# Patient Record
Sex: Female | Born: 1970
Health system: Southern US, Community
[De-identification: ages and names within clinical notes are randomized; demographics above are authoritative.]

## PROBLEM LIST (undated history)

## (undated) DIAGNOSIS — M8580 Other specified disorders of bone density and structure, unspecified site: Secondary | ICD-10-CM

## (undated) DIAGNOSIS — M545 Low back pain, unspecified: Secondary | ICD-10-CM

## (undated) DIAGNOSIS — J329 Chronic sinusitis, unspecified: Secondary | ICD-10-CM

## (undated) DIAGNOSIS — G35 Multiple sclerosis: Secondary | ICD-10-CM

## (undated) HISTORY — DX: Low back pain: M54.5

## (undated) HISTORY — DX: Chronic sinusitis, unspecified: J32.9

## (undated) HISTORY — DX: Other specified disorders of bone density and structure, unspecified site: M85.80

## (undated) HISTORY — DX: Low back pain, unspecified: M54.50

---

## 2001-02-24 ENCOUNTER — Other Ambulatory Visit: Admission: RE | Admit: 2001-02-24 | Discharge: 2001-02-24 | Payer: Self-pay | Admitting: Obstetrics and Gynecology

## 2002-03-06 ENCOUNTER — Inpatient Hospital Stay (HOSPITAL_COMMUNITY): Admission: AD | Admit: 2002-03-06 | Discharge: 2002-03-09 | Payer: Self-pay | Admitting: Obstetrics and Gynecology

## 2002-03-10 ENCOUNTER — Encounter: Admission: RE | Admit: 2002-03-10 | Discharge: 2002-04-09 | Payer: Self-pay | Admitting: Obstetrics and Gynecology

## 2002-04-21 ENCOUNTER — Other Ambulatory Visit: Admission: RE | Admit: 2002-04-21 | Discharge: 2002-04-21 | Payer: Self-pay | Admitting: Obstetrics and Gynecology

## 2002-05-10 ENCOUNTER — Encounter: Admission: RE | Admit: 2002-05-10 | Discharge: 2002-06-09 | Payer: Self-pay | Admitting: Obstetrics and Gynecology

## 2003-05-11 ENCOUNTER — Other Ambulatory Visit: Admission: RE | Admit: 2003-05-11 | Discharge: 2003-05-11 | Payer: Self-pay | Admitting: Obstetrics and Gynecology

## 2004-03-31 ENCOUNTER — Inpatient Hospital Stay: Payer: Self-pay | Admitting: Obstetrics and Gynecology

## 2006-01-06 ENCOUNTER — Ambulatory Visit: Payer: Self-pay | Admitting: Obstetrics and Gynecology

## 2006-08-07 ENCOUNTER — Ambulatory Visit: Payer: Self-pay | Admitting: Psychiatry

## 2008-08-23 ENCOUNTER — Ambulatory Visit: Payer: Self-pay

## 2009-08-24 ENCOUNTER — Encounter: Payer: Self-pay | Admitting: Internal Medicine

## 2009-09-12 ENCOUNTER — Encounter: Payer: Self-pay | Admitting: Internal Medicine

## 2009-10-12 ENCOUNTER — Encounter: Payer: Self-pay | Admitting: Internal Medicine

## 2009-11-09 ENCOUNTER — Ambulatory Visit: Payer: Self-pay

## 2009-11-12 ENCOUNTER — Encounter: Payer: Self-pay | Admitting: Internal Medicine

## 2009-12-13 ENCOUNTER — Encounter: Payer: Self-pay | Admitting: Internal Medicine

## 2010-02-12 ENCOUNTER — Encounter: Payer: Self-pay | Admitting: Internal Medicine

## 2010-04-14 ENCOUNTER — Encounter: Payer: Self-pay | Admitting: Internal Medicine

## 2011-06-25 ENCOUNTER — Ambulatory Visit (INDEPENDENT_AMBULATORY_CARE_PROVIDER_SITE_OTHER): Payer: PRIVATE HEALTH INSURANCE | Admitting: Internal Medicine

## 2011-06-25 ENCOUNTER — Encounter: Payer: Self-pay | Admitting: Internal Medicine

## 2011-06-25 VITALS — BP 142/80 | HR 80 | Temp 98.3°F | Ht 68.0 in | Wt 169.0 lb

## 2011-06-25 DIAGNOSIS — R350 Frequency of micturition: Secondary | ICD-10-CM

## 2011-06-25 DIAGNOSIS — N390 Urinary tract infection, site not specified: Secondary | ICD-10-CM | POA: Insufficient documentation

## 2011-06-25 LAB — POCT URINALYSIS DIPSTICK
Bilirubin, UA: NEGATIVE
Glucose, UA: NEGATIVE
Nitrite, UA: NEGATIVE
Spec Grav, UA: 1.015
Urobilinogen, UA: 0.2

## 2011-06-25 MED ORDER — PHENAZOPYRIDINE HCL 200 MG PO TABS: 200.0000 mg | ORAL_TABLET | Freq: Three times a day (TID) | ORAL | Status: AC | PRN

## 2011-06-25 MED ORDER — SULFAMETHOXAZOLE-TRIMETHOPRIM 800-160 MG PO TABS: 1.0000 | ORAL_TABLET | Freq: Two times a day (BID) | ORAL | Status: AC

## 2011-06-25 NOTE — Assessment & Plan Note (Signed)
Symptoms and urinalysis consistent with UTI.  Will treat with bactrim and use pyridium as needed for pain. Pt is travelling today to [place], but we will email with culture results.  Follow up 2 months.

## 2011-06-25 NOTE — Progress Notes (Signed)
  Subjective:    Patient ID: Maria Jennings, female    DOB: March 11, 1971, 41 y.o.   MRN: [id number]  HPI 41YO female present for acute visit c/o suprapubic pain/pressure x 2 days. Denies fever, chills, flank pain, dysuria.  Has not taken any medications for this.  Outpatient Encounter Prescriptions as of 06/25/2011  Medication Sig Dispense Refill  . phenazopyridine (PYRIDIUM) 200 MG tablet Take 1 tablet (200 mg total) by mouth 3 (three) times daily as needed for pain.  10 tablet  0  . sulfamethoxazole-trimethoprim (BACTRIM DS,SEPTRA DS) 800-160 MG per tablet Take 1 tablet by mouth 2 (two) times daily.  20 tablet  0    Review of Systems  Constitutional: Negative for fever, chills, appetite change, fatigue and unexpected weight change.  Eyes: Negative for visual disturbance.  Respiratory: Negative for shortness of breath.   Gastrointestinal: Positive for abdominal pain. Negative for nausea, vomiting, diarrhea, constipation, blood in stool, abdominal distention and anal bleeding.  Genitourinary: Negative for dysuria, urgency, frequency, flank pain and decreased urine volume.  Musculoskeletal: Negative for myalgias, arthralgias and gait problem.  Skin: Negative for color change and rash.  Neurological: Negative for dizziness and headaches.   BP 142/80  Pulse 80  Temp(Src) 98.3 F (36.8 C) (Oral)  Ht 5\' 8"  (1.727 m)  Wt 169 lb (76.658 kg)  BMI 25.70 kg/m2  SpO2 100%     Objective:   Physical Exam  Constitutional: She is oriented to person, place, and time. She appears well-developed and well-nourished. No distress.  HENT:  Head: Normocephalic and atraumatic.  Right Ear: External ear normal.  Left Ear: External ear normal.  Nose: Nose normal.  Mouth/Throat: Oropharynx is clear and moist. No oropharyngeal exudate.  Eyes: Conjunctivae are normal. Pupils are equal, round, and reactive to light. Right eye exhibits no discharge. Left eye exhibits no discharge. No scleral icterus.  Neck:  Normal range of motion. Neck supple. No tracheal deviation present. No thyromegaly present.  Cardiovascular: Normal rate, regular rhythm, normal heart sounds and intact distal pulses.  Exam reveals no gallop and no friction rub.   No murmur heard. Pulmonary/Chest: Effort normal and breath sounds normal. No respiratory distress. She has no wheezes. She has no rales.  Abdominal: Soft. She exhibits no distension. There is tenderness (suprapubic). There is no guarding.  Musculoskeletal: Normal range of motion. She exhibits no edema and no tenderness.  Lymphadenopathy:    She has no cervical adenopathy.  Neurological: She is alert and oriented to person, place, and time. No cranial nerve deficit. She exhibits normal muscle tone. Coordination normal.  Skin: Skin is warm and dry. No rash noted. She is not diaphoretic. No erythema. No pallor.  Psychiatric: She has a normal mood and affect. Her behavior is normal. Judgment and thought content normal.          Assessment & Plan:

## 2011-06-28 LAB — CULTURE, URINE COMPREHENSIVE: Colony Count: >=100000

## 2011-07-07 ENCOUNTER — Encounter: Payer: Self-pay | Admitting: Internal Medicine

## 2011-07-18 LAB — HM MAMMOGRAPHY: HM Mammogram: NORMAL

## 2011-09-17 ENCOUNTER — Ambulatory Visit: Payer: Self-pay | Admitting: Internal Medicine

## 2011-09-23 ENCOUNTER — Encounter: Payer: Self-pay | Admitting: Internal Medicine

## 2012-04-18 LAB — HM PAP SMEAR: HM Pap smear: NORMAL

## 2012-05-12 ENCOUNTER — Encounter: Payer: PRIVATE HEALTH INSURANCE | Admitting: Internal Medicine

## 2012-05-31 ENCOUNTER — Encounter: Payer: PRIVATE HEALTH INSURANCE | Admitting: Internal Medicine

## 2012-06-11 ENCOUNTER — Encounter: Payer: PRIVATE HEALTH INSURANCE | Admitting: Internal Medicine

## 2012-08-13 ENCOUNTER — Encounter: Payer: PRIVATE HEALTH INSURANCE | Admitting: Internal Medicine

## 2012-08-16 ENCOUNTER — Encounter: Payer: Self-pay | Admitting: Internal Medicine

## 2012-08-16 ENCOUNTER — Ambulatory Visit (INDEPENDENT_AMBULATORY_CARE_PROVIDER_SITE_OTHER): Payer: 59 | Admitting: Internal Medicine

## 2012-08-16 VITALS — BP 118/58 | HR 69 | Temp 98.1°F | Ht 69.0 in | Wt 166.0 lb

## 2012-08-16 DIAGNOSIS — Z0000 Encounter for general adult medical examination without abnormal findings: Secondary | ICD-10-CM

## 2012-08-16 DIAGNOSIS — Z0001 Encounter for general adult medical examination with abnormal findings: Secondary | ICD-10-CM | POA: Insufficient documentation

## 2012-08-16 LAB — CBC WITH DIFFERENTIAL/PLATELET
Basophils Absolute: 0.1 10*3/uL (ref 0.0–0.1)
Eosinophils Absolute: 0.3 10*3/uL (ref 0.0–0.7)
Lymphocytes Relative: 19.7 % (ref 12.0–46.0)
MCHC: 34.6 g/dL (ref 30.0–36.0)
Monocytes Relative: 5.9 % (ref 3.0–12.0)
Neutro Abs: 5 10*3/uL (ref 1.4–7.7)
Neutrophils Relative %: 68.8 % (ref 43.0–77.0)
Platelets: 199 10*3/uL (ref 150.0–400.0)
RDW: 12.5 % (ref 11.5–14.6)

## 2012-08-16 NOTE — Patient Instructions (Addendum)
[email or web address]  

## 2012-08-16 NOTE — Progress Notes (Signed)
  Subjective:    Patient ID: Maria Jennings, female    DOB: May 02, 1970, 42 y.o.   MRN: [id number]  HPI 42 year old female presents for annual exam. She reports she is generally feeling well. She recently returned from a trip to [place]. She continues to follow an active lifestyle. She has been running daily. She recently underwent pelvic exam, Pap smear, and breast exam with her gynecologist. No new concerns today.  Outpatient Encounter Prescriptions as of 08/16/2012  Medication Sig Dispense Refill  . Vitamin D, Ergocalciferol, (DRISDOL) 50000 UNITS CAPS Take 50,000 Units by mouth.       No facility-administered encounter medications on file as of 08/16/2012.   BP 140/96  Pulse 69  Temp(Src) 98.1 F (36.7 C) (Oral)  Ht 5\' 9"  (1.753 m)  Wt 166 lb (75.297 kg)  BMI 24.5 kg/m2  SpO2 97%  LMP 07/19/2012  Review of Systems  Constitutional: Negative for fever, chills, appetite change, fatigue and unexpected weight change.  HENT: Negative for ear pain, congestion, sore throat, trouble swallowing, neck pain, voice change and sinus pressure.   Eyes: Negative for visual disturbance.  Respiratory: Negative for cough, shortness of breath, wheezing and stridor.   Cardiovascular: Negative for chest pain, palpitations and leg swelling.  Gastrointestinal: Negative for nausea, vomiting, abdominal pain, diarrhea, constipation, blood in stool, abdominal distention and anal bleeding.  Genitourinary: Negative for dysuria and flank pain.  Musculoskeletal: Negative for myalgias, arthralgias and gait problem.  Skin: Negative for color change and rash.  Neurological: Negative for dizziness and headaches.  Hematological: Negative for adenopathy. Does not bruise/bleed easily.  Psychiatric/Behavioral: Negative for suicidal ideas, sleep disturbance and dysphoric mood. The patient is not nervous/anxious.        Objective:   Physical Exam  Constitutional: She is oriented to person, place, and time. She  appears well-developed and well-nourished. No distress.  HENT:  Head: Normocephalic and atraumatic.  Right Ear: External ear normal.  Left Ear: External ear normal.  Nose: Nose normal.  Mouth/Throat: Oropharynx is clear and moist. No oropharyngeal exudate.  Eyes: Conjunctivae are normal. Pupils are equal, round, and reactive to light. Right eye exhibits no discharge. Left eye exhibits no discharge. No scleral icterus.  Neck: Normal range of motion. Neck supple. No tracheal deviation present. No thyromegaly present.  Cardiovascular: Normal rate, regular rhythm, normal heart sounds and intact distal pulses.  Exam reveals no gallop and no friction rub.   No murmur heard. Pulmonary/Chest: Effort normal and breath sounds normal. No accessory muscle usage. Not tachypneic. No respiratory distress. She has no decreased breath sounds. She has no wheezes. She has no rhonchi. She has no rales. She exhibits no tenderness.  Abdominal: Soft. Bowel sounds are normal. She exhibits no distension and no mass. There is no tenderness. There is no rebound and no guarding.  Musculoskeletal: Normal range of motion. She exhibits no edema and no tenderness.  Lymphadenopathy:    She has no cervical adenopathy.  Neurological: She is alert and oriented to person, place, and time. No cranial nerve deficit. She exhibits normal muscle tone. Coordination normal.  Skin: Skin is warm and dry. No rash noted. She is not diaphoretic. No erythema. No pallor.  Psychiatric: She has a normal mood and affect. Her behavior is normal. Judgment and thought content normal.          Assessment & Plan:

## 2012-08-16 NOTE — Assessment & Plan Note (Signed)
General exam normal today. Breast exam and pelvic exam deferred as recently completed by GYN. Will check basic labs including CBC, CMP, lipids, Vit D. Will schedule mammogram. EKG normal today. Encouraged continued healthy lifestyle, with healthy diet and regular physical activity such as running. Follow up 1 year and prn.

## 2012-08-17 LAB — COMPREHENSIVE METABOLIC PANEL
ALT: 14 U/L (ref 0–35)
AST: 16 U/L (ref 0–37)
Alkaline Phosphatase: 57 U/L (ref 39–117)
BUN: 15 mg/dL (ref 6–23)
Chloride: 104 mEq/L (ref 96–112)
Creatinine, Ser: 0.5 mg/dL (ref 0.4–1.2)

## 2012-08-17 LAB — LIPID PANEL
HDL: 52.4 mg/dL (ref >39.00)
LDL Cholesterol: 118 mg/dL — ABNORMAL HIGH (ref 0–99)
Total CHOL/HDL Ratio: 3
Triglycerides: 43 mg/dL (ref 0.0–149.0)
VLDL: 8.6 mg/dL (ref 0.0–40.0)

## 2012-08-17 LAB — VITAMIN D 25 HYDROXY (VIT D DEFICIENCY, FRACTURES): Vit D, 25-Hydroxy: 24 ng/mL — ABNORMAL LOW (ref 30–89)

## 2012-08-18 ENCOUNTER — Encounter: Payer: Self-pay | Admitting: Emergency Medicine

## 2012-10-06 ENCOUNTER — Telehealth: Payer: Self-pay | Admitting: Internal Medicine

## 2012-10-06 ENCOUNTER — Ambulatory Visit (INDEPENDENT_AMBULATORY_CARE_PROVIDER_SITE_OTHER): Payer: 59 | Admitting: Internal Medicine

## 2012-10-06 ENCOUNTER — Encounter: Payer: Self-pay | Admitting: Internal Medicine

## 2012-10-06 VITALS — BP 142/92 | HR 69 | Temp 98.0°F | Wt 172.0 lb

## 2012-10-06 DIAGNOSIS — N390 Urinary tract infection, site not specified: Secondary | ICD-10-CM

## 2012-10-06 LAB — POCT URINALYSIS DIPSTICK
Leukocytes, UA: NEGATIVE
Nitrite, UA: NEGATIVE
Protein, UA: NEGATIVE
Urobilinogen, UA: 0.2
pH, UA: 8

## 2012-10-06 MED ORDER — SULFAMETHOXAZOLE-TRIMETHOPRIM 800-160 MG PO TABS: 1.0000 | ORAL_TABLET | Freq: Two times a day (BID) | ORAL | Status: DC

## 2012-10-06 MED ORDER — PHENAZOPYRIDINE HCL 95 MG PO TABS: 95.0000 mg | ORAL_TABLET | Freq: Three times a day (TID) | ORAL | Status: DC | PRN

## 2012-10-06 NOTE — Progress Notes (Signed)
  Subjective:    Patient ID: Maria Jennings, female    DOB: 13-Nov-1970, 42 y.o.   MRN: [id number]  HPI 42 year old female presents for acute visit complaining of three-day history of suprapubic pain and pressure. She denies any turning with urination, change in urinary urgency or frequency. She denies gross hematuria. She denies any fever or chills. Symptoms however are consistent with previous urinary tract infections. Over the weekend, she started taking Pyridium with some improvement.  Outpatient Encounter Prescriptions as of 10/06/2012  Medication Sig Dispense Refill  . Vitamin D, Ergocalciferol, (DRISDOL) 50000 UNITS CAPS Take 50,000 Units by mouth.       No facility-administered encounter medications on file as of 10/06/2012.   BP 142/92  Pulse 69  Temp(Src) 98 F (36.7 C) (Oral)  Wt 172 lb (78.019 kg)  BMI 25.39 kg/m2  SpO2 98%  LMP 09/18/2012  Review of Systems  Constitutional: Negative for fever, chills, appetite change, fatigue and unexpected weight change.  Eyes: Negative for visual disturbance.  Respiratory: Negative for shortness of breath.   Cardiovascular: Negative for chest pain.  Gastrointestinal: Positive for nausea. Negative for vomiting, abdominal pain, diarrhea, constipation, blood in stool, abdominal distention and anal bleeding.  Genitourinary: Positive for dysuria and pelvic pain. Negative for urgency, frequency, hematuria and flank pain.  Musculoskeletal: Negative for myalgias, arthralgias and gait problem.  Skin: Negative for color change and rash.  Neurological: Negative for dizziness and headaches.  Hematological: Negative for adenopathy. Does not bruise/bleed easily.  Psychiatric/Behavioral: Negative for suicidal ideas, sleep disturbance and dysphoric mood. The patient is not nervous/anxious.        Objective:   Physical Exam  Constitutional: She is oriented to person, place, and time. She appears well-developed and well-nourished. No distress.   HENT:  Head: Normocephalic and atraumatic.  Right Ear: External ear normal.  Left Ear: External ear normal.  Nose: Nose normal.  Mouth/Throat: Oropharynx is clear and moist. No oropharyngeal exudate.  Eyes: Conjunctivae are normal. Pupils are equal, round, and reactive to light. Right eye exhibits no discharge. Left eye exhibits no discharge. No scleral icterus.  Neck: Normal range of motion. Neck supple. No tracheal deviation present. No thyromegaly present.  Cardiovascular: Normal rate, regular rhythm, normal heart sounds and intact distal pulses.  Exam reveals no gallop and no friction rub.   No murmur heard. Pulmonary/Chest: Effort normal and breath sounds normal. No accessory muscle usage. Not tachypneic. No respiratory distress. She has no decreased breath sounds. She has no wheezes. She has no rhonchi. She has no rales. She exhibits no tenderness.  Abdominal: Soft. Bowel sounds are normal. She exhibits no distension. There is tenderness (subprapubic).  Musculoskeletal: Normal range of motion. She exhibits no edema and no tenderness.  Lymphadenopathy:    She has no cervical adenopathy.  Neurological: She is alert and oriented to person, place, and time. No cranial nerve deficit. She exhibits normal muscle tone. Coordination normal.  Skin: Skin is warm and dry. No rash noted. She is not diaphoretic. No erythema. No pallor.  Psychiatric: She has a normal mood and affect. Her behavior is normal. Judgment and thought content normal.          Assessment & Plan:

## 2012-10-06 NOTE — Telephone Encounter (Signed)
She can come in at 3:30

## 2012-10-06 NOTE — Telephone Encounter (Signed)
Would you like a urine culture?  

## 2012-10-06 NOTE — Telephone Encounter (Signed)
Patient has a UTI and she wants to be worked in this afternoon.

## 2012-10-06 NOTE — Telephone Encounter (Signed)
Offered patient an appointment but she can not come in until after 3 today. Having symptoms of an UTI

## 2012-10-06 NOTE — Assessment & Plan Note (Signed)
Symptoms are consistent with urinary tract infection. Will treat with Septra and Pyridium as needed for bladder pain or spasm. Encouraged increased fluid intake. Will send urine for culture. Patient will call if symptoms are not improving.

## 2012-10-06 NOTE — Telephone Encounter (Signed)
Yes, urine culture.

## 2012-10-18 ENCOUNTER — Ambulatory Visit: Payer: Self-pay | Admitting: Internal Medicine

## 2012-10-29 ENCOUNTER — Encounter: Payer: Self-pay | Admitting: Internal Medicine

## 2013-02-17 ENCOUNTER — Other Ambulatory Visit: Payer: Self-pay

## 2013-02-25 ENCOUNTER — Encounter: Payer: Self-pay | Admitting: Internal Medicine

## 2013-05-13 ENCOUNTER — Encounter: Payer: Self-pay | Admitting: Nurse Practitioner

## 2013-05-16 ENCOUNTER — Ambulatory Visit (INDEPENDENT_AMBULATORY_CARE_PROVIDER_SITE_OTHER): Payer: 59 | Admitting: Nurse Practitioner

## 2013-05-16 ENCOUNTER — Encounter: Payer: Self-pay | Admitting: Nurse Practitioner

## 2013-05-16 VITALS — BP 122/76 | HR 72 | Ht 68.0 in | Wt 177.0 lb

## 2013-05-16 DIAGNOSIS — E559 Vitamin D deficiency, unspecified: Secondary | ICD-10-CM

## 2013-05-16 DIAGNOSIS — Z01419 Encounter for gynecological examination (general) (routine) without abnormal findings: Secondary | ICD-10-CM

## 2013-05-16 DIAGNOSIS — Z0000 Encounter for general adult medical examination without abnormal findings: Secondary | ICD-10-CM

## 2013-05-16 LAB — POCT URINALYSIS DIPSTICK
BILIRUBIN UA: NEGATIVE
GLUCOSE UA: NEGATIVE
Ketones, UA: NEGATIVE
LEUKOCYTES UA: NEGATIVE
NITRITE UA: NEGATIVE
Protein, UA: NEGATIVE
RBC UA: NEGATIVE
UROBILINOGEN UA: NEGATIVE
pH, UA: 7

## 2013-05-16 MED ORDER — ORPHENADRINE CITRATE ER 100 MG PO TB12: 100.0000 mg | ORAL_TABLET | Freq: Two times a day (BID) | ORAL | Status: DC | PRN

## 2013-05-16 MED ORDER — VITAMIN D (ERGOCALCIFEROL) 1.25 MG (50000 UNIT) PO CAPS: 50000.0000 [IU] | ORAL_CAPSULE | ORAL | Status: DC

## 2013-05-16 NOTE — Patient Instructions (Signed)

## 2013-05-16 NOTE — Progress Notes (Signed)
Patient ID: Maria Jennings, female   DOB: 1971/02/05, 43 y.o.   MRN: [id number] 43 y.o. [id number] Married Caucasian Fe here for annual exam. Menses about 28 days. Last 3-4 days, no cramps, no PMS. Some low back pain and usually some pain at shoulder and neck area for 1 day either before or after onset of cycle.  Maybe occurs every other cycle. She has some ACL injury with right knee last year.  Not running as much.  Now from time to time has some ankle edema.  Just returned from a trip to [place] with the children to celebrate their 59 th wedding anniversary.  Still works in [place] at [employer].  Patient's last menstrual period was 05/03/2013.          Sexually active: yes  The current method of family planning is coitus interruptus.    Exercising: yes  Home exercise routine includes walking, biking and videos. Smoker:  no  Health Maintenance: Pap:  05/10/12, WNL, neg HR HPV MMG:  10/18/12, Bi-Rads 2: benign findings TDaP:  08/16/08 per EPIC history Labs:  HB:  PCP - Vit D is done here, Urine:  Negative, pH 7.0     reports that she has never smoked. She has never used smokeless tobacco. She reports that she drinks about 1.8 ounces of alcohol per week. She reports that she does not use illicit drugs.  Past Medical History  Diagnosis Date  . Sinusitis   . Vitamin D deficiency   . Lumbago     Past Surgical History  Procedure Laterality Date  . Cesarean section      x 1    Current Outpatient Prescriptions  Medication Sig Dispense Refill  . orphenadrine (NORFLEX) 100 MG tablet Take 1 tablet (100 mg total) by mouth 2 (two) times daily as needed for muscle spasms.  30 tablet  3  . Vitamin D, Ergocalciferol, (DRISDOL) 50000 UNITS CAPS capsule Take 1 capsule (50,000 Units total) by mouth every 7 (seven) days.  30 capsule  3   No current facility-administered medications for this visit.    Family History  Problem Relation Age of Onset  . Hypertension Mother   . Hyperlipidemia Mother   .  Hypertension Father   . Cancer Maternal Grandmother 60    colon  . Cancer Cousin 52    colon    ROS:  Pertinent items are noted in HPI.  Otherwise, a comprehensive ROS was negative.  Exam:   BP 122/76  Pulse 72  Ht 5\' 8"  (1.727 m)  Wt 177 lb (80.287 kg)  BMI 26.92 kg/m2  LMP 05/03/2013 Height: 5\' 8"  (172.7 cm)  Ht Readings from Last 3 Encounters:  05/16/13 5\' 8"  (1.727 m)  08/16/12 5\' 9"  (1.753 m)  06/25/11 5\' 8"  (1.727 m)    General appearance: alert, cooperative and appears stated age Head: Normocephalic, without obvious abnormality, atraumatic Neck: no adenopathy, supple, symmetrical, trachea midline and thyroid normal to inspection and palpation Lungs: clear to auscultation bilaterally Breasts: normal appearance, no masses or tenderness Heart: regular rate and rhythm Abdomen: soft, non-tender; no masses,  no organomegaly Extremities: extremities normal, atraumatic, no cyanosis and no edema Skin: Skin color, texture, turgor normal. No rashes or lesions Lymph nodes: Cervical, supraclavicular, and axillary nodes normal. No abnormal inguinal nodes palpated Neurologic: Grossly normal   Pelvic: External genitalia:  no lesions              Urethra:  normal appearing urethra with no masses, tenderness  or lesions              Bartholin's and Skene's: normal                 Vagina: normal appearing vagina with normal color and discharge, no lesions              Cervix: anteverted              Pap taken: no Bimanual Exam:  Uterus:  normal size, contour, position, consistency, mobility, non-tender              Adnexa: no mass, fullness, tenderness               Rectovaginal: Confirms               Anus:  normal sphincter tone, no lesions  A:  Well Woman with normal exam  Normal menses  Withdrawal for birth control  History of Vit D deficiency  P:   Pap smear as per guidelines   Mammogram due 10/2013  Refill Vit D and will follow with labs  Refill Norflex 100 mg to take  prn for pain associated with back during menses - only uses maybe 1 tablet every other month.  Counseled on breast self exam, mammography screening, adequate intake of calcium and vitamin D, diet and exercise return annually or prn  An After Visit Summary was printed and given to the patient.

## 2013-05-17 ENCOUNTER — Telehealth: Payer: Self-pay | Admitting: *Deleted

## 2013-05-17 LAB — VITAMIN D 25 HYDROXY (VIT D DEFICIENCY, FRACTURES): VIT D 25 HYDROXY: 31 ng/mL (ref 30–89)

## 2013-05-17 NOTE — Telephone Encounter (Signed)
Message copied by Graylon Good on Tue May 17, 2013  2:05 PM ------      Message from: Kem Boroughs R      Created: Tue May 17, 2013  8:18 AM       Let patietn know that Vit D is improved from 9 months ago from 55 - to 92.  Still needs Vit D RX ------

## 2013-05-17 NOTE — Telephone Encounter (Signed)
Pt notified in result note.  

## 2013-05-17 NOTE — Telephone Encounter (Signed)
I have attempted to contact this patient by phone with the following results: left message to return my call on answering machine (home). Mobile phone voicemail not set up at this time.

## 2013-05-18 NOTE — Progress Notes (Signed)
Encounter reviewed by Dr. Brook Silva.  

## 2013-06-06 ENCOUNTER — Ambulatory Visit: Payer: Self-pay | Admitting: Orthopedic Surgery

## 2013-07-04 ENCOUNTER — Ambulatory Visit: Payer: Self-pay | Admitting: Orthopedic Surgery

## 2013-07-04 LAB — BASIC METABOLIC PANEL
ANION GAP: 3 — AB (ref 7–16)
BUN: 11 mg/dL (ref 7–18)
CHLORIDE: 104 mmol/L (ref 98–107)
CO2: 29 mmol/L (ref 21–32)
Calcium, Total: 8.8 mg/dL (ref 8.5–10.1)
Creatinine: 0.57 mg/dL — ABNORMAL LOW (ref 0.60–1.30)
EGFR (African American): >60
EGFR (Non-African Amer.): >60
Glucose: 78 mg/dL (ref 65–99)
OSMOLALITY: 270 (ref 275–301)
Potassium: 3.6 mmol/L (ref 3.5–5.1)
Sodium: 136 mmol/L (ref 136–145)

## 2013-07-04 LAB — URINALYSIS, COMPLETE
Bilirubin,UR: NEGATIVE
Blood: NEGATIVE
GLUCOSE, UR: NEGATIVE mg/dL (ref 0–75)
Ketone: NEGATIVE
Leukocyte Esterase: NEGATIVE
Nitrite: NEGATIVE
PROTEIN: NEGATIVE
Ph: 5 (ref 4.5–8.0)
RBC,UR: 1 /HPF (ref 0–5)
Specific Gravity: 1.011 (ref 1.003–1.030)
Squamous Epithelial: 6
WBC UR: <1 /HPF (ref 0–5)

## 2013-07-04 LAB — CBC
HCT: 40.4 % (ref 35.0–47.0)
HGB: 13.8 g/dL (ref 12.0–16.0)
MCH: 31.7 pg (ref 26.0–34.0)
MCHC: 34.3 g/dL (ref 32.0–36.0)
MCV: 93 fL (ref 80–100)
Platelet: 210 10*3/uL (ref 150–440)
RBC: 4.37 10*6/uL (ref 3.80–5.20)
RDW: 13.3 % (ref 11.5–14.5)
WBC: 6.2 10*3/uL (ref 3.6–11.0)

## 2013-07-04 LAB — APTT: Activated PTT: 29.2 secs (ref 23.6–35.9)

## 2013-07-04 LAB — PROTIME-INR
INR: 1
Prothrombin Time: 12.9 secs (ref 11.5–14.7)

## 2013-07-13 HISTORY — PX: KNEE ARTHROSCOPY: SHX127

## 2013-07-14 ENCOUNTER — Ambulatory Visit: Payer: Self-pay | Admitting: Orthopedic Surgery

## 2013-09-30 ENCOUNTER — Telehealth: Payer: Self-pay | Admitting: Internal Medicine

## 2013-09-30 ENCOUNTER — Ambulatory Visit (INDEPENDENT_AMBULATORY_CARE_PROVIDER_SITE_OTHER): Payer: 59 | Admitting: Internal Medicine

## 2013-09-30 ENCOUNTER — Encounter: Payer: Self-pay | Admitting: Internal Medicine

## 2013-09-30 VITALS — BP 110/76 | HR 81 | Temp 98.0°F | Wt 170.0 lb

## 2013-09-30 DIAGNOSIS — J019 Acute sinusitis, unspecified: Secondary | ICD-10-CM

## 2013-09-30 DIAGNOSIS — B9689 Other specified bacterial agents as the cause of diseases classified elsewhere: Secondary | ICD-10-CM

## 2013-09-30 MED ORDER — AMOXICILLIN-POT CLAVULANATE 875-125 MG PO TABS: 1.0000 | ORAL_TABLET | Freq: Two times a day (BID) | ORAL | Status: DC

## 2013-09-30 NOTE — Progress Notes (Signed)
HPI  Pt presents to the clinic today with c/o head congestion, facial pain and pressure, ear fullness, post nasal drainage and chills. This started about 10 days ago. She denies fever or body aches. She has tried Sudafed and  [employer] with minimal relief. She has no history of seasonal allergies. She has not had sick contacts that she is aware of. She does not smoke.  Review of Systems    Past Medical History  Diagnosis Date  . Sinusitis   . Vitamin D deficiency   . Lumbago     Family History  Problem Relation Age of Onset  . Hypertension Mother   . Hyperlipidemia Mother   . Hypertension Father   . Cancer Maternal Grandmother 55    colon  . Cancer Cousin 59    colon    History   Social History  . Marital Status: Married    Spouse Name: N/A    Number of Children: 2  . Years of Education: N/A   Occupational History  .     Social History Main Topics  . Smoking status: Never Smoker   . Smokeless tobacco: Never Used  . Alcohol Use: 1.8 - 2.4 oz/week    3-4 Glasses of wine per week     Comment: occasional  . Drug Use: No  . Sexual Activity: Yes    Partners: Male    Birth Control/ Protection: Coitus interruptus   Other Topics Concern  . Not on file   Social History Narrative   Lives in [place].      Work - rehab [employer]    Allergies  Allergen Reactions  . Azithromycin Hives  . Ciprofloxacin Hcl Rash     Constitutional: Positive headache, fatigue. Denies fever or abrupt weight changes.  HEENT:  Positive facial pain, nasal congestion and sore throat. Denies eye redness, ear pain, ringing in the ears, wax buildup, runny nose or bloody nose. Respiratory: Positive cough. Denies difficulty breathing or shortness of breath.  Cardiovascular: Denies chest pain, chest tightness, palpitations or swelling in the hands or feet.   No other specific complaints in a complete review of systems (except as listed in HPI above).  Objective:  BP 110/76  Pulse 81  Temp(Src)  98 F (36.7 C) (Oral)  Wt 170 lb (77.111 kg)  SpO2 99%   General: Appears her stated age, well developed, well nourished in NAD. HEENT: Head: normal shape and size, left maxillary sinus tenderness noted; Eyes: sclera white, no icterus, conjunctiva pink, PERRLA and EOMs intact; Ears: Tm's gray and intact, normal light reflex; Nose: mucosa pink and moist, septum midline; Throat/Mouth: + PND. Teeth present, mucosa pink and moist, no exudate noted, no lesions or ulcerations noted.  Neck: Neck supple, trachea midline. No massses, lumps or thyromegaly present.  Cardiovascular: Normal rate and rhythm. S1,S2 noted.  No murmur, rubs or gallops noted. No JVD or BLE edema. No carotid bruits noted. Pulmonary/Chest: Normal effort and positive vesicular breath sounds. No respiratory distress. No wheezes, rales or ronchi noted.      Assessment & Plan:   Acute bacterial sinusitis  Can use a Neti Pot which can be purchased from your local drug store. Flonase 2 sprays each nostril for 3 days and then as needed. Augmentin BID for 10 days  RTC as needed or if symptoms persist.

## 2013-09-30 NOTE — Progress Notes (Signed)
Pre visit review using our clinic review tool, if applicable. No additional management support is needed unless otherwise documented below in the visit note. 

## 2013-09-30 NOTE — Patient Instructions (Addendum)
Sinusitis Sinusitis is redness, soreness, and swelling (inflammation) of the paranasal sinuses. Paranasal sinuses are air pockets within the bones of your face (beneath the eyes, the middle of the forehead, or above the eyes). In healthy paranasal sinuses, mucus is able to drain out, and air is able to circulate through them by way of your nose. However, when your paranasal sinuses are inflamed, mucus and air can become trapped. This can allow bacteria and other germs to grow and cause infection. Sinusitis can develop quickly and last only a short time (acute) or continue over a long period (chronic). Sinusitis that lasts for more than 12 weeks is considered chronic.  CAUSES  Causes of sinusitis include:  Allergies.  Structural abnormalities, such as displacement of the cartilage that separates your nostrils (deviated septum), which can decrease the air flow through your nose and sinuses and affect sinus drainage.  Functional abnormalities, such as when the small hairs (cilia) that line your sinuses and help remove mucus do not work properly or are not present. SYMPTOMS  Symptoms of acute and chronic sinusitis are the same. The primary symptoms are pain and pressure around the affected sinuses. Other symptoms include:  Upper toothache.  Earache.  Headache.  Bad breath.  Decreased sense of smell and taste.  A cough, which worsens when you are lying flat.  Fatigue.  Fever.  Thick drainage from your nose, which often is green and may contain pus (purulent).  Swelling and warmth over the affected sinuses. DIAGNOSIS  Your caregiver will perform a physical exam. During the exam, your caregiver may:  Look in your nose for signs of abnormal growths in your nostrils (nasal polyps).  Tap over the affected sinus to check for signs of infection.  View the inside of your sinuses (endoscopy) with a special imaging device with a light attached (endoscope), which is inserted into your  sinuses. If your caregiver suspects that you have chronic sinusitis, one or more of the following tests may be recommended:  Allergy tests.  Nasal culture--A sample of mucus is taken from your nose and sent to a lab and screened for bacteria.  Nasal cytology--A sample of mucus is taken from your nose and examined by your caregiver to determine if your sinusitis is related to an allergy. TREATMENT  Most cases of acute sinusitis are related to a viral infection and will resolve on their own within 10 days. Sometimes medicines are prescribed to help relieve symptoms (pain medicine, decongestants, nasal steroid sprays, or saline sprays).  However, for sinusitis related to a bacterial infection, your caregiver will prescribe antibiotic medicines. These are medicines that will help kill the bacteria causing the infection.  Rarely, sinusitis is caused by a fungal infection. In theses cases, your caregiver will prescribe antifungal medicine. For some cases of chronic sinusitis, surgery is needed. Generally, these are cases in which sinusitis recurs more than 3 times per year, despite other treatments. HOME CARE INSTRUCTIONS   Drink plenty of water. Water helps thin the mucus so your sinuses can drain more easily.  Use a humidifier.  Inhale steam 3 to 4 times a day (for example, sit in the bathroom with the shower running).  Apply a warm, moist washcloth to your face 3 to 4 times a day, or as directed by your caregiver.  Use saline nasal sprays to help moisten and clean your sinuses.  Take over-the-counter or prescription medicines for pain, discomfort, or fever only as directed by your caregiver. SEEK IMMEDIATE MEDICAL CARE IF:    You have increasing pain or severe headaches.  You have nausea, vomiting, or drowsiness.  You have swelling around your face.  You have vision problems.  You have a stiff neck.  You have difficulty breathing. MAKE SURE YOU:   Understand these  instructions.  Will watch your condition.  Will get help right away if you are not doing well or get worse. Document Released: 03/31/2005 Document Revised: 06/23/2011 Document Reviewed: 04/15/2011 Exit[employer] Patient Information 2015 [employer]. This information is not intended to replace advice given to you by your health care provider. Make sure you discuss any questions you have with your health care provider.  

## 2013-09-30 NOTE — Telephone Encounter (Signed)
Patient Information:  Caller Name: Ahnya  Phone: [phone]  Patient: Maria Jennings  Gender: Female  DOB: 24-Apr-1970  Age: 43 Years  PCP: Ronette Deter (Adults only)  Pregnant: No  Office Follow Up:  Does the office need to follow up with this patient?: No  Instructions For The Office: N/A  RN Note:  Requesting appt after noon and before 4 today  Symptoms  Reason For Call & Symptoms: Pt is calling and states that she has congestion, sore throat and fever last night;  no fever today; sx on and off for the last 2 weeks; ears will hurt on and off along with neck; feels like a sinus infection  had a tick bite last week but no bullseye rash;  Reviewed Health History In EMR: Yes  Reviewed Medications In EMR: Yes  Reviewed Allergies In EMR: Yes  Reviewed Surgeries / Procedures: Yes  Date of Onset of Symptoms: 09/16/2013  Treatments Tried: Sudafed and Motrin  Treatments Tried Worked: No OB / GYN:  LMP: 09/09/2013  Guideline(s) Used:  Sinus Pain and Congestion  Disposition Per Guideline:   See Today or Tomorrow in Office  Reason For Disposition Reached:   Sinus congestion (pressure, fullness) present > 10 days  Advice Given:  Call Back If:   You become worse.  Patient Will Follow Care Advice:  YES  Appointment Scheduled:  09/30/2013 13:30:00 Appointment Scheduled Provider:  Other Pt requesting appt after noon and before 4.  No appt at [facility].  Offered another location and pt requested [facility]; Appt made with [facility]

## 2013-10-06 ENCOUNTER — Telehealth: Payer: Self-pay | Admitting: *Deleted

## 2013-10-06 ENCOUNTER — Other Ambulatory Visit: Payer: Self-pay | Admitting: Internal Medicine

## 2013-10-06 MED ORDER — PREDNISONE 10 MG PO TABS: ORAL_TABLET | ORAL | Status: DC

## 2013-10-06 NOTE — Telephone Encounter (Signed)
Pt called states she has been taking antibiotic for 6 days with no relief.  Requests whether she needs to be seen again or if another Rx can be called in.  Please advise

## 2013-10-06 NOTE — Telephone Encounter (Signed)
Spoke with pt, advised Rx added

## 2013-10-06 NOTE — Telephone Encounter (Signed)
Will add prednisone to see if this will help

## 2013-10-17 ENCOUNTER — Ambulatory Visit (INDEPENDENT_AMBULATORY_CARE_PROVIDER_SITE_OTHER): Payer: 59 | Admitting: Internal Medicine

## 2013-10-17 ENCOUNTER — Encounter: Payer: Self-pay | Admitting: Internal Medicine

## 2013-10-17 VITALS — BP 122/82 | HR 93 | Temp 98.3°F | Ht 69.0 in

## 2013-10-17 DIAGNOSIS — J320 Chronic maxillary sinusitis: Secondary | ICD-10-CM

## 2013-10-17 DIAGNOSIS — R509 Fever, unspecified: Secondary | ICD-10-CM

## 2013-10-17 MED ORDER — DOXYCYCLINE HYCLATE 100 MG PO TABS: 100.0000 mg | ORAL_TABLET | Freq: Two times a day (BID) | ORAL | Status: DC

## 2013-10-17 MED ORDER — LORATADINE-PSEUDOEPHEDRINE ER 5-120 MG PO TB12: 1.0000 | ORAL_TABLET | Freq: Two times a day (BID) | ORAL | Status: DC

## 2013-10-17 NOTE — Assessment & Plan Note (Signed)
Patient with 3 weeks of fever, headache, and malaise in setting of tick bite. Will treat empirically for RMSF with Doxycycline. Continue Aleve prn. Will check RMSF, erhlichia antibody and CBC.

## 2013-10-17 NOTE — Progress Notes (Signed)
Subjective:    Patient ID: Maria Jennings, female    DOB: 21-Jul-1970, 43 y.o.   MRN: [id number]  HPI 43YO female presents for acute visit.  6/19 Seen at [facility] for 1 week of sore throat and sinus pain. Treated with Augmentin for sinusitis. No improvement, so added Prednisone. Once stopped Prednisone last week, starting having recurrent sore throat, fever, chills. Persistent left sided cheek and forehead pain and sinus drainage.  Some improvement in pain with Aleve. Fever 101F today. Had Tick bite on 6/10. No rash. Diffuse headache noted, which has been persistent No cough, shortness of breath. No known sick contacts.  Review of Systems  Constitutional: Positive for fever, chills and fatigue. Negative for unexpected weight change.  HENT: Positive for congestion, postnasal drip, rhinorrhea, sinus pressure and sore throat. Negative for ear discharge, ear pain, facial swelling, hearing loss, mouth sores, nosebleeds, sneezing, tinnitus, trouble swallowing and voice change.   Eyes: Negative for pain, discharge, redness and visual disturbance.  Respiratory: Negative for cough, chest tightness, shortness of breath, wheezing and stridor.   Cardiovascular: Negative for chest pain, palpitations and leg swelling.  Musculoskeletal: Positive for myalgias. Negative for arthralgias, neck pain and neck stiffness.  Skin: Negative for color change and rash.  Neurological: Positive for headaches. Negative for dizziness, weakness and light-headedness.  Hematological: Negative for adenopathy.  Psychiatric/Behavioral: Negative for sleep disturbance and dysphoric mood. The patient is not nervous/anxious.        Objective:    BP 122/82  Pulse 93  Temp(Src) 98.3 F (36.8 C) (Oral)  Ht 5\' 9"  (1.753 m)  SpO2 98%  LMP 10/17/2013 Physical Exam  Constitutional: She is oriented to person, place, and time. She appears well-developed and well-nourished. No distress.  HENT:  Head: Normocephalic and  atraumatic.  Right Ear: Tympanic membrane and external ear normal.  Left Ear: External ear normal. Tympanic membrane is erythematous. A middle ear effusion is present.  Nose: Nose normal. Right sinus exhibits no maxillary sinus tenderness and no frontal sinus tenderness. Left sinus exhibits no maxillary sinus tenderness and no frontal sinus tenderness.  Mouth/Throat: Posterior oropharyngeal erythema (mild) present. No oropharyngeal exudate.  Eyes: Conjunctivae are normal. Pupils are equal, round, and reactive to light. Right eye exhibits no discharge. Left eye exhibits no discharge. No scleral icterus.  Neck: Normal range of motion. Neck supple. No tracheal deviation present. No thyromegaly present.  Cardiovascular: Normal rate, regular rhythm, normal heart sounds and intact distal pulses.  Exam reveals no gallop and no friction rub.   No murmur heard. Pulmonary/Chest: Effort normal and breath sounds normal. No accessory muscle usage. Not tachypneic. No respiratory distress. She has no decreased breath sounds. She has no wheezes. She has no rhonchi. She has no rales. She exhibits no tenderness.  Musculoskeletal: Normal range of motion. She exhibits no edema and no tenderness.  Lymphadenopathy:    She has no cervical adenopathy.  Neurological: She is alert and oriented to person, place, and time. No cranial nerve deficit. She exhibits normal muscle tone. Coordination normal.  Skin: Skin is warm and dry. No rash noted. She is not diaphoretic. No erythema. No pallor.  Psychiatric: She has a normal mood and affect. Her behavior is normal. Judgment and thought content normal.          Assessment & Plan:   Problem List Items Addressed This Visit     Unprioritized   Fever, unspecified - Primary     Patient with 3 weeks of fever,  headache, and malaise in setting of tick bite. Will treat empirically for RMSF with Doxycycline. Continue Aleve prn. Will check RMSF, erhlichia antibody and CBC.      Relevant Medications      DOXYCYCLINE HYCLATE 100 MG PO TABS   Other Relevant Orders      CBC w/Diff      Rocky mtn spotted fvr ab, IgM-blood      Ehrlichia antibody panel      TSH      Comprehensive metabolic panel   Left maxillary sinusitis     Symptoms suggestive of left maxillary sinusitis, however minimal improvement with Augmentin and Prednisone taper. Will start Doxycycline both for coverage of tick borne illness and sinusitis. If no improvement next 72 hr, discussed referral to ENT for direct visualization.    Relevant Medications      DOXYCYCLINE HYCLATE 100 MG PO TABS      loratadine-pseudoephedrine (CLARITIN-D 12-HOUR) 5-120 MG per tablet       Return in about 1 week (around 10/24/2013) for Recheck.

## 2013-10-17 NOTE — Progress Notes (Signed)
Pre visit review using our clinic review tool, if applicable. No additional management support is needed unless otherwise documented below in the visit note. 

## 2013-10-17 NOTE — Assessment & Plan Note (Signed)
Symptoms suggestive of left maxillary sinusitis, however minimal improvement with Augmentin and Prednisone taper. Will start Doxycycline both for coverage of tick borne illness and sinusitis. If no improvement next 72 hr, discussed referral to ENT for direct visualization.

## 2013-10-17 NOTE — Patient Instructions (Signed)
Start Clartin - D 12hour during the day. Take Benadyl if needed at night.  Use Aleve 12hr twice daily.  Start Doxycycline 100mg  twice daily.  Labs today.  Email with update on Wednesday.  [email or web address]

## 2013-10-18 LAB — CBC WITH DIFFERENTIAL/PLATELET
Basophils Absolute: 0 10*3/uL (ref 0.0–0.1)
Basophils Relative: 0.6 % (ref 0.0–3.0)
Eosinophils Absolute: 0.2 10*3/uL (ref 0.0–0.7)
Eosinophils Relative: 3.1 % (ref 0.0–5.0)
HCT: 35 % — ABNORMAL LOW (ref 36.0–46.0)
Hemoglobin: 11.8 g/dL — ABNORMAL LOW (ref 12.0–15.0)
LYMPHS PCT: 19.7 % (ref 12.0–46.0)
Lymphs Abs: 1.5 10*3/uL (ref 0.7–4.0)
MCHC: 33.6 g/dL (ref 30.0–36.0)
MCV: 92.3 fl (ref 78.0–100.0)
MONOS PCT: 6.8 % (ref 3.0–12.0)
Monocytes Absolute: 0.5 10*3/uL (ref 0.1–1.0)
Neutro Abs: 5.1 10*3/uL (ref 1.4–7.7)
Neutrophils Relative %: 69.8 % (ref 43.0–77.0)
Platelets: 291 10*3/uL (ref 150.0–400.0)
RBC: 3.79 Mil/uL — ABNORMAL LOW (ref 3.87–5.11)
RDW: 12.3 % (ref 11.5–15.5)
WBC: 7.4 10*3/uL (ref 4.0–10.5)

## 2013-10-18 LAB — COMPREHENSIVE METABOLIC PANEL
ALK PHOS: 89 U/L (ref 39–117)
ALT: 14 U/L (ref 0–35)
AST: 13 U/L (ref 0–37)
Albumin: 3.2 g/dL — ABNORMAL LOW (ref 3.5–5.2)
BUN: 13 mg/dL (ref 6–23)
CALCIUM: 9.2 mg/dL (ref 8.4–10.5)
CO2: 28 mEq/L (ref 19–32)
CREATININE: 0.5 mg/dL (ref 0.4–1.2)
Chloride: 104 mEq/L (ref 96–112)
GFR: 141.54 mL/min (ref >60.00)
Glucose, Bld: 121 mg/dL — ABNORMAL HIGH (ref 70–99)
Potassium: 4.3 mEq/L (ref 3.5–5.1)
Sodium: 137 mEq/L (ref 135–145)
Total Bilirubin: 0.1 mg/dL — ABNORMAL LOW (ref 0.2–1.2)
Total Protein: 6.3 g/dL (ref 6.0–8.3)

## 2013-10-18 LAB — ROCKY MTN SPOTTED FVR AB, IGM-BLOOD: ROCKY MTN SPOTTED FEVER, IGM: 0.18 IV

## 2013-10-18 LAB — TSH: TSH: 0.01 u[IU]/mL — ABNORMAL LOW (ref 0.35–4.50)

## 2013-10-19 ENCOUNTER — Telehealth: Payer: Self-pay | Admitting: Internal Medicine

## 2013-10-19 ENCOUNTER — Ambulatory Visit: Payer: Self-pay | Admitting: Internal Medicine

## 2013-10-19 LAB — HM MAMMOGRAPHY

## 2013-10-19 NOTE — Telephone Encounter (Signed)
Mammogram abnormal.

## 2013-10-21 ENCOUNTER — Ambulatory Visit: Payer: Self-pay | Admitting: Internal Medicine

## 2013-10-21 LAB — EHRLICHIA ANTIBODY PANEL: E chaffeensis (HGE) Ab, IgG: <1:64 {titer}

## 2013-10-21 LAB — HM MAMMOGRAPHY: HM Mammogram: NEGATIVE

## 2013-10-24 ENCOUNTER — Encounter: Payer: Self-pay | Admitting: *Deleted

## 2013-10-26 ENCOUNTER — Other Ambulatory Visit: Payer: Self-pay | Admitting: Internal Medicine

## 2013-10-26 DIAGNOSIS — J320 Chronic maxillary sinusitis: Secondary | ICD-10-CM

## 2013-10-31 ENCOUNTER — Other Ambulatory Visit: Payer: Self-pay | Admitting: Internal Medicine

## 2013-10-31 DIAGNOSIS — H538 Other visual disturbances: Secondary | ICD-10-CM

## 2013-11-01 ENCOUNTER — Encounter: Payer: Self-pay | Admitting: Internal Medicine

## 2013-11-01 ENCOUNTER — Ambulatory Visit: Payer: Self-pay | Admitting: Internal Medicine

## 2013-11-01 ENCOUNTER — Telehealth: Payer: Self-pay | Admitting: Internal Medicine

## 2013-11-01 DIAGNOSIS — H539 Unspecified visual disturbance: Secondary | ICD-10-CM

## 2013-11-01 NOTE — Telephone Encounter (Signed)
FYI.  Received pts MRI (brain).  Was performed because of persistent intermittent blurred vision.  MRI revealed multiple supratentorial white matter lesions, consistent with demyelinating disease (multiple sclerosis).  No enhancing lesions seen.  I spoke to Dr Manuella Ghazi and he reviewed the scan.  Agreed with radiologist interpretation.  I called and spoke to pt - explaining the scan results and plan for appointment with Dr Manuella Ghazi.  She reports symptoms are unchanged.  Still with intermittent blurred vision.  Agreed to appointment with Dr Manuella Ghazi.  Order placed.  Dr Manuella Ghazi to see her this week.     Maria Jennings.

## 2013-11-01 NOTE — Telephone Encounter (Signed)
Thanks for following up on this scan. I will be back in the office Friday and I will be able to look at the imaging then.

## 2013-11-04 DIAGNOSIS — G35D Multiple sclerosis, unspecified: Secondary | ICD-10-CM | POA: Insufficient documentation

## 2013-11-04 DIAGNOSIS — G35 Multiple sclerosis: Secondary | ICD-10-CM | POA: Insufficient documentation

## 2013-11-04 DIAGNOSIS — H469 Unspecified optic neuritis: Secondary | ICD-10-CM | POA: Insufficient documentation

## 2013-11-07 ENCOUNTER — Encounter: Payer: Self-pay | Admitting: *Deleted

## 2013-12-06 ENCOUNTER — Encounter: Payer: Self-pay | Admitting: Internal Medicine

## 2013-12-15 ENCOUNTER — Ambulatory Visit: Payer: Self-pay | Admitting: Neurology

## 2013-12-15 LAB — GLUCOSE, CSF: GLUCOSE, CSF: 53 mg/dL (ref 40–75)

## 2013-12-15 LAB — CBC WITH DIFFERENTIAL/PLATELET
BASOS ABS: 0 10*3/uL (ref 0.0–0.1)
Basophil %: 0.5 %
EOS ABS: 0.2 10*3/uL (ref 0.0–0.7)
EOS PCT: 3.2 %
HCT: 44 % (ref 35.0–47.0)
HGB: 14.7 g/dL (ref 12.0–16.0)
Lymphocyte #: 1.2 10*3/uL (ref 1.0–3.6)
Lymphocyte %: 17.8 %
MCH: 29.9 pg (ref 26.0–34.0)
MCHC: 33.3 g/dL (ref 32.0–36.0)
MCV: 90 fL (ref 80–100)
Monocyte #: 0.5 x10 3/mm (ref 0.2–0.9)
Monocyte %: 6.8 %
Neutrophil #: 4.8 10*3/uL (ref 1.4–6.5)
Neutrophil %: 71.7 %
Platelet: 223 10*3/uL (ref 150–440)
RBC: 4.9 10*6/uL (ref 3.80–5.20)
RDW: 15.8 % — ABNORMAL HIGH (ref 11.5–14.5)
WBC: 6.7 10*3/uL (ref 3.6–11.0)

## 2013-12-15 LAB — PROTIME-INR
INR: 1
PROTHROMBIN TIME: 13.2 s (ref 11.5–14.7)

## 2013-12-15 LAB — CSF CELL COUNT WITH DIFFERENTIAL
CSF Tube #: 3
Eosinophil: 0 %
Lymphocytes: 0 %
Monocytes/Macrophages: 0 %
NEUTROS PCT: 0 %
OTHER CELLS: 0 %
RBC (CSF): 25 /mm3
WBC (CSF): 0 /mm3

## 2013-12-15 LAB — PROTEIN, CSF: Protein, CSF: 35 mg/dL (ref 15–45)

## 2013-12-15 LAB — GLUCOSE, RANDOM: Glucose: 105 mg/dL — ABNORMAL HIGH (ref 65–99)

## 2013-12-15 LAB — HCG, QUANTITATIVE, PREGNANCY: Beta Hcg, Quant.: <1 m[IU]/mL — ABNORMAL LOW

## 2013-12-22 ENCOUNTER — Encounter (HOSPITAL_COMMUNITY): Payer: Self-pay | Admitting: Emergency Medicine

## 2013-12-22 ENCOUNTER — Emergency Department (HOSPITAL_COMMUNITY)
Admission: EM | Admit: 2013-12-22 | Discharge: 2013-12-22 | Disposition: A | Discharge status: Home / Self Care | Payer: 59 | Attending: Emergency Medicine | Admitting: Emergency Medicine

## 2013-12-22 ENCOUNTER — Emergency Department: Payer: Self-pay | Admitting: Student

## 2013-12-22 DIAGNOSIS — R51 Headache: Secondary | ICD-10-CM | POA: Insufficient documentation

## 2013-12-22 DIAGNOSIS — R52 Pain, unspecified: Secondary | ICD-10-CM | POA: Diagnosis not present

## 2013-12-22 DIAGNOSIS — Z8669 Personal history of other diseases of the nervous system and sense organs: Secondary | ICD-10-CM | POA: Insufficient documentation

## 2013-12-22 DIAGNOSIS — Z79899 Other long term (current) drug therapy: Secondary | ICD-10-CM | POA: Insufficient documentation

## 2013-12-22 DIAGNOSIS — Z862 Personal history of diseases of the blood and blood-forming organs and certain disorders involving the immune mechanism: Secondary | ICD-10-CM | POA: Insufficient documentation

## 2013-12-22 DIAGNOSIS — Z8639 Personal history of other endocrine, nutritional and metabolic disease: Secondary | ICD-10-CM | POA: Insufficient documentation

## 2013-12-22 DIAGNOSIS — R519 Headache, unspecified: Secondary | ICD-10-CM

## 2013-12-22 DIAGNOSIS — Z8709 Personal history of other diseases of the respiratory system: Secondary | ICD-10-CM | POA: Insufficient documentation

## 2013-12-22 HISTORY — DX: Multiple sclerosis: G35

## 2013-12-22 NOTE — ED Notes (Signed)
Pt reports she had a spinal tap procedure last Thursday morning. Thursday evening began having headache. States headache not relieved except tylenol and caffeine. Patient requesting spinal patch. Pt awake, alert, oriented x4. NAD at present. Denies dizziness, numbness, blurred vision.

## 2013-12-22 NOTE — ED Notes (Signed)
Dr.Lockwood at bedside  

## 2013-12-22 NOTE — ED Notes (Addendum)
Pt reports having a lumbar puncture last Thursday for MS; started c/o headaches relieved with Tylenol and caffeine x 3 days. Headaches worsening with position changes. Pt reports soreness in neck. Denies tingling, numbness, or dizziness. Pt requesting blood patch. Surgery completed by Dr. Brigitte Pulse. Last took Tylenol at 1pm

## 2013-12-22 NOTE — ED Provider Notes (Signed)
CSN: [id number]     Arrival date & time 12/22/13  1229 History   First MD Initiated Contact with Patient 12/22/13 1600     Chief Complaint  Patient presents with  . Headache      HPI  Patient presents with ongoing headache. Patient was referred here by her neurologist. Patient had lumbar puncture one week ago as a part of ongoing evaluation for new multiple sclerosis diagnosed. The patient notes that since the puncture she has had severe headache, worse with upright positioning, minimally changed with caffeine, Tylenol. There is no new weakness in an extremity: No dysesthesia in any extremity, no new confusion, disorientation, fever, chills, back pain, incontinence. Patient's neurologist has been trying to arrange outpatient blood patch procedure, without success.   Past Medical History  Diagnosis Date  . Sinusitis   . Vitamin D deficiency   . Lumbago   . Multiple sclerosis    Past Surgical History  Procedure Laterality Date  . Cesarean section      x 1   Family History  Problem Relation Age of Onset  . Hypertension Mother   . Hyperlipidemia Mother   . Hypertension Father   . Cancer Maternal Grandmother 62    colon  . Cancer Cousin 52    colon   History  Substance Use Topics  . Smoking status: Never Smoker   . Smokeless tobacco: Never Used  . Alcohol Use: 1.8 - 2.4 oz/week    3-4 Glasses of wine per week     Comment: occasional   OB History   Grav Para Term Preterm Abortions TAB SAB Ect Mult Living   2 2 2             Review of Systems  All other systems reviewed and are negative.     Allergies  Azithromycin and Ciprofloxacin hcl  Home Medications   Prior to Admission medications   Medication Sig Start Date End Date Taking? Authorizing Provider  acetaminophen (TYLENOL) 500 MG tablet Take 500 mg by mouth every 6 (six) hours as needed for mild pain.   Yes Historical Provider, MD  Vitamin D, Ergocalciferol, (DRISDOL) 50000 UNITS CAPS capsule Take 50,000  Units by mouth every 7 (seven) days. No specific day 05/16/13  Yes Patricia Rolen-Grubb, FNP   BP 149/90  Pulse 70  Temp(Src) 98.7 F (37.1 C) (Oral)  Resp 16  Ht 5\' 8"  (1.727 m)  Wt 165 lb (74.844 kg)  BMI 25.09 kg/m2  SpO2 100%  LMP 12/13/2013 Physical Exam  Nursing note and vitals reviewed. Constitutional: She is oriented to person, place, and time. She appears well-developed and well-nourished. No distress.  HENT:  Head: Normocephalic and atraumatic.  Eyes: Conjunctivae and EOM are normal.  Cardiovascular: Normal rate and regular rhythm.   Pulmonary/Chest: Effort normal and breath sounds normal. No stridor. No respiratory distress.  Abdominal: She exhibits no distension.  Musculoskeletal: She exhibits no edema.  Neurological: She is alert and oriented to person, place, and time. No cranial nerve deficit. She exhibits normal muscle tone. Coordination normal.  Skin: Skin is warm and dry.  Lumbar spine is clean, dry, intact with no appreciable deformity, erythema, tenderness to  Psychiatric: She has a normal mood and affect.    ED Course  Procedures (including critical care time) After the initial evaluation I discussed the patient's case with her neurologist, our interventional radiologists, however neurologist, and eventually with palpation radiology. Patient will have blood patch procedure performed tomorrow.  MDM   Patient  was informed after lumbar puncture, conducted as a part of multiple sclerosis evaluation, now with ongoing headache, likely post lumbar puncture associated. Patient is neurologically intact and hemodynamically stable, awake, alert, in no distress.  Given the patient still has headache, I spent significant time arrange for next-day blood patch procedure.  Patient was understanding of home care instructions, return precautions, was discharged in stable condition.  Carmin Muskrat, MD 12/22/13 [phone]

## 2013-12-22 NOTE — Discharge Instructions (Signed)
As discussed you should receive a call from our radiologist in the morning. If you do not hear from the office, please contact them directly. If you develop new, or concerning changes in the interim, please be sure to return here.

## 2013-12-23 ENCOUNTER — Ambulatory Visit
Admission: RE | Admit: 2013-12-23 | Discharge: 2013-12-23 | Disposition: A | Discharge status: Home / Self Care | Payer: 59 | Source: Ambulatory Visit | Attending: Emergency Medicine | Admitting: Emergency Medicine

## 2013-12-23 ENCOUNTER — Other Ambulatory Visit: Payer: Self-pay | Admitting: Emergency Medicine

## 2013-12-23 VITALS — BP 118/76 | HR 75

## 2013-12-23 DIAGNOSIS — G971 Other reaction to spinal and lumbar puncture: Secondary | ICD-10-CM

## 2013-12-23 MED ORDER — IOHEXOL 180 MG/ML  SOLN
1.0000 mL | Freq: Once | INTRAMUSCULAR | Status: AC | PRN
  Administered 2013-12-23: 1 mL via EPIDURAL

## 2013-12-23 NOTE — Discharge Instructions (Signed)
Epidural Blood Patch Discharge Instructions  1. Go home and rest quietly for the next 24 hours.  It is important to lie flat for the next 24 hours.  Get up only to go to the restroom.  You may lie in the bed or on a couch on your back, your stomach, your left side or your right side.  You may have one pillow under your head.  You may have pillows between your knees while you are on your side or under your knees while you are on your back.  2. DO NOT drive today.  Recline the seat as far back as it will go, while still wearing your seat belt, on the way home.  3. You may get up to go to the bathroom as needed.  You may sit up for 10 minutes to eat.  You may resume your normal diet and medications unless otherwise indicated.  Drink plenty of extra fluids today and tomorrow.  4. You may resume normal activities after your 24 hours of bed rest is over; however, do not exert yourself strongly or do any heavy lifting.  Please call us at [phone] with any questions.

## 2013-12-23 NOTE — Progress Notes (Signed)
Dr. Maree Erie in to assess patient after Blood Patch.  jkl

## 2014-02-13 ENCOUNTER — Encounter (HOSPITAL_COMMUNITY): Payer: Self-pay | Admitting: Emergency Medicine

## 2014-03-02 ENCOUNTER — Ambulatory Visit: Payer: Self-pay | Admitting: Neurology

## 2014-05-22 ENCOUNTER — Encounter: Payer: Self-pay | Admitting: Nurse Practitioner

## 2014-05-22 ENCOUNTER — Ambulatory Visit (INDEPENDENT_AMBULATORY_CARE_PROVIDER_SITE_OTHER): Payer: 59 | Admitting: Nurse Practitioner

## 2014-05-22 VITALS — BP 130/84 | HR 72 | Ht 68.0 in | Wt 170.0 lb

## 2014-05-22 DIAGNOSIS — Z01419 Encounter for gynecological examination (general) (routine) without abnormal findings: Secondary | ICD-10-CM

## 2014-05-22 NOTE — Progress Notes (Signed)
Patient ID: Maria Jennings, female   DOB: 1970/10/08, 44 y.o.   MRN: [id number] 44 y.o. [id number] Married  Caucasian Fe here for annual exam.  Saw PA at [facility] for sinusitis last June.  Then had eye blurriness with exercise R> L.  Then went back with that complaint and had brain MRI.  Was seen at [facility] and subsequent diagnosis of MS was confirmed.   Now on Tecfidera.  Doing well.  Menses are regular lasting 3 days.  No cramps. Withdrawal for birth control. She remains very active and still does running and hiking.  She still work as PT at [employer].  Patient's last menstrual period was 05/01/2014.          Sexually active: Yes.    The current method of family planning is rhythm method.    Exercising: Yes.    biking and running.  1/2 marathons and triathalons Smoker:  no  Health Maintenance: Pap:  05/10/12, negative with neg HR HPV MMG:  10/19/13 with additional views on Left; Bi-Rads 1:  Negative  TDaP:  08/16/08 per EPIC history Labs:  Dr. Gilford Rile in [facility], also taking care of Vit D   reports that she has never smoked. She has never used smokeless tobacco. She reports that she drinks about 1.8 - 2.4 oz of alcohol per week. She reports that she does not use illicit drugs.  Past Medical History  Diagnosis Date  . Sinusitis   . Vitamin D deficiency   . Lumbago   . Multiple sclerosis     Past Surgical History  Procedure Laterality Date  . Cesarean section      x 1  . Knee arthroscopy Right 07/2013    Current Outpatient Prescriptions  Medication Sig Dispense Refill  . Dimethyl Fumarate 240 MG CPDR Take 1 capsule by mouth 2 (two) times daily.    . Vitamin D, Ergocalciferol, (DRISDOL) 50000 UNITS CAPS capsule Take 50,000 Units by mouth every 7 (seven) days. No specific day     No current facility-administered medications for this visit.    Family History  Problem Relation Age of Onset  . Hypertension Mother   . Hyperlipidemia Mother   . Hypertension Father   . Cancer Maternal Grandmother 41     colon  . Cancer Cousin 52    colon    ROS:  Pertinent items are noted in HPI.  Otherwise, a comprehensive ROS was negative.  Exam:   BP 130/84 mmHg  Pulse 72  Ht 5\' 8"  (1.727 m)  Wt 170 lb (77.111 kg)  BMI 25.85 kg/m2  LMP 05/01/2014 Height: 5\' 8"  (172.7 cm) Ht Readings from Last 3 Encounters:  05/22/14 5\' 8"  (1.727 m)  12/22/13 5\' 8"  (1.727 m)  10/17/13 5\' 9"  (1.753 m)    General appearance: alert, cooperative and appears stated age Head: Normocephalic, without obvious abnormality, atraumatic Neck: no adenopathy, supple, symmetrical, trachea midline and thyroid normal to inspection and palpation Lungs: clear to auscultation bilaterally Breasts: normal appearance, no masses or tenderness Heart: regular rate and rhythm Abdomen: soft, non-tender; no masses,  no organomegaly Extremities: extremities normal, atraumatic, no cyanosis or edema Skin: Skin color, texture, turgor normal. No rashes or lesions Lymph nodes: Cervical, supraclavicular, and axillary nodes normal. No abnormal inguinal nodes palpated Neurologic: Grossly normal   Pelvic: External genitalia:  no lesions              Urethra:  normal appearing urethra with no masses, tenderness or lesions  Bartholin's and Skene's: normal                 Vagina: normal appearing vagina with normal color and discharge, no lesions              Cervix: anteverted              Pap taken: No. Bimanual Exam:  Uterus:  normal size, contour, position, consistency, mobility, non-tender              Adnexa: no mass, fullness, tenderness               Rectovaginal: Confirms               Anus:  normal sphincter tone, no lesions  Chaperone present:  no  A:  Well Woman with normal exam  Normal menses Withdrawal for birth control History of Vit D deficiency  New Diagnosis of MS 10/2013  P:   Reviewed health and wellness pertinent to exam  Pap smear not taken today  Mammogram is due  7/16  Counseled on breast self exam, mammography screening, adequate intake of calcium and vitamin D, diet and exercise return annually or prn  An After Visit Summary was printed and given to the patient.

## 2014-05-22 NOTE — Patient Instructions (Signed)

## 2014-05-23 NOTE — Progress Notes (Signed)
Encounter reviewed by Dr. Tatem Holsonback Silva.  

## 2014-06-02 ENCOUNTER — Encounter: Payer: Self-pay | Admitting: Internal Medicine

## 2014-06-02 ENCOUNTER — Ambulatory Visit (INDEPENDENT_AMBULATORY_CARE_PROVIDER_SITE_OTHER): Payer: 59 | Admitting: Internal Medicine

## 2014-06-02 VITALS — BP 135/87 | HR 76 | Temp 98.4°F | Ht 68.5 in | Wt 170.0 lb

## 2014-06-02 DIAGNOSIS — G35 Multiple sclerosis: Secondary | ICD-10-CM

## 2014-06-02 DIAGNOSIS — Z0000 Encounter for general adult medical examination without abnormal findings: Secondary | ICD-10-CM

## 2014-06-02 LAB — CBC WITH DIFFERENTIAL/PLATELET
BASOS PCT: 0.6 % (ref 0.0–3.0)
Basophils Absolute: 0 10*3/uL (ref 0.0–0.1)
Eosinophils Absolute: 0.4 10*3/uL (ref 0.0–0.7)
Eosinophils Relative: 5 % (ref 0.0–5.0)
HCT: 40.4 % (ref 36.0–46.0)
HEMOGLOBIN: 13.8 g/dL (ref 12.0–15.0)
LYMPHS PCT: 19.5 % (ref 12.0–46.0)
Lymphs Abs: 1.6 10*3/uL (ref 0.7–4.0)
MCHC: 34.1 g/dL (ref 30.0–36.0)
MCV: 89.6 fl (ref 78.0–100.0)
MONOS PCT: 7.9 % (ref 3.0–12.0)
Monocytes Absolute: 0.6 10*3/uL (ref 0.1–1.0)
NEUTROS PCT: 67 % (ref 43.0–77.0)
Neutro Abs: 5.4 10*3/uL (ref 1.4–7.7)
Platelets: 230 10*3/uL (ref 150.0–400.0)
RBC: 4.51 Mil/uL (ref 3.87–5.11)
RDW: 13.2 % (ref 11.5–15.5)
WBC: 8.1 10*3/uL (ref 4.0–10.5)

## 2014-06-02 LAB — LIPID PANEL
CHOLESTEROL: 176 mg/dL (ref 0–200)
HDL: 57.3 mg/dL (ref >39.00)
LDL Cholesterol: 105 mg/dL — ABNORMAL HIGH (ref 0–99)
NONHDL: 118.7
Total CHOL/HDL Ratio: 3
Triglycerides: 67 mg/dL (ref 0.0–149.0)
VLDL: 13.4 mg/dL (ref 0.0–40.0)

## 2014-06-02 LAB — COMPREHENSIVE METABOLIC PANEL
ALT: 10 U/L (ref 0–35)
AST: 13 U/L (ref 0–37)
Albumin: 4.4 g/dL (ref 3.5–5.2)
Alkaline Phosphatase: 69 U/L (ref 39–117)
BILIRUBIN TOTAL: 0.6 mg/dL (ref 0.2–1.2)
BUN: 12 mg/dL (ref 6–23)
CALCIUM: 9.5 mg/dL (ref 8.4–10.5)
CO2: 27 meq/L (ref 19–32)
CREATININE: 0.58 mg/dL (ref 0.40–1.20)
Chloride: 103 mEq/L (ref 96–112)
GFR: 118.93 mL/min (ref >60.00)
Glucose, Bld: 78 mg/dL (ref 70–99)
POTASSIUM: 4.3 meq/L (ref 3.5–5.1)
SODIUM: 137 meq/L (ref 135–145)
Total Protein: 6.9 g/dL (ref 6.0–8.3)

## 2014-06-02 LAB — TSH: TSH: 3.15 u[IU]/mL (ref 0.35–4.50)

## 2014-06-02 LAB — VITAMIN D 25 HYDROXY (VIT D DEFICIENCY, FRACTURES): VITD: 21.49 ng/mL — ABNORMAL LOW (ref 30.00–100.00)

## 2014-06-02 NOTE — Patient Instructions (Signed)

## 2014-06-02 NOTE — Assessment & Plan Note (Signed)
General exam normal today. Breast exam and pelvic exam deferred as recently completed by GYN. Will check basic labs including CBC, CMP, lipids, Vit D. Will schedule mammogram in 10/2014.  Encouraged continued healthy lifestyle, with healthy diet and regular physical activity such as running. Follow up 1 year and prn.

## 2014-06-02 NOTE — Progress Notes (Signed)
Subjective:    Patient ID: Rosalyn Gess, female    DOB: Nov 22, 1970, 44 y.o.   MRN: [id number]  HPI  44YO female presents for physical exam.  Feeling well. Following a healthy diet.  Continues to run. Has blurred vision in right eye with running, secondary to MS. Running 3-4 times per week, about 4 miles. Also working out core.  Wt Readings from Last 3 Encounters:  06/02/14 170 lb (77.111 kg)  05/22/14 170 lb (77.111 kg)  12/22/13 165 lb (74.844 kg)   BP Readings from Last 3 Encounters:  06/02/14 135/87  05/22/14 130/84  12/23/13 118/76    Past medical, surgical, family and social history per today's encounter.  Review of Systems  Constitutional: Negative for fever, chills, appetite change, fatigue and unexpected weight change.  Eyes: Positive for visual disturbance.  Respiratory: Negative for shortness of breath.   Cardiovascular: Negative for chest pain and leg swelling.  Gastrointestinal: Negative for abdominal pain.  Skin: Negative for color change and rash.  Neurological: Negative for dizziness, weakness and numbness.  Hematological: Negative for adenopathy. Does not bruise/bleed easily.  Psychiatric/Behavioral: Negative for dysphoric mood. The patient is not nervous/anxious.        Objective:    BP 135/87 mmHg  Pulse 76  Temp(Src) 98.4 F (36.9 C) (Oral)  Ht 5' 8.5" (1.74 m)  Wt 170 lb (77.111 kg)  BMI 25.47 kg/m2  SpO2 100%  LMP 06/02/2014 Physical Exam  Constitutional: She is oriented to person, place, and time. She appears well-developed and well-nourished. No distress.  HENT:  Head: Normocephalic and atraumatic.  Right Ear: External ear normal.  Left Ear: External ear normal.  Nose: Nose normal.  Mouth/Throat: Oropharynx is clear and moist. No oropharyngeal exudate.  Eyes: Conjunctivae and EOM are normal. Pupils are equal, round, and reactive to light. Right eye exhibits no discharge.  Neck: Normal range of motion. Neck supple. No thyromegaly  present.  Cardiovascular: Normal rate, regular rhythm, normal heart sounds and intact distal pulses.  Exam reveals no gallop and no friction rub.   No murmur heard. Pulmonary/Chest: Effort normal. No respiratory distress. She has no wheezes. She has no rales.  Abdominal: Soft. Bowel sounds are normal. She exhibits no distension and no mass. There is no tenderness. There is no rebound and no guarding.  Musculoskeletal: Normal range of motion. She exhibits no edema or tenderness.  Lymphadenopathy:    She has no cervical adenopathy.  Neurological: She is alert and oriented to person, place, and time. No cranial nerve deficit. Coordination normal.  Skin: Skin is warm and dry. No rash noted. She is not diaphoretic. No erythema. No pallor.  Psychiatric: She has a normal mood and affect. Her behavior is normal. Judgment and thought content normal.          Assessment & Plan:   Problem List Items Addressed This Visit      Unprioritized   DS (disseminated sclerosis)    Doing well on Tecfidara. Reviewed notes from Neurology today including MRI Cervical Spine and Lumbar Spine.      Routine general medical examination at a health care facility - Primary    General exam normal today. Breast exam and pelvic exam deferred as recently completed by GYN. Will check basic labs including CBC, CMP, lipids, Vit D. Will schedule mammogram in 10/2014.  Encouraged continued healthy lifestyle, with healthy diet and regular physical activity such as running. Follow up 1 year and prn.  Relevant Orders   CBC with Differential/Platelet   Comprehensive metabolic panel   Lipid panel   Vit D  25 hydroxy (rtn osteoporosis monitoring)   TSH       Return in about 1 year (around 06/03/2015) for Physical.

## 2014-06-02 NOTE — Assessment & Plan Note (Signed)
Doing well on Tecfidara. Reviewed notes from Neurology today including MRI Cervical Spine and Lumbar Spine.

## 2014-06-02 NOTE — Progress Notes (Signed)
Pre visit review using our clinic review tool, if applicable. No additional management support is needed unless otherwise documented below in the visit note. 

## 2014-06-05 DIAGNOSIS — Z79899 Other long term (current) drug therapy: Secondary | ICD-10-CM | POA: Insufficient documentation

## 2014-07-26 ENCOUNTER — Other Ambulatory Visit: Payer: Self-pay | Admitting: *Deleted

## 2014-07-26 MED ORDER — VITAMIN D (ERGOCALCIFEROL) 1.25 MG (50000 UNIT) PO CAPS: 50000.0000 [IU] | ORAL_CAPSULE | ORAL | Status: DC

## 2014-08-05 NOTE — Op Note (Signed)
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   POSTOPERATIVE DIAGNOSIS:  Right knee medial meniscus tear, sprain of the anterior cruciate ligament, tricompartmental chondromalacia and synovitis.   PROCEDURE:  Right knee arthroscopic partial medial meniscectomy, partial synovectomy and chondroplasty of the undersurface of the patella.   ANESTHESIA:  General.   SPECIMENS:  None.   ESTIMATED BLOOD LOSS:  Minimal.   COMPLICATIONS:  None.   SURGEON:  Timoteo Gaul, MD   INDICATIONS FOR PROCEDURE:  The patient is a 44 year old female, who has had over 9 months of medial-sided right knee pain with mechanical symptoms, which affects her ability to participate in athletic activity. An MRI had suggested a complex tear of the medial meniscus with degenerative signal of the ACL and tricompartmental chondromalacia. Given the persistence of symptoms and failure to respond to nonoperative management, the patient decided to proceed with an arthroscopic partial medial meniscectomy. I reviewed the risks and benefits of surgery with the patient prior to the date of surgery in my office. She understands the risks include infection, bleeding, nerve or blood vessel injury, knee stiffness, persistent pain or mechanical symptoms, knee instability, sprain or injury to knee ligaments, osteoarthritis, retear of the meniscus and the need for further surgery. Medical risks include, but are not limited to, DVT and pulmonary embolism, myocardial infarction, stroke, pneumonia, respiratory failure and death. The patient understood these risks and wished to proceed.   PROCEDURE NOTE:  The patient was met in the preoperative area. I marked the right knee with the word "yes" within the operative field according to the hospital's right site protocol. I updated the  patient's history and physical. Her husband was at the bedside, and I answered all questions regarding surgery and the postoperative course. The patient was then brought to the operating room where she underwent general anesthesia. She was then prepped and draped in a sterile fashion. A timeout was performed to verify the patient's name, date of birth, medical record number, correct site of surgery and correct procedure to be performed. It was also used to verify that the patient had received antibiotics and that appropriate instruments, implants, and radiographic studies were available in the room. Once all in attendance were in agreement, the case began.   An examination under anesthesia revealed the patient had approximately 3 to 5 mm of anterior translation on Lachman's test. She had a slight pivot shift also on exam. There was no laxity with varus or valgus stressing at 0 and 30 degrees of flexion. She had full knee range of motion from 0 to 130 degrees.   The proposed arthroscopy incisions were drawn out with a surgical marker, based upon bony landmarks, and pre-injected with 1% lidocaine plain. An 11 blade was then used to create the inferolateral portal. An arthroscope was placed through this portal into the suprapatellar pouch. A full diagnostic examination of the right knee was performed including the suprapatellar pouch, the patellofemoral joint, the medial and lateral gutters, the medial and lateral compartments, the intercondylar notch and the posterior knee.   Findings on arthroscopy included synovitis in the suprapatellar pouch, the medial and lateral gutters, and the anterior knee. The patient had fissuring of the central ridge of the patella and chondromalacia of the lateral facet. The patient had softening of the cartilage overlying the lateral tibial plateau. The ACL had slight laxity to  it, but there was no overt tear of the origin or insertion. I suspect a partial tear based on the laxity  seen during probing of the ACL. The medial compartment also had mild chondromalacia of the medial femoral condyle and medial tibial plateau. There was a complex tear of the posterior horn of the medial meniscus. The patient also had reflection superiorly of the anterior horn of the medial meniscus with scar tissue surrounding it. There were no loose bodies seen.   The initial attention was turned to addressing the complex tear of the posterior horn of the medial meniscus. A 4.0 resector shaver blade, a 3.5 resector shaver blade, and straight and up-biting duckbill baskets were used to perform a partial medial meniscectomy. Care was taken to leave a stable rim of meniscus. The anterior horn was released from the scar tissue using a 90-degree ArthroCare wand. A portion of the anterior meniscus was removed as well due to a tear and fraying in this location. A Hook probe was used to confirm a stable rim. Final images of the medial compartment following partial medial meniscectomy were then taken. A 90-degree ArthroCare wand and 4.0 resector shaver blade were used to perform a limited synovectomy of the anterior knee. This was actually performed prior to the medial meniscectomy to assist with visualization of the anterior knee. Finally, the 4.0 resector shaver blade was used to perform a chondroplasty of the undersurface of the patella to remove loose cartilage. This was a limited chondroplasty.   The knee joint was then copiously irrigated to remove all meniscus and chondral debris. All arthroscopic instruments were then removed. The 2 arthroscopic portals were closed with a 4-0 nylon, and 0.25% Marcaine was then injected for a total of 20 mL intra-articularly to help with postoperative pain. A dry, sterile and compressive dressing was then applied to the right knee. The patient was then awakened and transferred to a hospital bed. She was transferred to the PACU in stable condition. I was scrubbed and present for  the entire case, and all sharp and instrument counts were correct at the conclusion of the case.   I spoke with the patient's husband in postoperative consultation room to let him know the case had gone without complication and his wife was stable in the recovery room. I also spoke with the patient in the recovery room. She was neurovascularly intact, and her pain was well controlled. I answered all her questions. The patient will follow up with me in a week in my office. She is being sent home with Norco for pain control and Phenergan for nausea. Written instructions were also sent home with her with my postoperative directions.      ____________________________ Timoteo Gaul, MD klk:ms D: 07/15/2013 23:18:49 ET T: 07/15/2013 23:43:14 ET JOB#: [id number]  cc: Timoteo Gaul, MD, <Dictator> Timoteo Gaul MD ELECTRONICALLY SIGNED 08/02/2013 15:29

## 2014-08-15 ENCOUNTER — Other Ambulatory Visit: Payer: Self-pay | Admitting: *Deleted

## 2014-08-15 DIAGNOSIS — J320 Chronic maxillary sinusitis: Secondary | ICD-10-CM

## 2014-08-15 MED ORDER — LORATADINE-PSEUDOEPHEDRINE ER 5-120 MG PO TB12: 1.0000 | ORAL_TABLET | Freq: Two times a day (BID) | ORAL | Status: DC

## 2014-08-15 NOTE — Telephone Encounter (Signed)
Fax from pharmacy, requesting refill, not currently on med list..ok to send?

## 2014-09-13 ENCOUNTER — Ambulatory Visit (INDEPENDENT_AMBULATORY_CARE_PROVIDER_SITE_OTHER): Payer: 59

## 2014-09-13 DIAGNOSIS — E538 Deficiency of other specified B group vitamins: Secondary | ICD-10-CM

## 2014-09-13 MED ORDER — CYANOCOBALAMIN 1000 MCG/ML IJ SOLN
1000.0000 ug | Freq: Once | INTRAMUSCULAR | Status: AC
  Administered 2014-09-13: 1000 ug via INTRAMUSCULAR

## 2014-09-13 NOTE — Progress Notes (Signed)
1 out of 3 injections given. Pt tolerated well. Pt given instructions to schedule the next 2 and 1 per month after that.

## 2014-09-21 ENCOUNTER — Ambulatory Visit (INDEPENDENT_AMBULATORY_CARE_PROVIDER_SITE_OTHER): Payer: 59

## 2014-09-21 DIAGNOSIS — E538 Deficiency of other specified B group vitamins: Secondary | ICD-10-CM

## 2014-09-21 MED ORDER — CYANOCOBALAMIN 1000 MCG/ML IJ SOLN
1000.0000 ug | Freq: Once | INTRAMUSCULAR | Status: AC
  Administered 2014-09-21: 1000 ug via INTRAMUSCULAR

## 2014-09-21 NOTE — Progress Notes (Signed)
Patient came for 2nd b12 injection.  Patient preferred in Left Deltoid.  Tolerated well.

## 2014-09-28 ENCOUNTER — Ambulatory Visit: Payer: 59

## 2014-10-03 ENCOUNTER — Other Ambulatory Visit: Payer: Self-pay | Admitting: Nurse Practitioner

## 2014-10-03 ENCOUNTER — Telehealth: Payer: Self-pay | Admitting: Nurse Practitioner

## 2014-10-03 MED ORDER — ORPHENADRINE CITRATE ER 100 MG PO TB12: 100.0000 mg | ORAL_TABLET | Freq: Two times a day (BID) | ORAL | Status: DC

## 2014-10-03 NOTE — Telephone Encounter (Signed)
In her paper chart there was an entry from 03/12/10 for Norflex given for back pain.  Ok to refill Norflex 100 mg # 30 . Order is placed.

## 2014-10-03 NOTE — Telephone Encounter (Signed)
Medication refill request: Norflex Last AEX:  06/02/14 with PG Next AEX: 05/28/15 with PG Last MMG (if hormonal medication request): n/a Refill authorized: Please advise   Please advise I don't see where this has been prescribed from you?

## 2014-10-03 NOTE — Telephone Encounter (Signed)
Patient would like a prescription for Norflex sent to [facility] in [place] [address] at [phone].

## 2014-10-04 ENCOUNTER — Ambulatory Visit (INDEPENDENT_AMBULATORY_CARE_PROVIDER_SITE_OTHER): Payer: 59

## 2014-10-04 DIAGNOSIS — E538 Deficiency of other specified B group vitamins: Secondary | ICD-10-CM | POA: Diagnosis not present

## 2014-10-04 MED ORDER — ORPHENADRINE CITRATE ER 100 MG PO TB12: 100.0000 mg | ORAL_TABLET | Freq: Two times a day (BID) | ORAL | Status: DC

## 2014-10-04 MED ORDER — CYANOCOBALAMIN 1000 MCG/ML IJ SOLN
1000.0000 ug | Freq: Once | INTRAMUSCULAR | Status: AC
  Administered 2014-10-04: 1000 ug via INTRAMUSCULAR

## 2014-10-04 NOTE — Telephone Encounter (Signed)
LM on patient's vm that rx has been sent to pharmacy.

## 2014-10-04 NOTE — Progress Notes (Signed)
Pt came in for B12 injection, given in Left deltoid, pt tolerated well.

## 2014-10-12 ENCOUNTER — Ambulatory Visit (INDEPENDENT_AMBULATORY_CARE_PROVIDER_SITE_OTHER): Payer: 59 | Admitting: *Deleted

## 2014-10-12 DIAGNOSIS — E538 Deficiency of other specified B group vitamins: Secondary | ICD-10-CM | POA: Diagnosis not present

## 2014-10-12 MED ORDER — CYANOCOBALAMIN 1000 MCG/ML IJ SOLN
1000.0000 ug | Freq: Once | INTRAMUSCULAR | Status: AC
  Administered 2014-10-12: 1000 ug via INTRAMUSCULAR

## 2014-10-24 ENCOUNTER — Telehealth: Payer: Self-pay | Admitting: Internal Medicine

## 2014-10-24 NOTE — Telephone Encounter (Signed)
Pt advise that she received an email to come to get lab work. No orders in the system.msn

## 2014-10-25 ENCOUNTER — Other Ambulatory Visit (INDEPENDENT_AMBULATORY_CARE_PROVIDER_SITE_OTHER): Payer: 59

## 2014-10-25 DIAGNOSIS — Z139 Encounter for screening, unspecified: Secondary | ICD-10-CM

## 2014-10-26 LAB — VITAMIN B12: Vitamin B-12: 341 pg/mL (ref 211–911)

## 2014-10-26 LAB — VITAMIN D 25 HYDROXY (VIT D DEFICIENCY, FRACTURES): VITD: 46.57 ng/mL (ref 30.00–100.00)

## 2014-11-20 ENCOUNTER — Encounter: Payer: Self-pay | Admitting: Internal Medicine

## 2014-11-21 ENCOUNTER — Ambulatory Visit: Payer: 59 | Admitting: Internal Medicine

## 2014-11-23 ENCOUNTER — Ambulatory Visit: Payer: 59 | Admitting: Internal Medicine

## 2014-11-23 ENCOUNTER — Encounter: Payer: Self-pay | Admitting: Internal Medicine

## 2014-11-23 ENCOUNTER — Ambulatory Visit (INDEPENDENT_AMBULATORY_CARE_PROVIDER_SITE_OTHER): Payer: 59 | Admitting: Internal Medicine

## 2014-11-23 VITALS — BP 124/68 | HR 78 | Temp 97.6°F | Wt 159.6 lb

## 2014-11-23 DIAGNOSIS — K219 Gastro-esophageal reflux disease without esophagitis: Secondary | ICD-10-CM | POA: Diagnosis not present

## 2014-11-23 MED ORDER — PANTOPRAZOLE SODIUM 40 MG PO TBEC: 40.0000 mg | DELAYED_RELEASE_TABLET | Freq: Every day | ORAL | Status: DC

## 2014-11-23 NOTE — Progress Notes (Signed)
Pre visit review using our clinic review tool, if applicable. No additional management support is needed unless otherwise documented below in the visit note. 

## 2014-11-23 NOTE — Progress Notes (Signed)
Subjective:    Patient ID: Maria Jennings, female    DOB: 1971-03-15, 44 y.o.   MRN: [id number]  HPI  44YO female presents for acute visit.  Pain in mid stomach described as pressure with belching over last few months. Comes and goes. Worse at night. Worsened with some foods, such as meat and dairy. No diarrhea. No change in BMs. No NV. Notes more intake of carbs and meat and dairy. Also took Ibuprofen for 3-4 days several weeks ago for back pain. Not taking anything for stomach pain.   Past Medical History  Diagnosis Date  . Sinusitis   . Vitamin D deficiency   . Lumbago   . Multiple sclerosis    Family History  Problem Relation Age of Onset  . Hypertension Mother   . Hyperlipidemia Mother   . Hypertension Father   . Cancer Maternal Grandmother 84    colon  . Cancer Cousin 71    colon   Past Surgical History  Procedure Laterality Date  . Cesarean section      x 1  . Knee arthroscopy Right 07/2013   Social History   Social History  . Marital Status: Married    Spouse Name: N/A  . Number of Children: 2  . Years of Education: N/A   Occupational History  .     Social History Main Topics  . Smoking status: Never Smoker   . Smokeless tobacco: Never Used  . Alcohol Use: 1.8 - 2.4 oz/week    3-4 Glasses of wine per week     Comment: occasional  . Drug Use: No  . Sexual Activity:    Partners: Male    Birth Control/ Protection: None   Other Topics Concern  . Not on file   Social History Narrative   Lives in [place].      Work - Psychologist, forensic    Review of Systems  Constitutional: Negative for fever, chills, appetite change, fatigue and unexpected weight change.  Eyes: Negative for visual disturbance.  Respiratory: Negative for shortness of breath.   Cardiovascular: Negative for chest pain and leg swelling.  Gastrointestinal: Positive for abdominal pain. Negative for nausea, vomiting, diarrhea, constipation and blood in stool.  Skin: Negative for color  change and rash.  Hematological: Negative for adenopathy. Does not bruise/bleed easily.  Psychiatric/Behavioral: Negative for sleep disturbance and dysphoric mood. The patient is not nervous/anxious.        Objective:    BP 124/68 mmHg  Pulse 78  Temp(Src) 97.6 F (36.4 C) (Oral)  Wt 159 lb 9.6 oz (72.394 kg)  SpO2 99%  LMP 11/15/2014 Physical Exam  Constitutional: She is oriented to person, place, and time. She appears well-developed and well-nourished. No distress.  HENT:  Head: Normocephalic and atraumatic.  Right Ear: External ear normal.  Left Ear: External ear normal.  Nose: Nose normal.  Mouth/Throat: Oropharynx is clear and moist. No oropharyngeal exudate.  Eyes: Conjunctivae and EOM are normal. Pupils are equal, round, and reactive to light. Right eye exhibits no discharge.  Neck: Normal range of motion. Neck supple. No thyromegaly present.  Cardiovascular: Normal rate, regular rhythm, normal heart sounds and intact distal pulses.  Exam reveals no gallop and no friction rub.   No murmur heard. Pulmonary/Chest: Effort normal. No respiratory distress. She has no wheezes. She has no rales.  Abdominal: Soft. Bowel sounds are normal. She exhibits no distension and no mass. There is no tenderness. There is no rebound and no guarding.  Musculoskeletal: Normal range of motion. She exhibits no edema or tenderness.  Lymphadenopathy:    She has no cervical adenopathy.  Neurological: She is alert and oriented to person, place, and time. No cranial nerve deficit. Coordination normal.  Skin: Skin is warm and dry. No rash noted. She is not diaphoretic. No erythema. No pallor.  Psychiatric: She has a normal mood and affect. Her behavior is normal. Judgment and thought content normal.          Assessment & Plan:   Problem List Items Addressed This Visit      Unprioritized   GERD (gastroesophageal reflux disease) - Primary    Symptoms most consistent with GERD, likely  exacerbated by MS and use of Dimethyl Fumarate, recent use of Ibuprofen, and change in diet. Encouraged her to avoid eating late dinner, limit intake of high fat and spicy foods at night. Will add Pantoprazole daily x2 weeks, then reassess. If persistent symptoms, will screen for H. Pylori and set up GI evaluation.       Relevant Medications   pantoprazole (PROTONIX) 40 MG tablet       Return in about 4 weeks (around 12/21/2014) for Recheck.

## 2014-11-23 NOTE — Assessment & Plan Note (Signed)
Symptoms most consistent with GERD, likely exacerbated by MS and use of Dimethyl Fumarate, recent use of Ibuprofen, and change in diet. Encouraged her to avoid eating late dinner, limit intake of high fat and spicy foods at night. Will add Pantoprazole daily x2 weeks, then reassess. If persistent symptoms, will screen for H. Pylori and set up GI evaluation.

## 2014-11-23 NOTE — Patient Instructions (Addendum)
Look for Nature's Made B12 5058mcg sublingual tablet and and Vit D3 2000units daily.  Start Pantoprazole 40mg  daily for two weeks.  Email with update.

## 2015-03-13 ENCOUNTER — Other Ambulatory Visit: Payer: Self-pay | Admitting: Internal Medicine

## 2015-03-13 DIAGNOSIS — Z0000 Encounter for general adult medical examination without abnormal findings: Secondary | ICD-10-CM

## 2015-05-04 ENCOUNTER — Telehealth: Payer: Self-pay | Admitting: Internal Medicine

## 2015-05-04 DIAGNOSIS — G35 Multiple sclerosis: Secondary | ICD-10-CM

## 2015-05-04 NOTE — Telephone Encounter (Signed)
Orders placed. Please schedule

## 2015-05-04 NOTE — Telephone Encounter (Signed)
Pt called about needing lab work CBC diff, CMP, Vitamin B12 and Vitamin D. Need orders please and thank you! Call pt @ [phone].

## 2015-05-04 NOTE — Telephone Encounter (Signed)
Please advise 

## 2015-05-04 NOTE — Telephone Encounter (Signed)
Called and scheduled pt for labwork and for annual exam with Dr Gilford Rile

## 2015-05-09 ENCOUNTER — Other Ambulatory Visit (INDEPENDENT_AMBULATORY_CARE_PROVIDER_SITE_OTHER): Payer: 59

## 2015-05-09 DIAGNOSIS — Z0000 Encounter for general adult medical examination without abnormal findings: Secondary | ICD-10-CM | POA: Diagnosis not present

## 2015-05-10 LAB — CBC WITH DIFFERENTIAL/PLATELET
Basophils Absolute: 0 10*3/uL (ref 0.0–0.1)
Basophils Relative: 1.1 % (ref 0.0–3.0)
EOS ABS: 0.3 10*3/uL (ref 0.0–0.7)
EOS PCT: 6.9 % — AB (ref 0.0–5.0)
HCT: 43.5 % (ref 36.0–46.0)
Hemoglobin: 14.5 g/dL (ref 12.0–15.0)
LYMPHS ABS: 1.1 10*3/uL (ref 0.7–4.0)
Lymphocytes Relative: 26 % (ref 12.0–46.0)
MCHC: 33.3 g/dL (ref 30.0–36.0)
MCV: 93.2 fl (ref 78.0–100.0)
MONO ABS: 0.3 10*3/uL (ref 0.1–1.0)
Monocytes Relative: 6.6 % (ref 3.0–12.0)
NEUTROS PCT: 59.4 % (ref 43.0–77.0)
Neutro Abs: 2.6 10*3/uL (ref 1.4–7.7)
Platelets: 229 10*3/uL (ref 150.0–400.0)
RBC: 4.66 Mil/uL (ref 3.87–5.11)
RDW: 14 % (ref 11.5–15.5)
WBC: 4.4 10*3/uL (ref 4.0–10.5)

## 2015-05-10 LAB — COMPREHENSIVE METABOLIC PANEL
ALK PHOS: 67 U/L (ref 39–117)
ALT: 13 U/L (ref 0–35)
AST: 15 U/L (ref 0–37)
Albumin: 4.8 g/dL (ref 3.5–5.2)
BUN: 12 mg/dL (ref 6–23)
CHLORIDE: 100 meq/L (ref 96–112)
CO2: 28 mEq/L (ref 19–32)
Calcium: 9.6 mg/dL (ref 8.4–10.5)
Creatinine, Ser: 0.55 mg/dL (ref 0.40–1.20)
GFR: 125.93 mL/min (ref >60.00)
GLUCOSE: 81 mg/dL (ref 70–99)
POTASSIUM: 4.2 meq/L (ref 3.5–5.1)
SODIUM: 136 meq/L (ref 135–145)
TOTAL PROTEIN: 7.3 g/dL (ref 6.0–8.3)
Total Bilirubin: 0.5 mg/dL (ref 0.2–1.2)

## 2015-05-10 LAB — LIPID PANEL
Cholesterol: 173 mg/dL (ref 0–200)
HDL: 62.5 mg/dL (ref >39.00)
LDL CALC: 88 mg/dL (ref 0–99)
NONHDL: 110.63
Total CHOL/HDL Ratio: 3
Triglycerides: 114 mg/dL (ref 0.0–149.0)
VLDL: 22.8 mg/dL (ref 0.0–40.0)

## 2015-05-10 LAB — VITAMIN D 25 HYDROXY (VIT D DEFICIENCY, FRACTURES): VITD: 27.09 ng/mL — AB (ref 30.00–100.00)

## 2015-05-10 LAB — TSH: TSH: 1.9 u[IU]/mL (ref 0.35–4.50)

## 2015-05-10 LAB — VITAMIN B12: Vitamin B-12: 413 pg/mL (ref 211–911)

## 2015-05-28 ENCOUNTER — Ambulatory Visit: Payer: 59 | Admitting: Nurse Practitioner

## 2015-06-04 ENCOUNTER — Encounter: Payer: Self-pay | Admitting: Nurse Practitioner

## 2015-06-04 ENCOUNTER — Ambulatory Visit (INDEPENDENT_AMBULATORY_CARE_PROVIDER_SITE_OTHER): Payer: 59 | Admitting: Nurse Practitioner

## 2015-06-04 VITALS — BP 122/80 | HR 68 | Ht 68.0 in | Wt 171.0 lb

## 2015-06-04 DIAGNOSIS — Z0000 Encounter for general adult medical examination without abnormal findings: Secondary | ICD-10-CM | POA: Diagnosis not present

## 2015-06-04 DIAGNOSIS — G35 Multiple sclerosis: Secondary | ICD-10-CM

## 2015-06-04 DIAGNOSIS — Z01419 Encounter for gynecological examination (general) (routine) without abnormal findings: Secondary | ICD-10-CM

## 2015-06-04 DIAGNOSIS — Z1151 Encounter for screening for human papillomavirus (HPV): Secondary | ICD-10-CM | POA: Diagnosis not present

## 2015-06-04 LAB — POCT URINALYSIS DIPSTICK
BILIRUBIN UA: NEGATIVE
Glucose, UA: NEGATIVE
Ketones, UA: NEGATIVE
LEUKOCYTES UA: NEGATIVE
NITRITE UA: NEGATIVE
PH UA: 6.5
PROTEIN UA: NEGATIVE
Urobilinogen, UA: NEGATIVE

## 2015-06-04 NOTE — Patient Instructions (Signed)

## 2015-06-04 NOTE — Progress Notes (Signed)
Patient ID: Maria Jennings, female   DOB: 11-Jan-1971, 45 y.o.   MRN: [id number] 45 y.o. [id number] Married  Caucasian Fe here for annual exam.  Still on Tecfidera for MS.  Menses last for 3-4 days.  Maybe some vaginal dryness with SA.Marland Kitchen  Patient's last menstrual period was 05/29/2015 (exact date).          Sexually active: Yes.    The current method of family planning is rhythm method and coitus interruptus.    Exercising: Yes.    walking, mountain biking and workout DVD. 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 She has never used smokeless tobacco. She reports that she drinks about 1.8 - 2.4 oz of alcohol per week. She reports that she does not use illicit drugs.  Past Medical History  Diagnosis Date  . Sinusitis   . Vitamin D deficiency   . Lumbago   . Multiple sclerosis [facility])     Past Surgical History  Procedure Laterality Date  . Cesarean section      x 1  . Knee arthroscopy Right 07/2013    Current Outpatient Prescriptions  Medication Sig Dispense Refill  . Dimethyl Fumarate 240 MG CPDR Take 1 capsule by mouth 2 (two) times daily.    . Vitamin D, Ergocalciferol, (DRISDOL) 50000 UNITS CAPS capsule Take 1 capsule (50,000 Units total) by mouth every 7 (seven) days. No specific day 30 capsule 2  . pantoprazole (PROTONIX) 40 MG tablet Take 1 tablet (40 mg total) by mouth daily. (Patient not taking: Reported on 06/04/2015) 30 tablet 3   No current facility-administered medications for this visit.    Family History  Problem Relation Age of Onset  . Hypertension Mother   . Hyperlipidemia Mother   . Hypertension Father   . Cancer Maternal Grandmother 22    colon  . Cancer Cousin 52    colon    ROS:  Pertinent  items are noted in HPI.  Otherwise, a comprehensive ROS was negative.  Exam:   BP 122/80 mmHg  Pulse 68  Ht 5\' 8"  (1.727 m)  Wt 171 lb (77.565 kg)  BMI 26.01 kg/m2  LMP 05/29/2015 (Exact Date) Height: 5\' 8"  (172.7 cm) Ht Readings from Last 3 Encounters:  06/04/15 5\' 8"  (1.727 m)  06/02/14 5' 8.5" (1.74 m)  05/22/14 5\' 8"  (1.727 m)    General appearance: alert, cooperative and appears stated age Head: Normocephalic, without obvious abnormality, atraumatic Neck: no adenopathy, supple, symmetrical, trachea midline and thyroid normal to inspection and palpation Lungs: clear to auscultation bilaterally Breasts: normal appearance, no masses or tenderness Heart: regular rate and rhythm Abdomen: soft, non-tender; no masses,  no organomegaly Extremities: extremities normal, atraumatic, no cyanosis or edema Skin: Skin color, texture, turgor normal. No rashes or lesions Lymph nodes: Cervical, supraclavicular, and axillary nodes normal. No abnormal inguinal nodes palpated Neurologic: Grossly normal   Pelvic: External genitalia:  no lesions              Urethra:  normal appearing urethra with no masses, tenderness or lesions              Bartholin's and Skene's: normal  Vagina: normal appearing vagina with normal color and discharge, no lesions              Cervix: anteverted              Pap taken: Yes.   Bimanual Exam:  Uterus:  normal size, contour, position, consistency, mobility, non-tender              Adnexa: no mass, fullness, tenderness               Rectovaginal: Confirms               Anus:  normal sphincter tone, no lesions  Chaperone present: yes  A:  Well Woman with normal exam  Normal menses Withdrawal for birth control History of Vit D deficiency New Diagnosis of MS 10/2013   P:   Reviewed health and wellness pertinent to exam  Pap smear as above  Mammogram is due and will schedule  Counseled on breast self exam,  mammography screening, adequate intake of calcium and vitamin D, diet and exercise return annually or prn  An After Visit Summary was printed and given to the patient.

## 2015-06-05 LAB — IPS PAP TEST WITH HPV

## 2015-06-06 NOTE — Progress Notes (Signed)
Encounter reviewed by Dr. Brook Amundson C. Silva.  

## 2015-06-12 ENCOUNTER — Encounter: Payer: Self-pay | Admitting: Internal Medicine

## 2015-06-12 ENCOUNTER — Ambulatory Visit (INDEPENDENT_AMBULATORY_CARE_PROVIDER_SITE_OTHER): Payer: 59 | Admitting: Internal Medicine

## 2015-06-12 VITALS — BP 130/88 | HR 69 | Temp 97.6°F | Ht 67.5 in | Wt 173.1 lb

## 2015-06-12 DIAGNOSIS — Z0000 Encounter for general adult medical examination without abnormal findings: Secondary | ICD-10-CM | POA: Diagnosis not present

## 2015-06-12 NOTE — Progress Notes (Signed)
Pre visit review using our clinic review tool, if applicable. No additional management support is needed unless otherwise documented below in the visit note. 

## 2015-06-12 NOTE — Progress Notes (Signed)
Subjective:    Patient ID: Maria Jennings, female    DOB: 1970-07-27, 45 y.o.   MRN: [id number]  HPI  45YO female presents for physical exam.  Feeling well. Never took Pantoprazole. Taking Vit D 5000 twice weekly. Recent level was 27.09.  Physically active. Running some. Occasional right knee pain. Working to strengthen knee. Exercising 5 days per week.   Wt Readings from Last 3 Encounters:  06/12/15 173 lb 2 oz (78.529 kg)  06/04/15 171 lb (77.565 kg)  11/23/14 159 lb 9.6 oz (72.394 kg)   BP Readings from Last 3 Encounters:  06/12/15 130/88  06/04/15 122/80  11/23/14 124/68    Past Medical History  Diagnosis Date  . Sinusitis   . Vitamin D deficiency   . Lumbago   . Multiple sclerosis (Grandfather)    Family History  Problem Relation Age of Onset  . Hypertension Mother   . Hyperlipidemia Mother   . Hypertension Father   . Cancer Maternal Grandmother 47    colon  . Cancer Cousin 38    colon   Past Surgical History  Procedure Laterality Date  . Cesarean section      x 1  . Knee arthroscopy Right 07/2013   Social History   Social History  . Marital Status: Married    Spouse Name: N/A  . Number of Children: 2  . Years of Education: N/A   Occupational History  .     Social History Main Topics  . Smoking status: Never Smoker   . Smokeless tobacco: Never Used  . Alcohol Use: 1.8 - 2.4 oz/week    3-4 Glasses of wine per week     Comment: occasional  . Drug Use: No  . Sexual Activity:    Partners: Male    Birth Control/ Protection: None   Other Topics Concern  . None   Social History Narrative   Lives in [place].      Work - Psychologist, forensic    Review of Systems  Constitutional: Negative for fever, chills, appetite change, fatigue and unexpected weight change.  Eyes: Negative for visual disturbance.  Respiratory: Negative for shortness of breath.   Cardiovascular: Negative for chest pain and leg swelling.  Gastrointestinal: Negative for nausea,  vomiting, abdominal pain, diarrhea and constipation.  Musculoskeletal: Positive for myalgias and arthralgias.  Skin: Negative for color change and rash.  Hematological: Negative for adenopathy. Does not bruise/bleed easily.  Psychiatric/Behavioral: Negative for suicidal ideas, sleep disturbance and dysphoric mood. The patient is not nervous/anxious.        Objective:    BP 130/88 mmHg  Pulse 69  Temp(Src) 97.6 F (36.4 C) (Oral)  Ht 5' 7.5" (1.715 m)  Wt 173 lb 2 oz (78.529 kg)  BMI 26.70 kg/m2  SpO2 100%  LMP 05/29/2015 (Exact Date) Physical Exam  Constitutional: She is oriented to person, place, and time. She appears well-developed and well-nourished. No distress.  HENT:  Head: Normocephalic and atraumatic.  Right Ear: External ear normal.  Left Ear: External ear normal.  Nose: Nose normal.  Mouth/Throat: Oropharynx is clear and moist. No oropharyngeal exudate.  Eyes: Conjunctivae and EOM are normal. Pupils are equal, round, and reactive to light. Right eye exhibits no discharge.  Neck: Normal range of motion. Neck supple. No thyromegaly present.  Cardiovascular: Normal rate, regular rhythm, normal heart sounds and intact distal pulses.  Exam reveals no gallop and no friction rub.   No murmur heard. Pulmonary/Chest: Effort normal. No respiratory distress. She  has no wheezes. She has no rales.  Abdominal: Soft. Bowel sounds are normal. She exhibits no distension and no mass. There is no tenderness. There is no rebound and no guarding.  Musculoskeletal: Normal range of motion. She exhibits no edema or tenderness.  Lymphadenopathy:    She has no cervical adenopathy.  Neurological: She is alert and oriented to person, place, and time. No cranial nerve deficit. Coordination normal.  Skin: Skin is warm and dry. No rash noted. She is not diaphoretic. No erythema. No pallor.  Psychiatric: She has a normal mood and affect. Her behavior is normal. Judgment and thought content normal.           Assessment & Plan:   Problem List Items Addressed This Visit      Unprioritized   Routine general medical examination at a health care facility - Primary    General medical exam normal today. Breast and pelvic exam deferred as normal with her OB in 05/2015. Mammogram to be scheduled this summer. Flu vaccine deferred because of MS and treatment. Labs reviewed. Encouraged continued healthy diet and exercise.          Return in about 6 months (around 12/10/2015) for Recheck.  Ronette Deter, MD [facility]

## 2015-06-12 NOTE — Assessment & Plan Note (Signed)
General medical exam normal today. Breast and pelvic exam deferred as normal with her OB in 05/2015. Mammogram to be scheduled this summer. Flu vaccine deferred because of MS and treatment. Labs reviewed. Encouraged continued healthy diet and exercise.

## 2015-06-12 NOTE — Patient Instructions (Signed)
Health Maintenance, Female Adopting a healthy lifestyle and getting preventive care can go a long way to promote health and wellness. Talk with your health care provider about what schedule of regular examinations is right for you. This is a good chance for you to check in with your provider about disease prevention and staying healthy. In between checkups, there are plenty of things you can do on your own. Experts have done a lot of research about which lifestyle changes and preventive measures are most likely to keep you healthy. Ask your health care provider for more information. WEIGHT AND DIET  Eat a healthy diet  Be sure to include plenty of vegetables, fruits, low-fat dairy products, and lean protein.  Do not eat a lot of foods high in solid fats, added sugars, or salt.  Get regular exercise. This is one of the most important things you can do for your health.  Most adults should exercise for at least 150 minutes each week. The exercise should increase your heart rate and make you sweat (moderate-intensity exercise).  Most adults should also do strengthening exercises at least twice a week. This is in addition to the moderate-intensity exercise.  Maintain a healthy weight  Body mass index (BMI) is a measurement that can be used to identify possible weight problems. It estimates body fat based on height and weight. Your health care provider can help determine your BMI and help you achieve or maintain a healthy weight.  For females 20 years of age and older:   A BMI below 18.5 is considered underweight.  A BMI of 18.5 to 24.9 is normal.  A BMI of 25 to 29.9 is considered overweight.  A BMI of 30 and above is considered obese.  Watch levels of cholesterol and blood lipids  You should start having your blood tested for lipids and cholesterol at 47 years of age, then have this test every 5 years.  You may need to have your cholesterol levels checked more often if:  Your lipid  or cholesterol levels are high.  You are older than 47 years of age.  You are at high risk for heart disease.  CANCER SCREENING   Lung Cancer  Lung cancer screening is recommended for adults 55-80 years old who are at high risk for lung cancer because of a history of smoking.  A yearly low-dose CT scan of the lungs is recommended for people who:  Currently smoke.  Have quit within the past 15 years.  Have at least a 30-pack-year history of smoking. A pack year is smoking an average of one pack of cigarettes a day for 1 year.  Yearly screening should continue until it has been 15 years since you quit.  Yearly screening should stop if you develop a health problem that would prevent you from having lung cancer treatment.  Breast Cancer  Practice breast self-awareness. This means understanding how your breasts normally appear and feel.  It also means doing regular breast self-exams. Let your health care provider know about any changes, no matter how small.  If you are in your 20s or 30s, you should have a clinical breast exam (CBE) by a health care provider every 1-3 years as part of a regular health exam.  If you are 40 or older, have a CBE every year. Also consider having a breast X-ray (mammogram) every year.  If you have a family history of breast cancer, talk to your health care provider about genetic screening.  If you   are at high risk for breast cancer, talk to your health care provider about having an MRI and a mammogram every year.  Breast cancer gene (BRCA) assessment is recommended for women who have family members with BRCA-related cancers. BRCA-related cancers include:  Breast.  Ovarian.  Tubal.  Peritoneal cancers.  Results of the assessment will determine the need for genetic counseling and BRCA1 and BRCA2 testing. Cervical Cancer Your health care provider may recommend that you be screened regularly for cancer of the pelvic organs (ovaries, uterus, and  vagina). This screening involves a pelvic examination, including checking for microscopic changes to the surface of your cervix (Pap test). You may be encouraged to have this screening done every 3 years, beginning at age 21.  For women ages 30-65, health care providers may recommend pelvic exams and Pap testing every 3 years, or they may recommend the Pap and pelvic exam, combined with testing for human papilloma virus (HPV), every 5 years. Some types of HPV increase your risk of cervical cancer. Testing for HPV may also be done on women of any age with unclear Pap test results.  Other health care providers may not recommend any screening for nonpregnant women who are considered low risk for pelvic cancer and who do not have symptoms. Ask your health care provider if a screening pelvic exam is right for you.  If you have had past treatment for cervical cancer or a condition that could lead to cancer, you need Pap tests and screening for cancer for at least 20 years after your treatment. If Pap tests have been discontinued, your risk factors (such as having a new sexual partner) need to be reassessed to determine if screening should resume. Some women have medical problems that increase the chance of getting cervical cancer. In these cases, your health care provider may recommend more frequent screening and Pap tests. Colorectal Cancer  This type of cancer can be detected and often prevented.  Routine colorectal cancer screening usually begins at 47 years of age and continues through 47 years of age.  Your health care provider may recommend screening at an earlier age if you have risk factors for colon cancer.  Your health care provider may also recommend using home test kits to check for hidden blood in the stool.  A small camera at the end of a tube can be used to examine your colon directly (sigmoidoscopy or colonoscopy). This is done to check for the earliest forms of colorectal  cancer.  Routine screening usually begins at age 50.  Direct examination of the colon should be repeated every 5-10 years through 47 years of age. However, you may need to be screened more often if early forms of precancerous polyps or small growths are found. Skin Cancer  Check your skin from head to toe regularly.  Tell your health care provider about any new moles or changes in moles, especially if there is a change in a mole's shape or color.  Also tell your health care provider if you have a mole that is larger than the size of a pencil eraser.  Always use sunscreen. Apply sunscreen liberally and repeatedly throughout the day.  Protect yourself by wearing long sleeves, pants, a wide-brimmed hat, and sunglasses whenever you are outside. HEART DISEASE, DIABETES, AND HIGH BLOOD PRESSURE   High blood pressure causes heart disease and increases the risk of stroke. High blood pressure is more likely to develop in:  People who have blood pressure in the high end   of the normal range (130-139/85-89 mm Hg).  People who are overweight or obese.  People who are African American.  If you are 38-23 years of age, have your blood pressure checked every 3-5 years. If you are 61 years of age or older, have your blood pressure checked every year. You should have your blood pressure measured twice--once when you are at a hospital or clinic, and once when you are not at a hospital or clinic. Record the average of the two measurements. To check your blood pressure when you are not at a hospital or clinic, you can use:  An automated blood pressure machine at a pharmacy.  A home blood pressure monitor.  If you are between 45 years and 39 years old, ask your health care provider if you should take aspirin to prevent strokes.  Have regular diabetes screenings. This involves taking a blood sample to check your fasting blood sugar level.  If you are at a normal weight and have a low risk for diabetes,  have this test once every three years after 47 years of age.  If you are overweight and have a high risk for diabetes, consider being tested at a younger age or more often. PREVENTING INFECTION  Hepatitis B  If you have a higher risk for hepatitis B, you should be screened for this virus. You are considered at high risk for hepatitis B if:  You were born in a country where hepatitis B is common. Ask your health care provider which countries are considered high risk.  Your parents were born in a high-risk country, and you have not been immunized against hepatitis B (hepatitis B vaccine).  You have HIV or AIDS.  You use needles to inject street drugs.  You live with someone who has hepatitis B.  You have had sex with someone who has hepatitis B.  You get hemodialysis treatment.  You take certain medicines for conditions, including cancer, organ transplantation, and autoimmune conditions. Hepatitis C  Blood testing is recommended for:  Everyone born from [birth year] through 1967.  Anyone with known risk factors for hepatitis C. Sexually transmitted infections (STIs)  You should be screened for sexually transmitted infections (STIs) including gonorrhea and chlamydia if:  You are sexually active and are younger than 47 years of age.  You are older than 47 years of age and your health care provider tells you that you are at risk for this type of infection.  Your sexual activity has changed since you were last screened and you are at an increased risk for chlamydia or gonorrhea. Ask your health care provider if you are at risk.  If you do not have HIV, but are at risk, it may be recommended that you take a prescription medicine daily to prevent HIV infection. This is called pre-exposure prophylaxis (PrEP). You are considered at risk if:  You are sexually active and do not regularly use condoms or know the HIV status of your partner(s).  You take drugs by injection.  You are sexually  active with a partner who has HIV. Talk with your health care provider about whether you are at high risk of being infected with HIV. If you choose to begin PrEP, you should first be tested for HIV. You should then be tested every 3 months for as long as you are taking PrEP.  PREGNANCY   If you are premenopausal and you may become pregnant, ask your health care provider about preconception counseling.  If you may  become pregnant, take 400 to 800 micrograms (mcg) of folic acid every day.  If you want to prevent pregnancy, talk to your health care provider about birth control (contraception). OSTEOPOROSIS AND MENOPAUSE   Osteoporosis is a disease in which the bones lose minerals and strength with aging. This can result in serious bone fractures. Your risk for osteoporosis can be identified using a bone density scan.  If you are 61 years of age or older, or if you are at risk for osteoporosis and fractures, ask your health care provider if you should be screened.  Ask your health care provider whether you should take a calcium or vitamin D supplement to lower your risk for osteoporosis.  Menopause may have certain physical symptoms and risks.  Hormone replacement therapy may reduce some of these symptoms and risks. Talk to your health care provider about whether hormone replacement therapy is right for you.  HOME CARE INSTRUCTIONS   Schedule regular health, dental, and eye exams.  Stay current with your immunizations.   Do not use any tobacco products including cigarettes, chewing tobacco, or electronic cigarettes.  If you are pregnant, do not drink alcohol.  If you are breastfeeding, limit how much and how often you drink alcohol.  Limit alcohol intake to no more than 1 drink per day for nonpregnant women. One drink equals 12 ounces of beer, 5 ounces of wine, or 1 ounces of hard liquor.  Do not use street drugs.  Do not share needles.  Ask your health care provider for help if  you need support or information about quitting drugs.  Tell your health care provider if you often feel depressed.  Tell your health care provider if you have ever been abused or do not feel safe at home.   This information is not intended to replace advice given to you by your health care provider. Make sure you discuss any questions you have with your health care provider.   Document Released: 10/14/2010 Document Revised: 04/21/2014 Document Reviewed: 03/02/2013 Elsevier Interactive Patient Education [employer].

## 2015-07-06 ENCOUNTER — Encounter: Payer: Self-pay | Admitting: *Deleted

## 2015-07-06 ENCOUNTER — Emergency Department: Payer: 59

## 2015-07-06 ENCOUNTER — Observation Stay
Admission: EM | Admit: 2015-07-06 | Discharge: 2015-07-07 | Disposition: A | Discharge status: Home / Self Care | Payer: 59 | Attending: Internal Medicine | Admitting: Internal Medicine

## 2015-07-06 DIAGNOSIS — Z8249 Family history of ischemic heart disease and other diseases of the circulatory system: Secondary | ICD-10-CM | POA: Insufficient documentation

## 2015-07-06 DIAGNOSIS — G35 Multiple sclerosis: Secondary | ICD-10-CM | POA: Insufficient documentation

## 2015-07-06 DIAGNOSIS — R9431 Abnormal electrocardiogram [ECG] [EKG]: Secondary | ICD-10-CM | POA: Diagnosis not present

## 2015-07-06 DIAGNOSIS — R0609 Other forms of dyspnea: Secondary | ICD-10-CM

## 2015-07-06 DIAGNOSIS — R002 Palpitations: Secondary | ICD-10-CM | POA: Diagnosis not present

## 2015-07-06 DIAGNOSIS — Z881 Allergy status to other antibiotic agents status: Secondary | ICD-10-CM | POA: Diagnosis not present

## 2015-07-06 DIAGNOSIS — R079 Chest pain, unspecified: Secondary | ICD-10-CM | POA: Diagnosis not present

## 2015-07-06 DIAGNOSIS — Z8349 Family history of other endocrine, nutritional and metabolic diseases: Secondary | ICD-10-CM | POA: Diagnosis not present

## 2015-07-06 DIAGNOSIS — Z800 Family history of malignant neoplasm of digestive organs: Secondary | ICD-10-CM | POA: Diagnosis not present

## 2015-07-06 DIAGNOSIS — R0789 Other chest pain: Secondary | ICD-10-CM | POA: Diagnosis not present

## 2015-07-06 LAB — BASIC METABOLIC PANEL
Anion gap: 7 (ref 5–15)
BUN: 11 mg/dL (ref 6–20)
CHLORIDE: 104 mmol/L (ref 101–111)
CO2: 24 mmol/L (ref 22–32)
Calcium: 9.3 mg/dL (ref 8.9–10.3)
Creatinine, Ser: 0.41 mg/dL — ABNORMAL LOW (ref 0.44–1.00)
GFR calc Af Amer: >60 mL/min (ref >60)
GFR calc non Af Amer: >60 mL/min (ref >60)
GLUCOSE: 104 mg/dL — AB (ref 65–99)
POTASSIUM: 3.1 mmol/L — AB (ref 3.5–5.1)
Sodium: 135 mmol/L (ref 135–145)

## 2015-07-06 LAB — CBC
HEMATOCRIT: 40.2 % (ref 35.0–47.0)
Hemoglobin: 13.7 g/dL (ref 12.0–16.0)
MCH: 30.4 pg (ref 26.0–34.0)
MCHC: 34.1 g/dL (ref 32.0–36.0)
MCV: 89.1 fL (ref 80.0–100.0)
Platelets: 236 10*3/uL (ref 150–440)
RBC: 4.51 MIL/uL (ref 3.80–5.20)
RDW: 13.1 % (ref 11.5–14.5)
WBC: 10 10*3/uL (ref 3.6–11.0)

## 2015-07-06 LAB — TROPONIN I: Troponin I: <0.03 ng/mL (ref <0.031)

## 2015-07-06 NOTE — ED Notes (Signed)
Patient transported to X-ray 

## 2015-07-06 NOTE — ED Provider Notes (Signed)
[facility] Emergency Department Provider Note  ____________________________________________  Time seen: Approximately 8:56 PM  I have reviewed the triage vital signs and the nursing notes.   HISTORY  Chief Complaint Chest Pain and Palpitations   HPI Maria Jennings is a 45 y.o. female patient reports she was standing in the kitchen and got sudden onset of chest pressure in the center of her chest only and then she began having palpitations. Palpitations lasted possibly 5 minutes the chest pressure lasted about 10. She took her blood pressure was high [id number] systolic symptoms resolved after about 10 minutes of resting chair. She also had some left arm feeling of weakness. Patient also reports last few weeks or possibly longer she's been having some shortness of breath when she walks with her husband. Her only other diagnosis is multiple sclerosis. She reports she was running up until December without any difficulty.   Past Medical History  Diagnosis Date  . Sinusitis   . Vitamin D deficiency   . Lumbago   . Multiple sclerosis [facility])     Patient Active Problem List   Diagnosis Date Noted  . 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 Anterior optic neuritis 11/04/2013  . Routine general medical examination at a health care facility 08/16/2012    Past Surgical History  Procedure Laterality Date  . Cesarean section      x 1  . Knee arthroscopy Right 07/2013    Current Outpatient Rx  Name  Route  Sig  Dispense  Refill  . cyanocobalamin 1000 MCG tablet   Oral   Take 1,000 mcg by mouth 2 (two) times a week.         . Dimethyl Fumarate 240 MG CPDR   Oral   Take 120 mg by mouth 2 (two) times daily.            Allergies Azithromycin and Ciprofloxacin hcl  Family History  Problem Relation Age of Onset  . Hypertension Mother   . Hyperlipidemia Mother   . Hypertension Father   . Cancer Maternal Grandmother 69    colon  . Cancer Cousin 57   colon    Social History Social History  Substance Use Topics  . Smoking status: Never Smoker   . Smokeless tobacco: Never Used  . Alcohol Use: 1.8 - 2.4 oz/week    3-4 Glasses of wine per week     Comment: occasional    Review of Systems Constitutional: No fever/chills Eyes: No visual changes. ENT: No sore throat. Cardiovascular: See history of present illness Respiratory: Denies shortness of breath. Gastrointestinal: No abdominal pain.  No nausea, no vomiting.  No diarrhea.  No constipation. Genitourinary: Negative for dysuria. Musculoskeletal: Negative for back pain. Skin: Negative for rash. Neurological: Negative for headaches, focal weakness or numbness.  10-point ROS otherwise negative.  ____________________________________________   PHYSICAL EXAM:  VITAL SIGNS: ED Triage Vitals  Enc Vitals Group     BP 07/06/15 2030 183/90 mmHg     Pulse Rate 07/06/15 2030 95     Resp 07/06/15 2030 18     Temp 07/06/15 2030 98 F (36.7 C)     Temp Source 07/06/15 2030 Oral     SpO2 07/06/15 2030 100 %     Weight --      Height --      Head Cir --      Peak Flow --      Pain Score 07/06/15 2028 0     Pain  Loc --      Pain Edu? --      Excl. in [place]? --     Constitutional: Alert and oriented. Well appearing and in no acute distress. Eyes: Conjunctivae are normal. PERRL. EOMI. Head: Atraumatic. Nose: No congestion/rhinnorhea. Mouth/Throat: Mucous membranes are moist.  Oropharynx non-erythematous. Neck: No stridor.   Cardiovascular: Normal rate, regular rhythm. Grossly normal heart sounds.  Good peripheral circulation. Respiratory: Normal respiratory effort.  No retractions. Lungs CTAB. Gastrointestinal: Soft and nontender. No distention. No abdominal bruits. No CVA tenderness. Musculoskeletal: No lower extremity tenderness nor edema.  No joint effusions. Skin:  Skin is warm, dry and intact. No rash noted. Psychiatric: Mood and affect are normal. Speech and behavior are  normal.  ____________________________________________   LABS (all labs ordered are listed, but only abnormal results are displayed)  Labs Reviewed  BASIC METABOLIC PANEL - Abnormal; Notable for the following:    Potassium 3.1 (*)    Glucose, Bld 104 (*)    Creatinine, Ser 0.41 (*)    All other components within normal limits  CBC  TROPONIN I   ____________________________________________  EKG  EKG read and interpreted by me shows normal sinus rhythm rate of 97 normal axis there is some ST segment downsloping in 1, 2 f, V5 and 6 which was not present on an EKG we have from 08/16/2012 ____________________________________________  RADIOLOGY  Radiology reads the chest x-ray as no active cardiopulmonary disease ____________________________________________   PROCEDURES    ____________________________________________   INITIAL IMPRESSION / ASSESSMENT AND PLAN / ED COURSE  Pertinent labs & imaging results that were available during my care of the patient were reviewed by me and considered in my medical decision making (see chart for details).  ____________________________________________   FINAL CLINICAL IMPRESSION(S) / ED DIAGNOSES  Final diagnoses:  Chest pain, unspecified chest pain type  Abnormal EKG      Nena Polio, MD 07/06/15 2135

## 2015-07-06 NOTE — ED Notes (Signed)
Pt c/o central chest pressure lasting x 5 minutes followed by sensation of palpitations. Pt states afterward her L arm felt weak. Pt denies cardiac hx. Pt diagnosed w/ MS approximately a year ago. Pt states her meds for the MS can cause gastric upset but nothing like what she experienced today.

## 2015-07-06 NOTE — H&P (Signed)
[facility] [place] NAME: Maria Jennings    MR#:  [id number]  DATE OF BIRTH:  01-16-1971  DATE OF ADMISSION:  07/06/2015  PRIMARY CARE PHYSICIAN: Rica Mast, MD   REQUESTING/REFERRING PHYSICIAN: Cinda Quest, MD  CHIEF COMPLAINT:   Chief Complaint  Patient presents with  . Chest Pain  . Palpitations    HISTORY OF PRESENT ILLNESS:  Maria Jennings  is a 45 y.o. female who presents with An episode of palpitations and chest pain. Patient states that she was in her kitchen tonight and had an episode of palpitations which lasted about 5 minutes in conjunction with chest pressure which lasted for about 10 minutes. She has never had anything like this happen to her before. She has no significant family history of cardiac disease. She is otherwise fairly healthy individual who exercises regularly and has recent workups including good cholesterol good blood pressure. She does have a diagnosis of MS, this is relatively recent, diagnosed about one year ago, and seems to be under good control. Here in the ED she had a negative troponin 1, ED physician felt like she may have had some subtle changes on her EKG, specifically T-wave changes in her inferolateral leads. Hospitalists were called for further evaluation and admission for her chest pain.  PAST MEDICAL HISTORY:   Past Medical History  Diagnosis Date  . Sinusitis   . Vitamin D deficiency   . Lumbago   . Multiple sclerosis ([place])     PAST SURGICAL HISTORY:   Past Surgical History  Procedure Laterality Date  . Cesarean section      x 1  . Knee arthroscopy Right 07/2013    SOCIAL HISTORY:   Social History  Substance Use Topics  . Smoking status: Never Smoker   . Smokeless tobacco: Never Used  . Alcohol Use: 1.8 - 2.4 oz/week    3-4 Glasses of wine per week     Comment: occasional    FAMILY HISTORY:   Family History  Problem Relation Age of Onset  . Hypertension  Mother   . Hyperlipidemia Mother   . Hypertension Father   . Cancer Maternal Grandmother 32    colon  . Cancer Cousin 52    colon    DRUG ALLERGIES:   Allergies  Allergen Reactions  . Azithromycin Hives  . Ciprofloxacin Hcl Rash    MEDICATIONS AT HOME:   Prior to Admission medications   Medication Sig Start Date End Date Taking? Authorizing Provider  cyanocobalamin 1000 MCG tablet Take 1,000 mcg by mouth 2 (two) times a week.   Yes Historical Provider, MD  Dimethyl Fumarate 240 MG CPDR Take 120 mg by mouth 2 (two) times daily.    Yes Historical Provider, MD    REVIEW OF SYSTEMS:  Review of Systems  Constitutional: Negative for fever, chills, weight loss and malaise/fatigue.  HENT: Negative for ear pain, hearing loss and tinnitus.   Eyes: Negative for blurred vision, double vision, pain and redness.  Respiratory: Negative for cough, hemoptysis and shortness of breath.   Cardiovascular: Positive for chest pain and palpitations. Negative for orthopnea and leg swelling.  Gastrointestinal: Negative for nausea, vomiting, abdominal pain, diarrhea and constipation.  Genitourinary: Negative for dysuria, frequency and hematuria.  Musculoskeletal: Negative for back pain, joint pain and neck pain.  Skin:       No acne, rash, or lesions  Neurological: Negative for dizziness, tremors, focal weakness and weakness.  Endo/Heme/Allergies: Negative for polydipsia.  Does not bruise/bleed easily.  Psychiatric/Behavioral: Negative for depression. The patient is not nervous/anxious and does not have insomnia.      VITAL SIGNS:   Filed Vitals:   07/06/15 2030 07/06/15 2120 07/06/15 2130 07/06/15 2200  BP: 183/90 141/94 133/93 137/87  Pulse: 95 95 98 95  Temp: 98 F (36.7 C)     TempSrc: Oral     Resp: 18 18 12 14   SpO2: 100% 100% 100% 99%   Wt Readings from Last 3 Encounters:  06/12/15 78.529 kg (173 lb 2 oz)  06/04/15 77.565 kg (171 lb)  11/23/14 72.394 kg (159 lb 9.6 oz)     PHYSICAL EXAMINATION:  Physical Exam  Vitals reviewed. Constitutional: She is oriented to person, place, and time. She appears well-developed and well-nourished. No distress.  HENT:  Head: Normocephalic and atraumatic.  Mouth/Throat: Oropharynx is clear and moist.  Eyes: Conjunctivae and EOM are normal. Pupils are equal, round, and reactive to light. No scleral icterus.  Neck: Normal range of motion. Neck supple. No JVD present. No thyromegaly present.  Cardiovascular: Normal rate, regular rhythm and intact distal pulses.  Exam reveals no gallop and no friction rub.   No murmur heard. Respiratory: Effort normal and breath sounds normal. No respiratory distress. She has no wheezes. She has no rales.  GI: Soft. Bowel sounds are normal. She exhibits no distension. There is no tenderness.  Musculoskeletal: Normal range of motion. She exhibits no edema.  No arthritis, no gout  Lymphadenopathy:    She has no cervical adenopathy.  Neurological: She is alert and oriented to person, place, and time. No cranial nerve deficit.  No dysarthria, no aphasia  Skin: Skin is warm and dry. No rash noted. No erythema.  Psychiatric: She has a normal mood and affect. Her behavior is normal. Judgment and thought content normal.    LABORATORY PANEL:   CBC  Recent Labs Lab 07/06/15 2043  WBC 10.0  HGB 13.7  HCT 40.2  PLT 236   ------------------------------------------------------------------------------------------------------------------  Chemistries   Recent Labs Lab 07/06/15 2043  NA 135  K 3.1*  CL 104  CO2 24  GLUCOSE 104*  BUN 11  CREATININE 0.41*  CALCIUM 9.3   ------------------------------------------------------------------------------------------------------------------  Cardiac Enzymes  Recent Labs Lab 07/06/15 2043  TROPONINI <0.03   ------------------------------------------------------------------------------------------------------------------  RADIOLOGY:   Dg Chest 2 View  07/06/2015  CLINICAL DATA:  Chest pain EXAM: CHEST  2 VIEW COMPARISON:  None. FINDINGS: The heart size and mediastinal contours are within normal limits. Both lungs are clear. Mild degenerative changes mid thoracic spine. IMPRESSION: No active cardiopulmonary disease. Electronically Signed   By: Lahoma Crocker M.D.   On: 07/06/2015 21:09    EKG:   Orders placed or performed during the hospital encounter of 07/06/15  . EKG 12-Lead  . EKG 12-Lead  . ED EKG within 10 minutes  . ED EKG within 10 minutes    IMPRESSION AND PLAN:  Principal Problem:   Chest pain - episode spontaneously resolved. Currently chest pain free. First cardiac enzyme negative. We will trend serially tonight and get echocardiogram in the morning. Patient has very low risk for cardiac disease. We'll defer cardiology consult for now unless her enzymes become abnormal or her echocardiogram is abnormal. Active Problems:   DOE (dyspnea on exertion) - patient states that over the last couple of months she has been getting short of breath walking up hills with her husband. She states that she has not exercise for a few months  significantly, but prior to that exercise avidly. Potentially related to cardiac cause versus deconditioning, workup as above.   Multiple sclerosis ([place]) - stable on current medication, continue home meds  All the records are reviewed and case discussed with ED provider. Management plans discussed with the patient and/or family.  DVT PROPHYLAXIS: SubQ lovenox  GI PROPHYLAXIS: None  ADMISSION STATUS: Observation  CODE STATUS: Full Code Status History    This patient does not have a recorded code status. Please follow your organizational policy for patients in this situation.      TOTAL TIME TAKING CARE OF THIS PATIENT: 40 minutes.    Jagger Demonte [place] 07/06/2015, 10:37 PM  [facility]  Office  [phone]  CC: Primary care physician; Rica Mast, MD

## 2015-07-06 NOTE — ED Notes (Addendum)
Pt reports hx of MS and hypertension. Pt reports episode today of chest pressure (left chest), with radiation down left arm, SOB, and lightheadedness.  PT states: "My heart felt fluttery"

## 2015-07-07 ENCOUNTER — Observation Stay: Admit: 2015-07-07 | Payer: 59

## 2015-07-07 DIAGNOSIS — Z881 Allergy status to other antibiotic agents status: Secondary | ICD-10-CM | POA: Diagnosis not present

## 2015-07-07 DIAGNOSIS — Z8249 Family history of ischemic heart disease and other diseases of the circulatory system: Secondary | ICD-10-CM | POA: Diagnosis not present

## 2015-07-07 DIAGNOSIS — R0609 Other forms of dyspnea: Secondary | ICD-10-CM | POA: Diagnosis not present

## 2015-07-07 DIAGNOSIS — Z8349 Family history of other endocrine, nutritional and metabolic diseases: Secondary | ICD-10-CM | POA: Diagnosis not present

## 2015-07-07 DIAGNOSIS — R002 Palpitations: Secondary | ICD-10-CM | POA: Diagnosis not present

## 2015-07-07 DIAGNOSIS — R0789 Other chest pain: Secondary | ICD-10-CM | POA: Diagnosis not present

## 2015-07-07 DIAGNOSIS — R9431 Abnormal electrocardiogram [ECG] [EKG]: Secondary | ICD-10-CM | POA: Diagnosis not present

## 2015-07-07 DIAGNOSIS — G35 Multiple sclerosis: Secondary | ICD-10-CM | POA: Diagnosis not present

## 2015-07-07 DIAGNOSIS — Z800 Family history of malignant neoplasm of digestive organs: Secondary | ICD-10-CM | POA: Diagnosis not present

## 2015-07-07 DIAGNOSIS — R079 Chest pain, unspecified: Secondary | ICD-10-CM | POA: Diagnosis not present

## 2015-07-07 LAB — BASIC METABOLIC PANEL
Anion gap: 2 — ABNORMAL LOW (ref 5–15)
BUN: 9 mg/dL (ref 6–20)
CALCIUM: 8.6 mg/dL — AB (ref 8.9–10.3)
CO2: 26 mmol/L (ref 22–32)
Chloride: 108 mmol/L (ref 101–111)
Creatinine, Ser: 0.35 mg/dL — ABNORMAL LOW (ref 0.44–1.00)
GFR calc Af Amer: >60 mL/min (ref >60)
GLUCOSE: 85 mg/dL (ref 65–99)
POTASSIUM: 3.5 mmol/L (ref 3.5–5.1)
Sodium: 136 mmol/L (ref 135–145)

## 2015-07-07 LAB — CBC
HEMATOCRIT: 36.3 % (ref 35.0–47.0)
HEMOGLOBIN: 12.5 g/dL (ref 12.0–16.0)
MCH: 30.6 pg (ref 26.0–34.0)
MCHC: 34.4 g/dL (ref 32.0–36.0)
MCV: 88.7 fL (ref 80.0–100.0)
Platelets: 201 10*3/uL (ref 150–440)
RBC: 4.09 MIL/uL (ref 3.80–5.20)
RDW: 13.2 % (ref 11.5–14.5)
WBC: 5.7 10*3/uL (ref 3.6–11.0)

## 2015-07-07 LAB — TROPONIN I: Troponin I: <0.03 ng/mL (ref <0.031)

## 2015-07-07 MED ORDER — VITAMIN B-12 1000 MCG PO TABS: 1000.0000 ug | ORAL_TABLET | ORAL | Status: DC

## 2015-07-07 MED ORDER — SODIUM CHLORIDE 0.9% FLUSH
3.0000 mL | Freq: Two times a day (BID) | INTRAVENOUS | Status: DC
  Administered 2015-07-07: 3 mL via INTRAVENOUS

## 2015-07-07 MED ORDER — ONDANSETRON HCL 4 MG PO TABS: 4.0000 mg | ORAL_TABLET | Freq: Four times a day (QID) | ORAL | Status: DC | PRN

## 2015-07-07 MED ORDER — DIMETHYL FUMARATE 240 MG PO CPDR: 120.0000 mg | DELAYED_RELEASE_CAPSULE | Freq: Two times a day (BID) | ORAL | Status: DC

## 2015-07-07 MED ORDER — ACETAMINOPHEN 650 MG RE SUPP: 650.0000 mg | Freq: Four times a day (QID) | RECTAL | Status: DC | PRN

## 2015-07-07 MED ORDER — ACETAMINOPHEN 325 MG PO TABS: 650.0000 mg | ORAL_TABLET | Freq: Four times a day (QID) | ORAL | Status: DC | PRN

## 2015-07-07 MED ORDER — ONDANSETRON HCL 4 MG/2ML IJ SOLN: 4.0000 mg | Freq: Four times a day (QID) | INTRAMUSCULAR | Status: DC | PRN

## 2015-07-07 MED ORDER — ENOXAPARIN SODIUM 40 MG/0.4ML ~~LOC~~ SOLN: 40.0000 mg | SUBCUTANEOUS | Status: DC

## 2015-07-07 NOTE — Discharge Summary (Signed)
[facility] [place] NAME: Maria Jennings    MR#:  [id number]  DATE OF BIRTH:  1970-07-27  DATE OF ADMISSION:  07/06/2015 ADMITTING PHYSICIAN: Lance Coon, MD  DATE OF DISCHARGE: No discharge date for patient encounter.  PRIMARY CARE PHYSICIAN: Rica Mast, MD    ADMISSION DIAGNOSIS:  Abnormal EKG [R94.31] Chest pain, unspecified chest pain type [R07.9]  DISCHARGE DIAGNOSIS:  Atypical chest pain  SECONDARY DIAGNOSIS:   Past Medical History  Diagnosis Date  . Sinusitis   . Vitamin D deficiency   . Lumbago   . Multiple sclerosis [facility])     HOSPITAL COURSE:  Maria Jennings  is a 45 y.o. female admitted 07/06/2015 with chief complaint chest pain with palpitations. She is admitted to the telemetry floor without further evidence of abnormal rhythm, cardiac enzymes trended and remained within normal limits. She had no further episodes of chest pain or palpitations for the duration of her stay. Given symptoms have resolved and no findings suggestive of worrisome illness patient is stable and ready for discharge.  DISCHARGE CONDITIONS:   Stable/improved  CONSULTS OBTAINED:  Treatment Team:  Lytle Butte, MD  DRUG ALLERGIES:   Allergies  Allergen Reactions  . Azithromycin Hives  . Ciprofloxacin Hcl Rash    DISCHARGE MEDICATIONS:   Current Discharge Medication List    CONTINUE these medications which have NOT CHANGED   Details  cyanocobalamin 1000 MCG tablet Take 1,000 mcg by mouth 2 (two) times a week.    Dimethyl Fumarate 240 MG CPDR Take 120 mg by mouth 2 (two) times daily.          DISCHARGE INSTRUCTIONS:    DIET:  Regular diet  DISCHARGE CONDITION:  Good  ACTIVITY:  Activity as tolerated  OXYGEN:  Home Oxygen: No.   Oxygen Delivery: room air  DISCHARGE LOCATION:  home   If you experience worsening of your admission symptoms, develop shortness of breath, life threatening  emergency, suicidal or homicidal thoughts you must seek medical attention immediately by calling 911 or calling your MD immediately  if symptoms less severe.  You Must read complete instructions/literature along with all the possible adverse reactions/side effects for all the Medicines you take and that have been prescribed to you. Take any new Medicines after you have completely understood and accpet all the possible adverse reactions/side effects.   Please note  You were cared for by a hospitalist during your hospital stay. If you have any questions about your discharge medications or the care you received while you were in the hospital after you are discharged, you can call the unit and asked to speak with the hospitalist on call if the hospitalist that took care of you is not available. Once you are discharged, your primary care physician will handle any further medical issues. Please note that NO REFILLS for any discharge medications will be authorized once you are discharged, as it is imperative that you return to your primary care physician (or establish a relationship with a primary care physician if you do not have one) for your aftercare needs so that they can reassess your need for medications and monitor your lab values.    On the day of Discharge:   VITAL SIGNS:  Blood pressure 114/70, pulse 86, temperature 97.7 F (36.5 C), temperature source Oral, resp. rate 20, weight 76.522 kg (168 lb 11.2 oz), last menstrual period 06/24/2015, SpO2 98 %.  I/O:   Intake/Output Summary (Last 24  hours) at 07/07/15 0836 Last data filed at 07/07/15 0800  Gross per 24 hour  Intake      0 ml  Output    250 ml  Net   -250 ml    PHYSICAL EXAMINATION:  GENERAL:  45 y.o.-year-old patient lying in the bed with no acute distress.  EYES: Pupils equal, round, reactive to light and accommodation. No scleral icterus. Extraocular muscles intact.  HEENT: Head atraumatic, normocephalic. Oropharynx and  nasopharynx clear.  NECK:  Supple, no jugular venous distention. No thyroid enlargement, no tenderness.  LUNGS: Normal breath sounds bilaterally, no wheezing, rales,rhonchi or crepitation. No use of accessory muscles of respiration.  CARDIOVASCULAR: S1, S2 normal. No murmurs, rubs, or gallops.  ABDOMEN: Soft, non-tender, non-distended. Bowel sounds present. No organomegaly or mass.  EXTREMITIES: No pedal edema, cyanosis, or clubbing.  NEUROLOGIC: Cranial nerves II through XII are intact. Muscle strength 5/5 in all extremities. Sensation intact. Gait not checked.  PSYCHIATRIC: The patient is alert and oriented x 3.  SKIN: No obvious rash, lesion, or ulcer.   DATA REVIEW:   CBC  Recent Labs Lab 07/07/15 0613  WBC 5.7  HGB 12.5  HCT 36.3  PLT 201    Chemistries   Recent Labs Lab 07/07/15 0613  NA 136  K 3.5  CL 108  CO2 26  GLUCOSE 85  BUN 9  CREATININE 0.35*  CALCIUM 8.6*    Cardiac Enzymes  Recent Labs Lab 07/07/15 [id number]  TROPONINI <0.03    Microbiology Results  Results for orders placed or performed in visit on 10/06/12  CULTURE, URINE COMPREHENSIVE     Status: None   Collection Time: 10/06/12  3:56 PM  Result Value Ref Range Status   Colony Count NO GROWTH  Final   Organism ID, Bacteria NO GROWTH  Final    RADIOLOGY:  Dg Chest 2 View  07/06/2015  CLINICAL DATA:  Chest pain EXAM: CHEST  2 VIEW COMPARISON:  None. FINDINGS: The heart size and mediastinal contours are within normal limits. Both lungs are clear. Mild degenerative changes mid thoracic spine. IMPRESSION: No active cardiopulmonary disease. Electronically Signed   By: Lahoma Crocker M.D.   On: 07/06/2015 21:09     Management plans discussed with the patient, family and they are in agreement.  CODE STATUS:     Code Status Orders        Start     Ordered   07/07/15 0003  Full code   Continuous     07/07/15 0002    Code Status History    Date Active Date Inactive Code Status Order ID  Comments User Context   This patient has a current code status but no historical code status.      TOTAL TIME TAKING CARE OF THIS PATIENT: 28 minutes.    Hower,  Karenann Cai.D on 07/07/2015 at 8:36 AM  Between 7am to 6pm - Pager - [phone]  After 6pm go to [email or web address] - password [email or web address] [facility]  Office  [phone]  CC: Primary care physician; Rica Mast, MD

## 2015-07-13 DIAGNOSIS — E559 Vitamin D deficiency, unspecified: Secondary | ICD-10-CM | POA: Diagnosis not present

## 2015-07-13 DIAGNOSIS — G35 Multiple sclerosis: Secondary | ICD-10-CM | POA: Diagnosis not present

## 2015-07-17 ENCOUNTER — Telehealth: Payer: Self-pay | Admitting: Internal Medicine

## 2015-07-17 NOTE — Telephone Encounter (Signed)
error 

## 2015-07-18 ENCOUNTER — Encounter: Payer: Self-pay | Admitting: Family Medicine

## 2015-07-18 ENCOUNTER — Ambulatory Visit (INDEPENDENT_AMBULATORY_CARE_PROVIDER_SITE_OTHER): Payer: 59 | Admitting: Family Medicine

## 2015-07-18 VITALS — BP 116/80 | HR 97 | Temp 97.7°F | Ht 67.5 in | Wt 174.2 lb

## 2015-07-18 DIAGNOSIS — J0100 Acute maxillary sinusitis, unspecified: Secondary | ICD-10-CM | POA: Insufficient documentation

## 2015-07-18 MED ORDER — AMOXICILLIN-POT CLAVULANATE 875-125 MG PO TABS: 1.0000 | ORAL_TABLET | Freq: Two times a day (BID) | ORAL | Status: DC

## 2015-07-18 NOTE — Patient Instructions (Addendum)
Nice to meet you. You likely have a sinus infection causing your symptoms. We'll treat you with Augmentin. You should take a probiotic while on this. If you develop persistent chest pain, shortness of breath, fevers, cough productive of blood, or any new or changing symptoms please seek medical attention.

## 2015-07-18 NOTE — Progress Notes (Signed)
Patient ID: Maria Jennings, female   DOB: [birth year], 45 y.o.   MRN: [id number]  Tommi Rumps, MD Phone: [phone]  Maria Jennings is a 45 y.o. female who presents today for same-day visit.  Patient presents with onset of symptoms 8 days ago. Notes cough and congestion in her nasal passages and sinuses. She notes she had a low-grade fever last week with body aches though the fever and body aches resolved. Cough and sinus congestion has persisted. She notes yesterday her chest did hurt when coughing though had no other chest pain. It was a sharp discomfort. No exertional component. No history of hypertension, hyperlipidemia, or diabetes. Mild shortness of breath with cough though no other shortness of breath. She notes no chest discomfort or trouble breathing today. Notes these symptoms resolved with taking ibuprofen yesterday. She took Alka-Seltzer and ibuprofen yesterday.  PMH: nonsmoker.   ROS see history of present illness  Objective  Physical Exam Filed Vitals:   07/18/15 1401  BP: 116/80  Pulse: 97  Temp: 97.7 F (36.5 C)    BP Readings from Last 3 Encounters:  07/18/15 116/80  07/07/15 114/70  06/12/15 130/88   Wt Readings from Last 3 Encounters:  07/18/15 174 lb 3.2 oz (79.017 kg)  07/06/15 168 lb 11.2 oz (76.522 kg)  06/12/15 173 lb 2 oz (78.529 kg)    Physical Exam  Constitutional: She is well-developed, well-nourished, and in no distress.  HENT:  Head: Normocephalic and atraumatic.  Right Ear: External ear normal.  Left Ear: External ear normal.  Mouth/Throat: No oropharyngeal exudate.  Posterior oropharyngeal erythema noted, no tonsillar swelling, bilateral frontal sinuses tender to percussion  Eyes: Conjunctivae are normal. Pupils are equal, round, and reactive to light.  Neck: Neck supple.  Cardiovascular: Normal rate, regular rhythm and normal heart sounds.   Pulmonary/Chest: Effort normal and breath sounds normal. No respiratory distress. She has no  wheezes. She has no rales.  Lymphadenopathy:    She has no cervical adenopathy.  Neurological: She is alert. Gait normal.  Skin: Skin is warm and dry. She is not diaphoretic.   Ears irrigated revealing normal TMs. Patient reported improvement in her ear fullness with irrigation.  Assessment/Plan: Please see individual problem list.  Sinusitis, acute maxillary Patient's symptoms most consistent with sinusitis. Likely had a viral illness previously. We will treat with Augmentin. She will take a probiotic while on this. She'll continue to monitor. She's given return precautions.    No orders of the defined types were placed in this encounter.    Meds ordered this encounter  Medications  . amoxicillin-clavulanate (AUGMENTIN) 875-125 MG tablet    Sig: Take 1 tablet by mouth 2 (two) times daily.    Dispense:  14 tablet    Refill:  0    Tommi Rumps, MD [place]

## 2015-07-18 NOTE — Progress Notes (Signed)
Pre visit review using our clinic review tool, if applicable. No additional management support is needed unless otherwise documented below in the visit note. 

## 2015-07-18 NOTE — Assessment & Plan Note (Addendum)
Patient's symptoms most consistent with sinusitis. Likely had a viral illness previously. We will treat with Augmentin. She will take a probiotic while on this. She'll continue to monitor. She's given return precautions.

## 2015-07-19 ENCOUNTER — Encounter: Payer: Self-pay | Admitting: Family Medicine

## 2015-07-23 ENCOUNTER — Other Ambulatory Visit: Payer: Self-pay | Admitting: Neurology

## 2015-07-23 DIAGNOSIS — G35 Multiple sclerosis: Secondary | ICD-10-CM

## 2015-10-10 ENCOUNTER — Telehealth: Payer: Self-pay | Admitting: *Deleted

## 2015-10-10 NOTE — Telephone Encounter (Signed)
void

## 2015-10-15 ENCOUNTER — Other Ambulatory Visit: Payer: 59

## 2015-10-15 ENCOUNTER — Ambulatory Visit: Payer: 59

## 2015-10-23 ENCOUNTER — Telehealth: Payer: Self-pay | Admitting: Internal Medicine

## 2015-10-23 NOTE — Telephone Encounter (Signed)
Yes. Bernardo Heater message.  She will have to confirm

## 2015-10-23 NOTE — Telephone Encounter (Signed)
Pt called wanting to know if you spoke to Dr Derrel Nip about taking her as a NP?  Call pt @ [phone]. Thank you!

## 2015-10-24 NOTE — Telephone Encounter (Signed)
Ok. Thank you! Let me know!

## 2015-12-10 ENCOUNTER — Ambulatory Visit: Payer: 59 | Admitting: Internal Medicine

## 2015-12-12 ENCOUNTER — Ambulatory Visit (INDEPENDENT_AMBULATORY_CARE_PROVIDER_SITE_OTHER): Payer: 59 | Admitting: Family Medicine

## 2015-12-12 VITALS — BP 126/84 | HR 73 | Temp 97.7°F

## 2015-12-12 DIAGNOSIS — G35 Multiple sclerosis: Secondary | ICD-10-CM | POA: Diagnosis not present

## 2015-12-12 LAB — CBC WITH DIFFERENTIAL/PLATELET
Basophils Absolute: 0 10*3/uL (ref 0.0–0.1)
Basophils Relative: 0.5 % (ref 0.0–3.0)
EOS ABS: 0.2 10*3/uL (ref 0.0–0.7)
EOS PCT: 4.2 % (ref 0.0–5.0)
HCT: 38.4 % (ref 36.0–46.0)
Hemoglobin: 13 g/dL (ref 12.0–15.0)
LYMPHS ABS: 1.1 10*3/uL (ref 0.7–4.0)
Lymphocytes Relative: 20.3 % (ref 12.0–46.0)
MCHC: 33.7 g/dL (ref 30.0–36.0)
MCV: 88 fl (ref 78.0–100.0)
MONO ABS: 0.4 10*3/uL (ref 0.1–1.0)
Monocytes Relative: 6.8 % (ref 3.0–12.0)
NEUTROS PCT: 68.2 % (ref 43.0–77.0)
Neutro Abs: 3.6 10*3/uL (ref 1.4–7.7)
Platelets: 221 10*3/uL (ref 150.0–400.0)
RBC: 4.37 Mil/uL (ref 3.87–5.11)
RDW: 14.2 % (ref 11.5–15.5)
WBC: 5.2 10*3/uL (ref 4.0–10.5)

## 2015-12-12 LAB — COMPREHENSIVE METABOLIC PANEL
ALK PHOS: 52 U/L (ref 39–117)
ALT: 10 U/L (ref 0–35)
AST: 12 U/L (ref 0–37)
Albumin: 4.3 g/dL (ref 3.5–5.2)
BUN: 15 mg/dL (ref 6–23)
CHLORIDE: 103 meq/L (ref 96–112)
CO2: 29 mEq/L (ref 19–32)
Calcium: 9.1 mg/dL (ref 8.4–10.5)
Creatinine, Ser: 0.61 mg/dL (ref 0.40–1.20)
GFR: 111.47 mL/min (ref >60.00)
GLUCOSE: 87 mg/dL (ref 70–99)
POTASSIUM: 4 meq/L (ref 3.5–5.1)
SODIUM: 137 meq/L (ref 135–145)
TOTAL PROTEIN: 6.9 g/dL (ref 6.0–8.3)
Total Bilirubin: 0.6 mg/dL (ref 0.2–1.2)

## 2015-12-12 LAB — VITAMIN D 25 HYDROXY (VIT D DEFICIENCY, FRACTURES): VITD: 26.1 ng/mL — AB (ref 30.00–100.00)

## 2015-12-12 LAB — VITAMIN B12: VITAMIN B 12: 485 pg/mL (ref 211–911)

## 2015-12-12 NOTE — Assessment & Plan Note (Signed)
This issue is stable. She is currently on tecfidera for neurology. Neurologically intact. She is due for lab work today. This is ordered. We'll fax to her neurologist.

## 2015-12-12 NOTE — Patient Instructions (Signed)
Nice to see you. We will get your lab work. Once this returns we will give you a call. If you develop any blurry vision, numbness, weakness, or any new or changing symptoms please seek medical attention.

## 2015-12-12 NOTE — Progress Notes (Signed)
  Tommi Rumps, MD Phone: [phone]  Maria Jennings is a 45 y.o. female who presents today for follow-up.  MS: Patient notes this is stable. No flares recently. Diagnosed a couple years ago when one of her eyes became blurry. Notes that cleared January of last year. No numbness, weakness, or vision changes. Is followed by neurology at [facility]. Is due for lab work today.  ROS see history of present illness  Objective  Physical Exam Vitals:   12/12/15 1436  BP: 126/84  Pulse: 73  Temp: 97.7 F (36.5 C)    BP Readings from Last 3 Encounters:  12/12/15 126/84  07/18/15 116/80  07/07/15 114/70   Wt Readings from Last 3 Encounters:  07/18/15 174 lb 3.2 oz (79 kg)  07/06/15 168 lb 11.2 oz (76.5 kg)  06/12/15 173 lb 2 oz (78.5 kg)    Physical Exam  Constitutional: No distress.  HENT:  Mouth/Throat: Oropharynx is clear and moist. No oropharyngeal exudate.  Eyes: Conjunctivae are normal. Pupils are equal, round, and reactive to light.  Cardiovascular: Normal rate, regular rhythm and normal heart sounds.   Pulmonary/Chest: Effort normal and breath sounds normal.  Neurological: She is alert.  CN 2-12 intact, 5/5 strength in bilateral biceps, triceps, grip, quads, hamstrings, plantar and dorsiflexion, sensation to light touch intact in bilateral UE and LE, normal gait, 2+ patellar reflexes  Skin: Skin is warm and dry. She is not diaphoretic.     Assessment/Plan: Please see individual problem list.  Multiple sclerosis ([place]) This issue is stable. She is currently on tecfidera for neurology. Neurologically intact. She is due for lab work today. This is ordered. We'll fax to her neurologist.   Orders Placed This Encounter  Procedures  . Vitamin D (25 hydroxy)  . B12  . CBC w/Diff  . Comp Met (CMET)    No orders of the defined types were placed in this encounter.   Tommi Rumps, MD [place]

## 2015-12-25 ENCOUNTER — Encounter: Payer: Self-pay | Admitting: Family Medicine

## 2015-12-27 ENCOUNTER — Other Ambulatory Visit: Payer: Self-pay | Admitting: Family Medicine

## 2015-12-27 MED ORDER — VITAMIN D (ERGOCALCIFEROL) 1.25 MG (50000 UNIT) PO CAPS: 50000.0000 [IU] | ORAL_CAPSULE | ORAL | 1 refills | Status: DC

## 2016-02-04 ENCOUNTER — Other Ambulatory Visit: Payer: Self-pay | Admitting: Family Medicine

## 2016-02-04 DIAGNOSIS — Z1231 Encounter for screening mammogram for malignant neoplasm of breast: Secondary | ICD-10-CM

## 2016-02-19 ENCOUNTER — Ambulatory Visit
Admission: RE | Admit: 2016-02-19 | Discharge: 2016-02-19 | Disposition: A | Discharge status: Home / Self Care | Payer: 59 | Source: Ambulatory Visit | Attending: Family Medicine | Admitting: Family Medicine

## 2016-02-19 DIAGNOSIS — Z1231 Encounter for screening mammogram for malignant neoplasm of breast: Secondary | ICD-10-CM

## 2016-06-04 ENCOUNTER — Encounter: Payer: Self-pay | Admitting: Nurse Practitioner

## 2016-06-04 NOTE — Progress Notes (Signed)
Patient ID: Maria Jennings, female   DOB: 09/02/1970, 46 y.o.   MRN: [id number]  46 y.o. G61P2002 Married  Caucasian Fe here for annual exam.  Now on RX Vit D and followed PCP in 2 weeks.  Now menses for 4 days.  Heavy then light.  Will be having MRI this year to follow up on MRI.  Patient's last menstrual period was 05/24/2016.          Sexually active: Yes.    The current method of family planning is coitus interruptus.    Exercising: Yes.    running, bike, tennis, hike Smoker:  no  Health Maintenance: Pap: 06/04/15, Negative with neg HR HPV  05/10/12, WNL, neg HR HPV MMG: 02/19/16, 3D, Bi-Rads 1:  Negative TDaP: 08/16/08 HIV: 2003 with pregnancy Labs: PCP   reports that she has never smoked. She has never used smokeless tobacco. She reports that she drinks about 1.8 - 2.4 oz of alcohol per week . She reports that she does not use drugs.  Past Medical History:  Diagnosis Date  . Lumbago   . Multiple sclerosis ([place])   . Sinusitis   . Vitamin D deficiency     Past Surgical History:  Procedure Laterality Date  . CESAREAN SECTION     x 1  . KNEE ARTHROSCOPY Right 07/2013    Current Outpatient Prescriptions  Medication Sig Dispense Refill  . cyanocobalamin 1000 MCG tablet Take 1,000 mcg by mouth 2 (two) times a week.    . Dimethyl Fumarate 240 MG CPDR Take 120 mg by mouth 2 (two) times daily.     . fexofenadine-pseudoephedrine (ALLEGRA-D 24) 180-240 MG 24 hr tablet Take by mouth as needed.    . Vitamin D, Ergocalciferol, (DRISDOL) 50000 units CAPS capsule Take 1 capsule (50,000 Units total) by mouth every 7 (seven) days. 8 capsule 1   No current facility-administered medications for this visit.     Family History  Problem Relation Age of Onset  . Hypertension Mother   . Hyperlipidemia Mother   . Hypertension Father   . Cancer Maternal Grandmother 61    colon  . Cancer Cousin 52    colon  . Breast cancer Neg Hx     ROS:  Pertinent items are noted in HPI.  Otherwise, a  comprehensive ROS was negative.  Exam:   BP 120/80 (BP Location: Right Arm, Patient Position: Sitting, Cuff Size: Normal)   Pulse 76   Resp 18   Ht 5' 7.75" (1.721 m)   Wt 184 lb (83.5 kg)   LMP 05/24/2016   BMI 28.18 kg/m  Height: 5' 7.75" (172.1 cm) Ht Readings from Last 3 Encounters:  06/06/16 5' 7.75" (1.721 m)  07/18/15 5' 7.5" (1.715 m)  06/12/15 5' 7.5" (1.715 m)    General appearance: alert, cooperative and appears stated age Head: Normocephalic, without obvious abnormality, atraumatic Neck: no adenopathy, supple, symmetrical, trachea midline and thyroid  Lungs: clear to auscultation bilaterally Breasts: normal appearance, no masses or tenderness Heart: regular rate and rhythm Abdomen: soft, non-tender; no masses,  no organomegaly Extremities: extremities normal, atraumatic, no cyanosis or edema Skin: Skin color, texture, turgor normal. No rashes or lesions Lymph nodes: Cervical, supraclavicular, and axillary nodes normal. No abnormal inguinal nodes palpated Neurologic: Grossly normal   Pelvic: External genitalia:  no lesions              Urethra:  normal appearing urethra with no masses, tenderness or lesions  Bartholin's and Skene's: normal                 Vagina: normal appearing vagina with normal color and discharge, no lesions              Cervix: anteverted              Pap taken: No. Bimanual Exam:  Uterus:  normal size, contour, position, consistency, mobility, non-tender              Adnexa: no mass, fullness, tenderness               Rectovaginal: Confirms               Anus:  normal sphincter tone, no lesions  Chaperone present: yes  A:  Well Woman with normal exam  Normal menses Withdrawal for birth control History of Vit D deficiency - now followed by PCP New Diagnosis of MS 10/2013  P:   Reviewed health and wellness pertinent to exam  Pap smear not done  Mammogram is due 02/2017  Counseled on  breast self exam, mammography screening, adequate intake of calcium and vitamin D, diet and exercise, Kegel's exercises return annually or prn  An After Visit Summary was printed and given to the patient.

## 2016-06-06 ENCOUNTER — Ambulatory Visit (INDEPENDENT_AMBULATORY_CARE_PROVIDER_SITE_OTHER): Payer: 59 | Admitting: Nurse Practitioner

## 2016-06-06 ENCOUNTER — Encounter: Payer: Self-pay | Admitting: Nurse Practitioner

## 2016-06-06 VITALS — BP 120/80 | HR 76 | Resp 18 | Ht 67.75 in | Wt 184.0 lb

## 2016-06-06 DIAGNOSIS — G35 Multiple sclerosis: Secondary | ICD-10-CM | POA: Diagnosis not present

## 2016-06-06 DIAGNOSIS — Z01411 Encounter for gynecological examination (general) (routine) with abnormal findings: Secondary | ICD-10-CM | POA: Diagnosis not present

## 2016-06-06 DIAGNOSIS — Z0000 Encounter for general adult medical examination without abnormal findings: Secondary | ICD-10-CM | POA: Diagnosis not present

## 2016-06-06 NOTE — Patient Instructions (Signed)

## 2016-06-08 NOTE — Progress Notes (Signed)
Encounter reviewed by Dr. Farhiya Rosten Amundson C. Silva.  

## 2016-06-11 ENCOUNTER — Encounter: Payer: Self-pay | Admitting: Family Medicine

## 2016-06-11 ENCOUNTER — Ambulatory Visit (INDEPENDENT_AMBULATORY_CARE_PROVIDER_SITE_OTHER): Payer: 59 | Admitting: Family Medicine

## 2016-06-11 VITALS — BP 124/90 | HR 70 | Temp 98.3°F | Wt 185.8 lb

## 2016-06-11 DIAGNOSIS — G35 Multiple sclerosis: Secondary | ICD-10-CM | POA: Diagnosis not present

## 2016-06-11 DIAGNOSIS — E559 Vitamin D deficiency, unspecified: Secondary | ICD-10-CM

## 2016-06-11 LAB — CBC WITH DIFFERENTIAL/PLATELET
BASOS ABS: 0.1 10*3/uL (ref 0.0–0.1)
Basophils Relative: 1 % (ref 0.0–3.0)
EOS ABS: 0.3 10*3/uL (ref 0.0–0.7)
Eosinophils Relative: 4.9 % (ref 0.0–5.0)
HCT: 38.4 % (ref 36.0–46.0)
HEMOGLOBIN: 12.9 g/dL (ref 12.0–15.0)
LYMPHS PCT: 18.4 % (ref 12.0–46.0)
Lymphs Abs: 1.2 10*3/uL (ref 0.7–4.0)
MCHC: 33.5 g/dL (ref 30.0–36.0)
MCV: 88.7 fl (ref 78.0–100.0)
MONOS PCT: 7 % (ref 3.0–12.0)
Monocytes Absolute: 0.5 10*3/uL (ref 0.1–1.0)
NEUTROS ABS: 4.4 10*3/uL (ref 1.4–7.7)
Neutrophils Relative %: 68.7 % (ref 43.0–77.0)
PLATELETS: 222 10*3/uL (ref 150.0–400.0)
RBC: 4.33 Mil/uL (ref 3.87–5.11)
RDW: 14.5 % (ref 11.5–15.5)
WBC: 6.4 10*3/uL (ref 4.0–10.5)

## 2016-06-11 LAB — COMPREHENSIVE METABOLIC PANEL
ALBUMIN: 4.4 g/dL (ref 3.5–5.2)
ALT: 10 U/L (ref 0–35)
AST: 13 U/L (ref 0–37)
Alkaline Phosphatase: 58 U/L (ref 39–117)
BILIRUBIN TOTAL: 0.6 mg/dL (ref 0.2–1.2)
BUN: 13 mg/dL (ref 6–23)
CALCIUM: 9.5 mg/dL (ref 8.4–10.5)
CHLORIDE: 104 meq/L (ref 96–112)
CO2: 27 meq/L (ref 19–32)
Creatinine, Ser: 0.58 mg/dL (ref 0.40–1.20)
GFR: 117.9 mL/min (ref >60.00)
Glucose, Bld: 91 mg/dL (ref 70–99)
Potassium: 4.5 mEq/L (ref 3.5–5.1)
Sodium: 136 mEq/L (ref 135–145)
Total Protein: 7.1 g/dL (ref 6.0–8.3)

## 2016-06-11 LAB — VITAMIN D 25 HYDROXY (VIT D DEFICIENCY, FRACTURES): VITD: 19.78 ng/mL — ABNORMAL LOW (ref 30.00–100.00)

## 2016-06-11 LAB — VITAMIN B12: VITAMIN B 12: 454 pg/mL (ref 211–911)

## 2016-06-11 NOTE — Assessment & Plan Note (Signed)
Recheck vit d.

## 2016-06-11 NOTE — Assessment & Plan Note (Signed)
Well controlled. Currently being managed by neurology. Lab work through [place]. Labs ordered. We'll fax them to her neurologist.

## 2016-06-11 NOTE — Progress Notes (Signed)
  Tommi Rumps, MD Phone: [phone]  Maria Jennings is a 46 y.o. female who presents today for follow-up.  Multiple sclerosis: She is currently on tecfidera. Has not had any recent flares. No numbness or weakness. No vision changes. She saw her neurologist recently and they're planning on repeating an MRI brain and cervical spine to evaluate for any new lesions.  She has been noted to be vitamin D deficient periodically. She was treated with prescription strength vitamin D and then went back to 5000 units twice weekly. She is due for recheck of this.  ROS see history of present illness  Objective  Physical Exam Vitals:   06/11/16 1426  BP: 124/90  Pulse: 70  Temp: 98.3 F (36.8 C)    BP Readings from Last 3 Encounters:  06/11/16 124/90  06/06/16 120/80  12/12/15 126/84   Wt Readings from Last 3 Encounters:  06/11/16 185 lb 12.8 oz (84.3 kg)  06/06/16 184 lb (83.5 kg)  07/18/15 174 lb 3.2 oz (79 kg)    Physical Exam  Constitutional: She is well-developed, well-nourished, and in no distress.  Cardiovascular: Normal rate, regular rhythm and normal heart sounds.   Pulmonary/Chest: Effort normal and breath sounds normal.  Neurological: She is alert.  CN 2-12 intact, 5/5 strength in bilateral biceps, triceps, grip, quads, hamstrings, plantar and dorsiflexion, sensation to light touch intact in bilateral UE and LE  Skin: Skin is warm and dry.     Assessment/Plan: Please see individual problem list.  Multiple sclerosis ([place]) Well controlled. Currently being managed by neurology. Lab work through [place]. Labs ordered. We'll fax them to her neurologist.  Vitamin D deficiency Recheck vit d.   Orders Placed This Encounter  Procedures  . B12  . Vitamin D (25 hydroxy)  . Comp Met (CMET)  . CBC w/Diff    Tommi Rumps, MD [place]

## 2016-06-11 NOTE — Patient Instructions (Signed)
Nice to see you. We will obtain your lab work today. We will fax this to your neurologist.

## 2016-06-11 NOTE — Progress Notes (Signed)
Pre visit review using our clinic review tool, if applicable. No additional management support is needed unless otherwise documented below in the visit note. 

## 2016-06-27 ENCOUNTER — Ambulatory Visit
Admission: RE | Admit: 2016-06-27 | Discharge: 2016-06-27 | Disposition: A | Discharge status: Home / Self Care | Payer: 59 | Source: Ambulatory Visit | Attending: Neurology | Admitting: Neurology

## 2016-06-27 DIAGNOSIS — G35 Multiple sclerosis: Secondary | ICD-10-CM | POA: Diagnosis not present

## 2016-06-27 DIAGNOSIS — M47812 Spondylosis without myelopathy or radiculopathy, cervical region: Secondary | ICD-10-CM | POA: Diagnosis not present

## 2016-06-27 DIAGNOSIS — M4802 Spinal stenosis, cervical region: Secondary | ICD-10-CM | POA: Diagnosis not present

## 2016-06-27 DIAGNOSIS — M50322 Other cervical disc degeneration at C5-C6 level: Secondary | ICD-10-CM | POA: Diagnosis not present

## 2016-06-27 DIAGNOSIS — G939 Disorder of brain, unspecified: Secondary | ICD-10-CM | POA: Diagnosis not present

## 2016-06-27 MED ORDER — GADOBENATE DIMEGLUMINE 529 MG/ML IV SOLN
20.0000 mL | Freq: Once | INTRAVENOUS | Status: AC | PRN
  Administered 2016-06-27: 20 mL via INTRAVENOUS

## 2016-07-23 DIAGNOSIS — E538 Deficiency of other specified B group vitamins: Secondary | ICD-10-CM | POA: Diagnosis not present

## 2016-07-23 DIAGNOSIS — G35 Multiple sclerosis: Secondary | ICD-10-CM | POA: Diagnosis not present

## 2016-07-23 DIAGNOSIS — E559 Vitamin D deficiency, unspecified: Secondary | ICD-10-CM | POA: Diagnosis not present

## 2016-11-10 ENCOUNTER — Telehealth: Payer: Self-pay | Admitting: Obstetrics & Gynecology

## 2016-11-10 NOTE — Telephone Encounter (Signed)
Left message on voicemail to call and reschedule cancelled appointment. Mail letter °

## 2016-12-09 ENCOUNTER — Ambulatory Visit: Payer: 59 | Admitting: Family Medicine

## 2017-01-12 ENCOUNTER — Telehealth: Payer: Self-pay | Admitting: Family Medicine

## 2017-01-12 DIAGNOSIS — G35 Multiple sclerosis: Secondary | ICD-10-CM

## 2017-01-12 NOTE — Telephone Encounter (Signed)
Please schedule lab appmt, labs in

## 2017-01-12 NOTE — Telephone Encounter (Signed)
PT called requesting to just have lab work done. Pt states that usually they check her WBC, Vitamin B and D. Please advise, thank you!  Call pt @ [phone] (work number)

## 2017-01-12 NOTE — Telephone Encounter (Signed)
Please advise 

## 2017-01-12 NOTE — Telephone Encounter (Signed)
Orders placed.

## 2017-02-05 ENCOUNTER — Other Ambulatory Visit (INDEPENDENT_AMBULATORY_CARE_PROVIDER_SITE_OTHER): Payer: 59

## 2017-02-05 DIAGNOSIS — G35 Multiple sclerosis: Secondary | ICD-10-CM | POA: Diagnosis not present

## 2017-02-06 LAB — COMPREHENSIVE METABOLIC PANEL
ALBUMIN: 4.4 g/dL (ref 3.5–5.2)
ALK PHOS: 62 U/L (ref 39–117)
ALT: 15 U/L (ref 0–35)
AST: 15 U/L (ref 0–37)
BUN: 12 mg/dL (ref 6–23)
CALCIUM: 9.5 mg/dL (ref 8.4–10.5)
CO2: 28 mEq/L (ref 19–32)
CREATININE: 0.57 mg/dL (ref 0.40–1.20)
Chloride: 101 mEq/L (ref 96–112)
GFR: 119.96 mL/min (ref >60.00)
Glucose, Bld: 89 mg/dL (ref 70–99)
POTASSIUM: 3.9 meq/L (ref 3.5–5.1)
Sodium: 137 mEq/L (ref 135–145)
TOTAL PROTEIN: 6.9 g/dL (ref 6.0–8.3)
Total Bilirubin: 0.6 mg/dL (ref 0.2–1.2)

## 2017-02-06 LAB — CBC WITH DIFFERENTIAL/PLATELET
Basophils Absolute: 0.1 10*3/uL (ref 0.0–0.1)
Basophils Relative: 0.7 % (ref 0.0–3.0)
EOS ABS: 0.3 10*3/uL (ref 0.0–0.7)
Eosinophils Relative: 3.8 % (ref 0.0–5.0)
HCT: 40.3 % (ref 36.0–46.0)
HEMOGLOBIN: 13 g/dL (ref 12.0–15.0)
Lymphocytes Relative: 15.6 % (ref 12.0–46.0)
Lymphs Abs: 1.3 10*3/uL (ref 0.7–4.0)
MCHC: 32.3 g/dL (ref 30.0–36.0)
MCV: 90.7 fl (ref 78.0–100.0)
MONO ABS: 0.4 10*3/uL (ref 0.1–1.0)
Monocytes Relative: 4.9 % (ref 3.0–12.0)
Neutro Abs: 6.4 10*3/uL (ref 1.4–7.7)
Neutrophils Relative %: 75 % (ref 43.0–77.0)
Platelets: 246 10*3/uL (ref 150.0–400.0)
RBC: 4.44 Mil/uL (ref 3.87–5.11)
RDW: 14.2 % (ref 11.5–15.5)
WBC: 8.5 10*3/uL (ref 4.0–10.5)

## 2017-02-06 LAB — VITAMIN D 25 HYDROXY (VIT D DEFICIENCY, FRACTURES): VITD: 34.65 ng/mL (ref 30.00–100.00)

## 2017-02-06 LAB — VITAMIN B12: VITAMIN B 12: 621 pg/mL (ref 211–911)

## 2017-02-09 ENCOUNTER — Encounter: Payer: Self-pay | Admitting: *Deleted

## 2017-02-09 DIAGNOSIS — D2339 Other benign neoplasm of skin of other parts of face: Secondary | ICD-10-CM | POA: Diagnosis not present

## 2017-03-19 ENCOUNTER — Ambulatory Visit (INDEPENDENT_AMBULATORY_CARE_PROVIDER_SITE_OTHER): Payer: 59 | Admitting: Internal Medicine

## 2017-03-19 ENCOUNTER — Encounter: Payer: Self-pay | Admitting: Internal Medicine

## 2017-03-19 VITALS — BP 120/84 | HR 64 | Temp 97.9°F | Wt 186.0 lb

## 2017-03-19 DIAGNOSIS — H6123 Impacted cerumen, bilateral: Secondary | ICD-10-CM

## 2017-03-19 NOTE — Progress Notes (Signed)
Subjective:    Patient ID: Maria Jennings, female    DOB: 06/23/1970, 46 y.o.   MRN: [id number]  HPI  Pt presents to the clinic today with c/o left ear fullness. This started 2 days ago. She denies pain but has had some decreased hearing. She has not tried anything OTC for her symptoms.  Review of Systems      Past Medical History:  Diagnosis Date  . Lumbago   . Multiple sclerosis ([place])   . Sinusitis   . Vitamin D deficiency     Current Outpatient Medications  Medication Sig Dispense Refill  . Cholecalciferol (VITAMIN D3) 5000 units CAPS Take 1 capsule by mouth 2 (two) times a week.    . cyanocobalamin 1000 MCG tablet Take 1,000 mcg by mouth 2 (two) times a week.    . Dimethyl Fumarate 240 MG CPDR Take 120 mg by mouth 2 (two) times daily.     . fexofenadine-pseudoephedrine (ALLEGRA-D 24) 180-240 MG 24 hr tablet Take by mouth as needed.     No current facility-administered medications for this visit.     Allergies  Allergen Reactions  . Amoxicillin Other (See Comments)  . Azithromycin Hives  . Ciprofloxacin Hcl Rash    Family History  Problem Relation Age of Onset  . Hypertension Mother   . Hyperlipidemia Mother   . Hypertension Father   . Cancer Maternal Grandmother 1       colon  . Cancer Cousin 52       colon  . Breast cancer Neg Hx     Social History   Socioeconomic History  . Marital status: Married    Spouse name: Not on file  . Number of children: 2  . Years of education: Not on file  . Highest education level: Not on file  Social Needs  . Financial resource strain: Not on file  . Food insecurity - worry: Not on file  . Food insecurity - inability: Not on file  . Transportation needs - medical: Not on file  . Transportation needs - non-medical: Not on file  Occupational History    Employer: [employer]  Tobacco Use  . Smoking status: Never Smoker  . Smokeless tobacco: Never Used  Substance and Sexual Activity  . Alcohol use: Yes    Alcohol/week:  1.8 - 2.4 oz    Types: 3 - 4 Glasses of wine per week    Comment: occasional  . Drug use: No  . Sexual activity: Yes    Partners: Male    Birth control/protection: Coitus interruptus  Other Topics Concern  . Not on file  Social History Narrative   Lives in [place].      Work - Psychologist, forensic     Constitutional: Denies fever, malaise, fatigue, headache or abrupt weight changes.  HEENT: Pt reports ear fullness. Denies eye pain, eye redness, ear pain, ringing in the ears, wax buildup, runny nose, nasal congestion, bloody nose, or sore throat. Respiratory: Denies difficulty breathing, shortness of breath, cough or sputum production.     No other specific complaints in a complete review of systems (except as listed in HPI above).  Objective:   Physical Exam   BP 120/84   Pulse 64   Temp 97.9 F (36.6 C) (Oral)   Wt 186 lb (84.4 kg)   SpO2 98%   BMI 28.49 kg/m  Wt Readings from Last 3 Encounters:  03/19/17 186 lb (84.4 kg)  06/11/16 185 lb 12.8 oz (84.3 kg)  06/06/16 184 lb (83.5 kg)    General: Appears her stated age, well developed, well nourished in NAD. Skin: Warm, dry and intact. No rashes, lesions or ulcerations noted. HEENT: Ears: Bilateral cerumen impaction.  BMET    Component Value Date/Time   NA 137 02/05/2017 1553   NA 136 07/04/2013 1543   K 3.9 02/05/2017 1553   K 3.6 07/04/2013 1543   CL 101 02/05/2017 1553   CL 104 07/04/2013 1543   CO2 28 02/05/2017 1553   CO2 29 07/04/2013 1543   GLUCOSE 89 02/05/2017 1553   GLUCOSE 105 (H) 12/15/2013 0752   BUN 12 02/05/2017 1553   BUN 11 07/04/2013 1543   CREATININE 0.57 02/05/2017 1553   CREATININE 0.57 (L) 07/04/2013 1543   CALCIUM 9.5 02/05/2017 1553   CALCIUM 8.8 07/04/2013 1543   GFRNONAA >60 07/07/2015 0613   GFRNONAA >60 07/04/2013 1543   GFRAA >60 07/07/2015 0613   GFRAA >60 07/04/2013 1543    Lipid Panel     Component Value Date/Time   CHOL 173 05/09/2015 1549   TRIG 114.0 05/09/2015 1549    HDL 62.50 05/09/2015 1549   CHOLHDL 3 05/09/2015 1549   VLDL 22.8 05/09/2015 1549   LDLCALC 88 05/09/2015 1549    CBC    Component Value Date/Time   WBC 8.5 02/05/2017 1553   RBC 4.44 02/05/2017 1553   HGB 13.0 02/05/2017 1553   HGB 14.7 12/15/2013 0752   HCT 40.3 02/05/2017 1553   HCT 44.0 12/15/2013 0752   PLT 246.0 02/05/2017 1553   PLT 223 12/15/2013 0752   MCV 90.7 02/05/2017 1553   MCV 90 12/15/2013 0752   MCH 30.6 07/07/2015 0613   MCHC 32.3 02/05/2017 1553   RDW 14.2 02/05/2017 1553   RDW 15.8 (H) 12/15/2013 0752   LYMPHSABS 1.3 02/05/2017 1553   LYMPHSABS 1.2 12/15/2013 0752   MONOABS 0.4 02/05/2017 1553   MONOABS 0.5 12/15/2013 0752   EOSABS 0.3 02/05/2017 1553   EOSABS 0.2 12/15/2013 0752   BASOSABS 0.1 02/05/2017 1553   BASOSABS 0.0 12/15/2013 0752    Hgb A1C No results found for: HGBA1C         Assessment & Plan:   Bilateral Cerumen Impaction:  Manual lavage by CMA  Advised her to try Debrox 2 x week to prevent wax buildup  Return precautions discussed Webb Silversmith, NP

## 2017-03-19 NOTE — Patient Instructions (Signed)
Place cerumen impaction patient instructions here.

## 2017-04-09 DIAGNOSIS — H5213 Myopia, bilateral: Secondary | ICD-10-CM | POA: Diagnosis not present

## 2017-04-09 DIAGNOSIS — H469 Unspecified optic neuritis: Secondary | ICD-10-CM | POA: Diagnosis not present

## 2017-04-09 DIAGNOSIS — H52223 Regular astigmatism, bilateral: Secondary | ICD-10-CM | POA: Diagnosis not present

## 2017-06-08 ENCOUNTER — Ambulatory Visit: Payer: 59 | Admitting: Nurse Practitioner

## 2017-06-10 ENCOUNTER — Ambulatory Visit: Payer: 59 | Admitting: Certified Nurse Midwife

## 2017-06-16 ENCOUNTER — Ambulatory Visit: Payer: 59 | Admitting: Certified Nurse Midwife

## 2017-07-07 ENCOUNTER — Other Ambulatory Visit: Payer: Self-pay

## 2017-07-07 ENCOUNTER — Encounter: Payer: Self-pay | Admitting: Certified Nurse Midwife

## 2017-07-07 ENCOUNTER — Ambulatory Visit (INDEPENDENT_AMBULATORY_CARE_PROVIDER_SITE_OTHER): Payer: 59 | Admitting: Certified Nurse Midwife

## 2017-07-07 VITALS — BP 110/70 | HR 72 | Resp 16 | Ht 68.5 in | Wt 184.0 lb

## 2017-07-07 DIAGNOSIS — G35 Multiple sclerosis: Secondary | ICD-10-CM

## 2017-07-07 DIAGNOSIS — Z01419 Encounter for gynecological examination (general) (routine) without abnormal findings: Secondary | ICD-10-CM | POA: Diagnosis not present

## 2017-07-07 DIAGNOSIS — Z8669 Personal history of other diseases of the nervous system and sense organs: Secondary | ICD-10-CM

## 2017-07-07 DIAGNOSIS — Z1211 Encounter for screening for malignant neoplasm of colon: Secondary | ICD-10-CM

## 2017-07-07 NOTE — Progress Notes (Signed)
47 y.o. G71P2002 Married  Caucasian Fe here for annual exam.  Periods normal, no issues. Heavy days 1-2 and done by day 3-4 days. No cramping issues. Sees [facility] for aex, labs. Sees [facility]. Brigitte Pulse at [facility] for [place] all stable at present. Had MRI last year all stable, no changes. No urinary issues. Staying very active with exercise and daughters activities. 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 No health issues today.  Patient's last menstrual period was 06/23/2017 (exact date).          Sexually active: Yes.    The current method of family planning is withdrawal.    Exercising: Yes.    run, bike, tennis & yoga Smoker:  no  Health Maintenance: Pap:  06-04-15 neg HPV HR neg History of Abnormal Pap: no MMG:  02-19-16 category c density birads 1:neg, plans to schedule soon, declines help with Self Breast exams: yes Colonoscopy:  None IFOB dispensed today BMD:   none TDaP:  2010 Shingles: no Pneumonia: no Hep C and HIV: not done Labs: PCP [place] Flu: vaccine no   reports that she has never smoked. She has never used smokeless tobacco. She reports that she drinks about 3.0 oz of alcohol per week. She reports that she does not use drugs.  Past Medical History:  Diagnosis Date  . Lumbago   . Multiple sclerosis ([place])   . Sinusitis   . Vitamin D deficiency     Past Surgical History:  Procedure Laterality Date  . CESAREAN SECTION     x 1  . KNEE ARTHROSCOPY Right 07/2013    Current Outpatient Medications  Medication Sig Dispense Refill  . Cholecalciferol (VITAMIN D3) 5000 units CAPS Take 1 capsule by mouth 2 (two) times a week.    . cyanocobalamin 1000 MCG tablet Take 1,000 mcg by mouth 2 (two) times a week.    . Dimethyl Fumarate 240 MG CPDR Take 120 mg by mouth 2 (two) times daily.     . fexofenadine-pseudoephedrine (ALLEGRA-D 24) 180-240 MG 24 hr tablet Take by mouth as needed.     No current facility-administered medications for this visit.     Family History  Problem Relation Age of Onset   . Hypertension Mother   . Hyperlipidemia Mother   . Hypertension Father   . Cancer Maternal Grandmother 67       colon  . Cancer Cousin 52       colon  . Breast cancer Neg Hx     ROS:  Pertinent items are noted in HPI.  Otherwise, a comprehensive ROS was negative.  Exam:   BP 110/70   Pulse 72   Resp 16   Ht 5' 8.5" (1.74 m)   Wt 184 lb (83.5 kg)   LMP 06/23/2017 (Exact Date)   BMI 27.57 kg/m  Height: 5' 8.5" (174 cm) Ht Readings from Last 3 Encounters:  07/07/17 5' 8.5" (1.74 m)  06/06/16 5' 7.75" (1.721 m)  07/18/15 5' 7.5" (1.715 m)    General appearance: alert, cooperative and appears stated age Head: Normocephalic, without obvious abnormality, atraumatic Neck: no adenopathy, supple, symmetrical, trachea midline and thyroid normal to inspection and palpation Lungs: clear to auscultation bilaterally Breasts: normal appearance, no masses or tenderness, No nipple retraction or dimpling, No nipple discharge or bleeding, No axillary or supraclavicular adenopathy Heart: regular rate and rhythm Abdomen: soft, non-tender; no masses,  no organomegaly Extremities: extremities normal, atraumatic, no cyanosis or edema Skin: Skin color, texture, turgor normal. No rashes or lesions  Lymph nodes: Cervical, supraclavicular, and axillary nodes normal. No abnormal inguinal nodes palpated Neurologic: Grossly normal   Pelvic: External genitalia:  no lesions              Urethra:  normal appearing urethra with no masses, tenderness or lesions              Bartholin's and Skene's: normal                 Vagina: normal appearing vagina with normal color and discharge, no lesions              Cervix: no cervical motion tenderness and no lesions              Pap taken: No. Bimanual Exam:  Uterus:  normal size, contour, position, consistency, mobility, non-tender and anteverted              Adnexa: normal adnexa and no mass, fullness, tenderness               Rectovaginal: Confirms                Anus:  normal sphincter tone, no lesions  Chaperone present:   A:  Well Woman with normal exam  Periods normal  Contraception withdrawal  MS no changes, MD follow up yearly at [facility]  Vitamin D deficiency with PCP management  Family history of colon cancer distant cousin  Mammogram overdue, patient will schedule  P:   Reviewed health and wellness pertinent to exam  Discussed period changes can occur at this time and importance of keeping menses calendar and reporting changes  Continue follow up with MD's as indicated  Discussed risks/benefits of colonoscopy, patient is not ready to do at this point. Will do IFOB and return. Instructions given  Stressed  Importance of SBE and mammogram yearly  Pap smear: no   counseled on breast self exam, mammography screening, adequate intake of calcium and vitamin D, diet and exercise  return annually or prn  An After Visit Summary was printed and given to the patient.

## 2017-07-07 NOTE — Patient Instructions (Signed)

## 2017-11-26 ENCOUNTER — Encounter: Payer: Self-pay | Admitting: Family Medicine

## 2018-01-06 ENCOUNTER — Other Ambulatory Visit: Payer: Self-pay | Admitting: Family Medicine

## 2018-01-06 DIAGNOSIS — Z1231 Encounter for screening mammogram for malignant neoplasm of breast: Secondary | ICD-10-CM

## 2018-01-07 ENCOUNTER — Telehealth: Payer: Self-pay

## 2018-01-07 NOTE — Telephone Encounter (Signed)
It is okay to schedule her for a 430 appointment on a Monday, Wednesday, or Friday.

## 2018-01-07 NOTE — Telephone Encounter (Signed)
Left the patient a voicemail to call the office to schedule. Transfer the call to [place].

## 2018-01-07 NOTE — Telephone Encounter (Signed)
Sent to [place] please schedule pt for a CPE thanks

## 2018-01-07 NOTE — Telephone Encounter (Signed)
Please schedule pt for CPE I am unable to do so thanks!

## 2018-01-07 NOTE — Telephone Encounter (Signed)
Copied from [place] [id number]. Topic: Appointment Scheduling - Scheduling Inquiry for Clinic >> Jan 06, 2018  2:12 PM Mylinda Latina, NT wrote: Reason for CRM: Patient called and states she needs an office visit with Dr. Caryl Bis and labs. He has not available appts until next year . Please call to schedule appt  CB# [phone] leave detailed message on voicemail

## 2018-01-08 NOTE — Telephone Encounter (Signed)
Pt returned call to [place]. Please call back after 2:30pm today if possible. 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

## 2018-01-08 NOTE — Telephone Encounter (Signed)
Patient called to speak with Maria Jennings.

## 2018-01-12 ENCOUNTER — Other Ambulatory Visit: Payer: Self-pay | Admitting: Family Medicine

## 2018-01-12 ENCOUNTER — Ambulatory Visit
Admission: RE | Admit: 2018-01-12 | Discharge: 2018-01-12 | Disposition: A | Discharge status: Home / Self Care | Payer: 59 | Source: Ambulatory Visit | Attending: Family Medicine | Admitting: Family Medicine

## 2018-01-12 DIAGNOSIS — Z1231 Encounter for screening mammogram for malignant neoplasm of breast: Secondary | ICD-10-CM | POA: Diagnosis not present

## 2018-01-12 DIAGNOSIS — R928 Other abnormal and inconclusive findings on diagnostic imaging of breast: Secondary | ICD-10-CM

## 2018-01-12 DIAGNOSIS — N6320 Unspecified lump in the left breast, unspecified quadrant: Secondary | ICD-10-CM

## 2018-01-15 ENCOUNTER — Ambulatory Visit (INDEPENDENT_AMBULATORY_CARE_PROVIDER_SITE_OTHER): Payer: 59 | Admitting: Family Medicine

## 2018-01-15 ENCOUNTER — Encounter: Payer: Self-pay | Admitting: Family Medicine

## 2018-01-15 VITALS — BP 120/84 | HR 80 | Temp 98.7°F | Ht 67.75 in | Wt 180.6 lb

## 2018-01-15 DIAGNOSIS — Z0000 Encounter for general adult medical examination without abnormal findings: Secondary | ICD-10-CM | POA: Diagnosis not present

## 2018-01-15 DIAGNOSIS — Z13220 Encounter for screening for lipoid disorders: Secondary | ICD-10-CM

## 2018-01-15 DIAGNOSIS — R8290 Unspecified abnormal findings in urine: Secondary | ICD-10-CM

## 2018-01-15 DIAGNOSIS — E663 Overweight: Secondary | ICD-10-CM | POA: Diagnosis not present

## 2018-01-15 DIAGNOSIS — Z5181 Encounter for therapeutic drug level monitoring: Secondary | ICD-10-CM

## 2018-01-15 DIAGNOSIS — R928 Other abnormal and inconclusive findings on diagnostic imaging of breast: Secondary | ICD-10-CM

## 2018-01-15 NOTE — Progress Notes (Signed)
Tommi Rumps, MD Phone: [phone]  Maria Jennings is a 47 y.o. female who presents today for CPE.  Exercise: She is very active.  Bikes, paddle boards, and swims. Diet: Very healthy.  She is been doing weight watchers and is down 10 pounds. Recently had a mammogram which was abnormal.  I discussed this with her today. Menses once monthly lasting 3 to 4 days.  She follows with GYN for pelvic and breast exams. Most recent Pap smear was negative. Colon cancer history in her grandmother and cousin on her mothers side though no first-degree relatives with this. No history of breast cancer or ovarian cancer in her family. No tobacco use or illicit drug use.  Occasional alcohol use. She sees a Pharmacist, community twice yearly.  Ophthalmologist once yearly.  Active Ambulatory Problems    Diagnosis Date Noted  . Routine general medical examination at a health care facility 08/16/2012  . Optic neuritis 11/04/2013  . Multiple sclerosis ([place]) 11/04/2013  . Chest pain 07/06/2015  . DOE (dyspnea on exertion) 07/06/2015  . Vitamin D deficiency 06/11/2016  . High risk medication use 06/05/2014  . Abnormal mammogram 01/15/2018   Resolved Ambulatory Problems    Diagnosis Date Noted  . Urinary tract infection 06/25/2011  . UTI (urinary tract infection) 10/06/2012  . Fever, unspecified 10/17/2013  . Left maxillary sinusitis 10/17/2013  . 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 GERD (gastroesophageal reflux disease) 11/23/2014  . Sinusitis, acute maxillary 07/18/2015   Past Medical History:  Diagnosis Date  . Lumbago   . Sinusitis   . Vitamin D deficiency     Family History  Problem Relation Age of Onset  . Hypertension Mother   . Hyperlipidemia Mother   . Hypertension Father   . Cancer Maternal Grandmother 63       colon  . Cancer Cousin 52       colon  . Breast cancer Neg Hx     Social History   Socioeconomic History  . Marital status: Married    Spouse name: Not on file    . Number of children: 2  . Years of education: Not on file  . Highest education level: Not on file  Occupational History    Employer: [employer]  . Financial resource strain: Not on file  . Food insecurity:    Worry: Not on file    Inability: Not on file  . Transportation needs:    Medical: Not on file    Non-medical: Not on file  Tobacco Use  . Smoking status: Never Smoker  . Smokeless tobacco: Never Used  Substance and Sexual Activity  . Alcohol use: Yes    Alcohol/week: 5.0 standard drinks    Types: 5 Glasses of wine per week  . Drug use: No  . Sexual activity: Yes    Partners: Male    Birth control/protection: None  Lifestyle  . Physical activity:    Days per week: Not on file    Minutes per session: Not on file  . Stress: Not on file  Relationships  . Social connections:    Talks on phone: Not on file    Gets together: Not on file    Attends religious service: Not on file    Active member of club or organization: Not on file    Attends meetings of clubs or organizations: Not on file    Relationship status: Not on file  . Intimate partner violence:    Fear of current  or ex partner: Not on file    Emotionally abused: Not on file    Physically abused: Not on file    Forced sexual activity: Not on file  Other Topics Concern  . Not on file  Social History Narrative   Lives in [place].      Work - Psychologist, forensic    ROS  General:  Negative for nexplained weight loss, fever Skin: Negative for new or changing mole, sore that won't heal HEENT: Negative for trouble hearing, trouble seeing, ringing in ears, mouth sores, hoarseness, change in voice, dysphagia. CV:  Negative for chest pain, dyspnea, edema, palpitations Resp: Negative for cough, dyspnea, hemoptysis GI: Negative for nausea, vomiting, diarrhea, constipation, abdominal pain, melena, hematochezia. GU: Negative for dysuria, incontinence, urinary hesitance, hematuria, vaginal or penile discharge,  polyuria, sexual difficulty, lumps in testicle or breasts MSK: Negative for muscle cramps or aches, joint pain or swelling Neuro: Negative for headaches, weakness, numbness, dizziness, passing out/fainting Psych: Negative for depression, anxiety, memory problems  Objective  Physical Exam Vitals:   01/15/18 1628  BP: 120/84  Pulse: 80  Temp: 98.7 F (37.1 C)  SpO2: 98%    BP Readings from Last 3 Encounters:  01/15/18 120/84  07/07/17 110/70  03/19/17 120/84   Wt Readings from Last 3 Encounters:  01/15/18 180 lb 9.6 oz (81.9 kg)  07/07/17 184 lb (83.5 kg)  03/19/17 186 lb (84.4 kg)    Physical Exam  Constitutional: No distress.  HENT:  Head: Normocephalic and atraumatic.  Mouth/Throat: Oropharynx is clear and moist.  Eyes: Pupils are equal, round, and reactive to light. Conjunctivae are normal.  Neck: Neck supple.  Cardiovascular: Normal rate, regular rhythm and normal heart sounds.  Pulmonary/Chest: Effort normal and breath sounds normal.  Abdominal: Soft. Bowel sounds are normal. She exhibits no distension. There is no tenderness. There is no rebound and no guarding.  Musculoskeletal: She exhibits no edema.  Lymphadenopathy:    She has no cervical adenopathy.  Neurological: She is alert.  Skin: Skin is warm and dry. She is not diaphoretic.  Psychiatric: She has a normal mood and affect.     Assessment/Plan:   Routine general medical examination at a health care facility Physical exam completed today.  Breast and pelvic exam deferred to GYN.  Follow-up diagnostic mammogram has been ordered and she was given the number to call to schedule this.  Encouraged continued diet and exercise.  She notes she was given stool cards to complete through her gynecologist.  I encouraged her to complete these.  She has no first-degree relatives with colon cancer.  She will have her lab work completed next week.  On review of prior labs she had a urinalysis with positive dipstick for  blood.  She will have repeat testing given no microscopy was sent.  Based on documentation it appears she may have been on her menstrual cycle at that time.  Abnormal mammogram Patient was informed of her results and given the phone number to contact to set up follow-up imaging.   Orders Placed This Encounter  Procedures  . Urinalysis, Routine w reflex microscopic    Standing Status:   Future    Standing Expiration Date:   01/16/2019  . CBC w/Diff    Standing Status:   Future    Standing Expiration Date:   01/16/2019  . Lipid panel    Standing Status:   Future    Standing Expiration Date:   01/16/2019  . Vitamin D (25  hydroxy)    Standing Status:   Future    Standing Expiration Date:   01/16/2019  . B12    Standing Status:   Future    Standing Expiration Date:   01/16/2019  . Comp Met (CMET)    Standing Status:   Future    Standing Expiration Date:   01/16/2019  . HgB A1c    Standing Status:   Future    Standing Expiration Date:   01/16/2019  . Urine Microscopic Only    Standing Status:   Future    Standing Expiration Date:   01/16/2019    No orders of the defined types were placed in this encounter.    Tommi Rumps, MD [place]

## 2018-01-15 NOTE — Patient Instructions (Signed)
Nice to see you. Please have your lab work done next week at the lab at the hospital. Please make sure they collect a urine from you. Please continue diet and exercise.

## 2018-01-15 NOTE — Assessment & Plan Note (Signed)
Patient was informed of her results and given the phone number to contact to set up follow-up imaging.

## 2018-01-15 NOTE — Assessment & Plan Note (Signed)
Physical exam completed today.  Breast and pelvic exam deferred to GYN.  Follow-up diagnostic mammogram has been ordered and she was given the number to call to schedule this.  Encouraged continued diet and exercise.  She notes she was given stool cards to complete through her gynecologist.  I encouraged her to complete these.  She has no first-degree relatives with colon cancer.  She will have her lab work completed next week.  On review of prior labs she had a urinalysis with positive dipstick for blood.  She will have repeat testing given no microscopy was sent.  Based on documentation it appears she may have been on her menstrual cycle at that time.

## 2018-01-20 ENCOUNTER — Ambulatory Visit
Admission: RE | Admit: 2018-01-20 | Discharge: 2018-01-20 | Disposition: A | Discharge status: Home / Self Care | Payer: 59 | Source: Ambulatory Visit | Attending: Family Medicine | Admitting: Family Medicine

## 2018-01-20 ENCOUNTER — Other Ambulatory Visit
Admission: RE | Admit: 2018-01-20 | Discharge: 2018-01-20 | Disposition: A | Discharge status: Home / Self Care | Payer: 59 | Source: Ambulatory Visit | Attending: Family Medicine | Admitting: Family Medicine

## 2018-01-20 DIAGNOSIS — E663 Overweight: Secondary | ICD-10-CM | POA: Insufficient documentation

## 2018-01-20 DIAGNOSIS — N6002 Solitary cyst of left breast: Secondary | ICD-10-CM | POA: Insufficient documentation

## 2018-01-20 DIAGNOSIS — N6320 Unspecified lump in the left breast, unspecified quadrant: Secondary | ICD-10-CM

## 2018-01-20 DIAGNOSIS — R922 Inconclusive mammogram: Secondary | ICD-10-CM | POA: Diagnosis not present

## 2018-01-20 DIAGNOSIS — Z5181 Encounter for therapeutic drug level monitoring: Secondary | ICD-10-CM

## 2018-01-20 DIAGNOSIS — R928 Other abnormal and inconclusive findings on diagnostic imaging of breast: Secondary | ICD-10-CM | POA: Insufficient documentation

## 2018-01-20 DIAGNOSIS — Z13220 Encounter for screening for lipoid disorders: Secondary | ICD-10-CM | POA: Insufficient documentation

## 2018-01-20 DIAGNOSIS — R8290 Unspecified abnormal findings in urine: Secondary | ICD-10-CM

## 2018-01-20 LAB — HEMOGLOBIN A1C
Hgb A1c MFr Bld: 5 % (ref 4.8–5.6)
Mean Plasma Glucose: 96.8 mg/dL

## 2018-01-20 LAB — LIPID PANEL
Cholesterol: 177 mg/dL (ref 0–200)
HDL: 53 mg/dL (ref >40)
LDL Cholesterol: 110 mg/dL — ABNORMAL HIGH (ref 0–99)
Total CHOL/HDL Ratio: 3.3 RATIO
Triglycerides: 71 mg/dL (ref <150)
VLDL: 14 mg/dL (ref 0–40)

## 2018-01-20 LAB — CBC WITH DIFFERENTIAL/PLATELET
ABS IMMATURE GRANULOCYTES: 0.01 10*3/uL (ref 0.00–0.07)
BASOS ABS: 0.1 10*3/uL (ref 0.0–0.1)
Basophils Relative: 1 %
EOS PCT: 4 %
Eosinophils Absolute: 0.2 10*3/uL (ref 0.0–0.5)
HCT: 41.9 % (ref 36.0–46.0)
HEMOGLOBIN: 14 g/dL (ref 12.0–15.0)
Immature Granulocytes: 0 %
LYMPHS PCT: 24 %
Lymphs Abs: 1.2 10*3/uL (ref 0.7–4.0)
MCH: 30.4 pg (ref 26.0–34.0)
MCHC: 33.4 g/dL (ref 30.0–36.0)
MCV: 91.1 fL (ref 80.0–100.0)
Monocytes Absolute: 0.4 10*3/uL (ref 0.1–1.0)
Monocytes Relative: 8 %
NEUTROS ABS: 3.2 10*3/uL (ref 1.7–7.7)
NRBC: 0 % (ref 0.0–0.2)
Neutrophils Relative %: 63 %
Platelets: 230 10*3/uL (ref 150–400)
RBC: 4.6 MIL/uL (ref 3.87–5.11)
RDW: 13 % (ref 11.5–15.5)
WBC: 5 10*3/uL (ref 4.0–10.5)

## 2018-01-20 LAB — COMPREHENSIVE METABOLIC PANEL
ALK PHOS: 65 U/L (ref 38–126)
ALT: 14 U/L (ref 0–44)
AST: 16 U/L (ref 15–41)
Albumin: 4.6 g/dL (ref 3.5–5.0)
Anion gap: 10 (ref 5–15)
BUN: 11 mg/dL (ref 6–20)
CALCIUM: 9.4 mg/dL (ref 8.9–10.3)
CO2: 27 mmol/L (ref 22–32)
CREATININE: 0.58 mg/dL (ref 0.44–1.00)
Chloride: 101 mmol/L (ref 98–111)
GFR calc non Af Amer: >60 mL/min (ref >60)
GLUCOSE: 93 mg/dL (ref 70–99)
Potassium: 3.8 mmol/L (ref 3.5–5.1)
Sodium: 138 mmol/L (ref 135–145)
Total Bilirubin: 0.8 mg/dL (ref 0.3–1.2)
Total Protein: 7.5 g/dL (ref 6.5–8.1)

## 2018-01-20 LAB — URINALYSIS, ROUTINE W REFLEX MICROSCOPIC
BILIRUBIN URINE: NEGATIVE
Bacteria, UA: NONE SEEN
Glucose, UA: NEGATIVE mg/dL
Ketones, ur: NEGATIVE mg/dL
Leukocytes, UA: NEGATIVE
NITRITE: NEGATIVE
Protein, ur: NEGATIVE mg/dL
SPECIFIC GRAVITY, URINE: 1.009 (ref 1.005–1.030)
pH: 6 (ref 5.0–8.0)

## 2018-01-20 LAB — VITAMIN B12: Vitamin B-12: 584 pg/mL (ref 180–914)

## 2018-01-21 LAB — VITAMIN D 25 HYDROXY (VIT D DEFICIENCY, FRACTURES): Vit D, 25-Hydroxy: 33.5 ng/mL (ref 30.0–100.0)

## 2018-02-09 DIAGNOSIS — G35 Multiple sclerosis: Secondary | ICD-10-CM | POA: Diagnosis not present

## 2018-04-20 ENCOUNTER — Encounter: Payer: Self-pay | Admitting: Family Medicine

## 2018-04-20 DIAGNOSIS — M25512 Pain in left shoulder: Principal | ICD-10-CM

## 2018-04-20 DIAGNOSIS — M25511 Pain in right shoulder: Secondary | ICD-10-CM

## 2018-04-23 ENCOUNTER — Telehealth: Payer: Self-pay | Admitting: *Deleted

## 2018-04-23 NOTE — Telephone Encounter (Signed)
Copied from [place] [phone]. Topic: General - Other >> Apr 23, 2018  2:23 PM Yvette Rack wrote: Reason for CRM: pt calling stating that she request not to have her referral in the que she would like to pick the referral order up in  the office and take it to another practice please call pt at [phone]

## 2018-04-23 NOTE — Telephone Encounter (Signed)
Patient aware referral will be placed at front desk for pick up.

## 2018-04-28 DIAGNOSIS — T1512XA Foreign body in conjunctival sac, left eye, initial encounter: Secondary | ICD-10-CM | POA: Diagnosis not present

## 2018-07-09 ENCOUNTER — Ambulatory Visit: Payer: 59 | Admitting: Certified Nurse Midwife

## 2018-10-01 ENCOUNTER — Ambulatory Visit: Payer: 59 | Admitting: Certified Nurse Midwife

## 2018-11-03 ENCOUNTER — Other Ambulatory Visit: Payer: Self-pay

## 2018-11-05 ENCOUNTER — Encounter: Payer: Self-pay | Admitting: Certified Nurse Midwife

## 2018-11-05 ENCOUNTER — Other Ambulatory Visit (HOSPITAL_COMMUNITY)
Admission: RE | Admit: 2018-11-05 | Discharge: 2018-11-05 | Disposition: A | Discharge status: Home / Self Care | Payer: 59 | Source: Ambulatory Visit | Attending: Certified Nurse Midwife | Admitting: Certified Nurse Midwife

## 2018-11-05 ENCOUNTER — Other Ambulatory Visit: Payer: Self-pay

## 2018-11-05 ENCOUNTER — Ambulatory Visit (INDEPENDENT_AMBULATORY_CARE_PROVIDER_SITE_OTHER): Payer: 59 | Admitting: Certified Nurse Midwife

## 2018-11-05 VITALS — BP 110/68 | HR 64 | Temp 97.4°F | Resp 16 | Ht 67.75 in | Wt 184.0 lb

## 2018-11-05 DIAGNOSIS — E559 Vitamin D deficiency, unspecified: Secondary | ICD-10-CM

## 2018-11-05 DIAGNOSIS — Z1211 Encounter for screening for malignant neoplasm of colon: Secondary | ICD-10-CM | POA: Diagnosis not present

## 2018-11-05 DIAGNOSIS — Z0000 Encounter for general adult medical examination without abnormal findings: Secondary | ICD-10-CM

## 2018-11-05 DIAGNOSIS — Z124 Encounter for screening for malignant neoplasm of cervix: Secondary | ICD-10-CM | POA: Insufficient documentation

## 2018-11-05 DIAGNOSIS — Z01419 Encounter for gynecological examination (general) (routine) without abnormal findings: Secondary | ICD-10-CM

## 2018-11-05 NOTE — Progress Notes (Signed)
48 y.o. G18P2002 Married  Caucasian Fe here for annual exam. Periods still 3-5 days slightly heavy, no essential changes. Contraception Withdrawal. Sees PCP yearly. Dr. Brigitte Pulse at [facility] neurology. Feeling well. MS is stable at this point. Staying busy with work. No health issues today. Desires screening labs today.  Patient's last menstrual period was 10/27/2018 (exact date).          Sexually active: Yes.    The current method of family planning is none.   Exercising: Yes.    biking, [place], ets Smoker:  no  Review of Systems  Constitutional: Negative.   HENT: Negative.   Eyes: Negative.   Respiratory: Negative.   Cardiovascular: Negative.   Gastrointestinal: Negative.   Genitourinary: Negative.   Musculoskeletal: Negative.   Skin: Negative.   Neurological: Negative.   Endo/Heme/Allergies: Negative.   Psychiatric/Behavioral: Negative.     Health Maintenance: Pap:  06-04-15 neg HPV HR neg History of Abnormal Pap: no MMG:  10/19 bilateral & left breast u/s category c density birads 2:neg Self Breast exams: no Colonoscopy: none BMD:   none TDaP:  UTD per patient Shingles: no Pneumonia: no Hep C and HIV: not done Labs: if needed   reports that she has never smoked. She has never used smokeless tobacco. She reports current alcohol use of about 5.0 standard drinks of alcohol per week. She reports that she does not use drugs.  Past Medical History:  Diagnosis Date  . Lumbago   . Multiple sclerosis ([place])   . Sinusitis   . Vitamin D deficiency     Past Surgical History:  Procedure Laterality Date  . CESAREAN SECTION     x 1  . KNEE ARTHROSCOPY Right 07/2013    Current Outpatient Medications  Medication Sig Dispense Refill  . Cholecalciferol (VITAMIN D3) 5000 units CAPS Take 1 capsule by mouth 2 (two) times a week.    . cyanocobalamin 1000 MCG tablet Take 1,000 mcg by mouth 2 (two) times a week.    . Dimethyl Fumarate 240 MG CPDR Take 120 mg by mouth 2 (two) times daily.      . fexofenadine-pseudoephedrine (ALLEGRA-D 24) 180-240 MG 24 hr tablet Take by mouth as needed.     No current facility-administered medications for this visit.     Family History  Problem Relation Age of Onset  . Hypertension Mother   . Hyperlipidemia Mother   . Hypertension Father   . Cancer Maternal Grandmother 48       colon  . Cancer Cousin 52       colon  . Breast cancer Neg Hx     ROS:  Pertinent items are noted in HPI.  Otherwise, a comprehensive ROS was negative.  Exam:   BP 110/68   Pulse 64   Temp (!) 97.4 F (36.3 C) (Skin)   Resp 16   Ht 5' 7.75" (1.721 m)   Wt 184 lb (83.5 kg)   LMP 10/27/2018 (Exact Date)   BMI 28.18 kg/m  Height: 5' 7.75" (172.1 cm) Ht Readings from Last 3 Encounters:  11/05/18 5' 7.75" (1.721 m)  01/15/18 5' 7.75" (1.721 m)  07/07/17 5' 8.5" (1.74 m)    General appearance: alert, cooperative and appears stated age Head: Normocephalic, without obvious abnormality, atraumatic Neck: no adenopathy, supple, symmetrical, trachea midline and thyroid normal to inspection and palpation Lungs: clear to auscultation bilaterally Breasts: normal appearance, no masses or tenderness, No nipple retraction or dimpling, No nipple discharge or bleeding, No axillary or  supraclavicular adenopathy Heart: regular rate and rhythm Abdomen: soft, non-tender; no masses,  no organomegaly Extremities: extremities normal, atraumatic, no cyanosis or edema Skin: Skin color, texture, turgor normal. No rashes or lesions Lymph nodes: Cervical, supraclavicular, and axillary nodes normal. No abnormal inguinal nodes palpated Neurologic: Grossly normal   Pelvic: External genitalia:  no lesions              Urethra:  normal appearing urethra with no masses, tenderness or lesions              Bartholin's and Skene's: normal                 Vagina: normal appearing vagina with normal color and discharge, no lesions              Cervix: multiparous appearance, no  cervical motion tenderness and no lesions              Pap taken: Yes.   Bimanual Exam:  Uterus:  normal size, contour, position, consistency, mobility, non-tender              Adnexa: normal adnexa and no mass, fullness, tenderness               Rectovaginal: Confirms               Anus:  normal sphincter tone, no lesions  Chaperone present: yes  A:  Well Woman with normal exam  Contraception withdrawal  History of MS managed through [facility], stable per patient  Colonoscopy due, will do IFOB  Screening labs  P:   Reviewed health and wellness pertinent to exam  Continue follow up with MD as indicated.  Risks/benefits of colonoscopy, cologard discussed. Patient would like to do IFOB now and plan colonoscopy in 2021.   IFOB dispensed with instructions  Labs: CBC, TSH, Lipid panel, CMP, Hgb A1-C, Vitamin D  Pap smear: yes   counseled on breast self exam, mammography screening, feminine hygiene, adequate intake of calcium and vitamin D, diet and exercise  return annually or prn  An After Visit Summary was printed and given to the patient.

## 2018-11-08 LAB — COMPREHENSIVE METABOLIC PANEL
ALT: 9 IU/L (ref 0–32)
AST: 17 IU/L (ref 0–40)
Albumin/Globulin Ratio: 2 (ref 1.2–2.2)
Albumin: 4.5 g/dL (ref 3.8–4.8)
Alkaline Phosphatase: 72 IU/L (ref 39–117)
BUN/Creatinine Ratio: 19 (ref 9–23)
BUN: 11 mg/dL (ref 6–24)
Bilirubin Total: 0.3 mg/dL (ref 0.0–1.2)
CO2: 23 mmol/L (ref 20–29)
Calcium: 9.1 mg/dL (ref 8.7–10.2)
Chloride: 102 mmol/L (ref 96–106)
Creatinine, Ser: 0.59 mg/dL (ref 0.57–1.00)
GFR calc Af Amer: 124 mL/min/{1.73_m2} (ref >59)
GFR calc non Af Amer: 107 mL/min/{1.73_m2} (ref >59)
Globulin, Total: 2.3 g/dL (ref 1.5–4.5)
Glucose: 80 mg/dL (ref 65–99)
Potassium: 4.2 mmol/L (ref 3.5–5.2)
Sodium: 139 mmol/L (ref 134–144)
Total Protein: 6.8 g/dL (ref 6.0–8.5)

## 2018-11-08 LAB — VITAMIN D 25 HYDROXY (VIT D DEFICIENCY, FRACTURES): Vit D, 25-Hydroxy: 35.4 ng/mL (ref 30.0–100.0)

## 2018-11-08 LAB — HEMOGLOBIN A1C
Est. average glucose Bld gHb Est-mCnc: 97 mg/dL
Hgb A1c MFr Bld: 5 % (ref 4.8–5.6)

## 2018-11-08 LAB — LIPID PANEL
Chol/HDL Ratio: 3.2 ratio (ref 0.0–4.4)
Cholesterol, Total: 187 mg/dL (ref 100–199)
HDL: 59 mg/dL (ref >39)
LDL Calculated: 111 mg/dL — ABNORMAL HIGH (ref 0–99)
Triglycerides: 87 mg/dL (ref 0–149)
VLDL Cholesterol Cal: 17 mg/dL (ref 5–40)

## 2018-11-08 LAB — CBC
Hematocrit: 44.6 % (ref 34.0–46.6)
Hemoglobin: 14.3 g/dL (ref 11.1–15.9)
MCH: 30.6 pg (ref 26.6–33.0)
MCHC: 32.1 g/dL (ref 31.5–35.7)
MCV: 95 fL (ref 79–97)
Platelets: 259 10*3/uL (ref 150–450)
RBC: 4.68 x10E6/uL (ref 3.77–5.28)
RDW: 13.7 % (ref 11.7–15.4)
WBC: 4.8 10*3/uL (ref 3.4–10.8)

## 2018-11-08 LAB — TSH: TSH: 2.22 u[IU]/mL (ref 0.450–4.500)

## 2018-11-10 ENCOUNTER — Telehealth: Payer: Self-pay

## 2018-11-10 LAB — CYTOLOGY - PAP
Diagnosis: NEGATIVE
HPV: NOT DETECTED

## 2018-11-10 NOTE — Telephone Encounter (Signed)
Patient notified of results as written by provider 

## 2018-11-10 NOTE — Telephone Encounter (Signed)
Patient returned call. Ok to leave a detailed message on voicemail.

## 2018-11-10 NOTE — Telephone Encounter (Signed)
-----   Message from Regina Eck, CNM sent at 11/10/2018 12:35 PM EDT ----- Notify patient her pap smear is negative, HPV not detected 02 Yeast noted on pap. She can do Monistat 7 Cream OTC or can do Rx Terazol 7 if symptomatic

## 2018-11-10 NOTE — Telephone Encounter (Signed)
Unable to leave voicemail.

## 2019-02-04 DIAGNOSIS — D2271 Melanocytic nevi of right lower limb, including hip: Secondary | ICD-10-CM | POA: Diagnosis not present

## 2019-02-04 DIAGNOSIS — D2272 Melanocytic nevi of left lower limb, including hip: Secondary | ICD-10-CM | POA: Diagnosis not present

## 2019-02-04 DIAGNOSIS — D2261 Melanocytic nevi of right upper limb, including shoulder: Secondary | ICD-10-CM | POA: Diagnosis not present

## 2019-02-04 DIAGNOSIS — L738 Other specified follicular disorders: Secondary | ICD-10-CM | POA: Diagnosis not present

## 2019-02-04 DIAGNOSIS — L821 Other seborrheic keratosis: Secondary | ICD-10-CM | POA: Diagnosis not present

## 2019-02-04 DIAGNOSIS — D2262 Melanocytic nevi of left upper limb, including shoulder: Secondary | ICD-10-CM | POA: Diagnosis not present

## 2019-02-04 DIAGNOSIS — D225 Melanocytic nevi of trunk: Secondary | ICD-10-CM | POA: Diagnosis not present

## 2019-02-08 LAB — FECAL OCCULT BLOOD, IMMUNOCHEMICAL

## 2019-02-18 ENCOUNTER — Other Ambulatory Visit: Payer: Self-pay

## 2019-02-18 DIAGNOSIS — Z1211 Encounter for screening for malignant neoplasm of colon: Secondary | ICD-10-CM

## 2019-02-18 NOTE — Progress Notes (Signed)
Pt stopped by office to pick up IFOB kit today 02/18/19. Given instructions when to drop off in office. Pt verbalized understanding.  Future order placed.  Will route to D. Hollice Espy, CNM for review. Encounter closed.

## 2019-05-03 ENCOUNTER — Ambulatory Visit (INDEPENDENT_AMBULATORY_CARE_PROVIDER_SITE_OTHER): Payer: 59 | Admitting: Family Medicine

## 2019-05-03 ENCOUNTER — Other Ambulatory Visit: Payer: Self-pay

## 2019-05-03 ENCOUNTER — Encounter: Payer: Self-pay | Admitting: Family Medicine

## 2019-05-03 VITALS — Ht 67.0 in | Wt 174.0 lb

## 2019-05-03 DIAGNOSIS — G35 Multiple sclerosis: Secondary | ICD-10-CM | POA: Diagnosis not present

## 2019-05-03 DIAGNOSIS — Z1231 Encounter for screening mammogram for malignant neoplasm of breast: Secondary | ICD-10-CM

## 2019-05-03 DIAGNOSIS — Z0000 Encounter for general adult medical examination without abnormal findings: Secondary | ICD-10-CM | POA: Diagnosis not present

## 2019-05-03 DIAGNOSIS — Z1152 Encounter for screening for COVID-19: Secondary | ICD-10-CM | POA: Diagnosis not present

## 2019-05-03 NOTE — Progress Notes (Signed)
Virtual Visit via video Note  This visit type was conducted due to national recommendations for restrictions regarding the COVID-19 pandemic (e.g. social distancing).  This format is felt to be most appropriate for this patient at this time.  All issues noted in this document were discussed and addressed.  No physical exam was performed (except for noted visual exam findings with Video Visits).   I connected with Maria Jennings today at  8:30 AM EST by a video enabled telemedicine application and verified that I am speaking with the correct person using two identifiers. Location patient: Conservator, museum/gallery provider: work  Persons participating in the virtual visit: patient, provider  I discussed the limitations, risks, security and privacy concerns of performing an evaluation and management service by telephone and the availability of in person appointments. I also discussed with the patient that there may be a patient responsible charge related to this service. The patient expressed understanding and agreed to proceed.  Reason for visit: CPE  HPI: Exercise: Walking and doing yoga currently.  When it is warmer she does biking and paddle boarding. Diet: Has been doing intermittent fasting.  She is eating lots of vegetables and healthy foods.  She is down about 12 pounds. Mammogram is due.  Colon cancer screening is due.  She was given an iFOB through GYN.  She would prefer to have colon cancer screening this way given that elective colonoscopies have been put off due to the COVID-19 pandemic.  Pap smear is up-to-date 11/05/2018 with GYN with NILM and negative HPV. No history of breast cancer or ovarian cancer in her family.  No first-degree relatives with colon cancer though she does have a maternal grandmother and maternal cousin that have had colon cancer. She may be due for a tetanus vaccine.  She will check with employee health.  Flu vaccine up-to-date. HIV screening up-to-date. No tobacco use  or illicit drug use.  1 glass of red wine about 5 days a week. Sees a dentist and an ophthalmologist. She is due for lab work for her MS. She wonders about getting the COVID-19 antibody test.  She is unsure if she is going to get the vaccine.  She has not had any COVID-19 symptoms.  ROS: See pertinent positives and negatives per HPI.  Past Medical History:  Diagnosis Date  . Lumbago   . Multiple sclerosis ([place])   . Sinusitis   . Vitamin D deficiency     Past Surgical History:  Procedure Laterality Date  . CESAREAN SECTION     x 1  . KNEE ARTHROSCOPY Right 07/2013    Family History  Problem Relation Age of Onset  . Hypertension Mother   . Hyperlipidemia Mother   . Hypertension Father   . Cancer Maternal Grandmother 55       colon  . Cancer Cousin 52       colon  . Breast cancer Neg Hx     SOCIAL HX: Non-smoker   Current Outpatient Medications:  .  Cholecalciferol (VITAMIN D3) 5000 units CAPS, Take 1 capsule by mouth 2 (two) times a week., Disp: , Rfl:  .  cyanocobalamin 1000 MCG tablet, Take 1,000 mcg by mouth 2 (two) times a week., Disp: , Rfl:  .  Dimethyl Fumarate 240 MG CPDR, Take 120 mg by mouth 2 (two) times daily. , Disp: , Rfl:  .  fexofenadine-pseudoephedrine (ALLEGRA-D 24) 180-240 MG 24 hr tablet, Take by mouth as needed., Disp: , Rfl:   EXAM:  VITALS per patient if applicable:  GENERAL: alert, oriented, appears well and in no acute distress  HEENT: atraumatic, conjunttiva clear, no obvious abnormalities on inspection of external nose and ears  NECK: normal movements of the head and neck  LUNGS: on inspection no signs of respiratory distress, breathing rate appears normal, no obvious gross SOB, gasping or wheezing  CV: no obvious cyanosis  MS: moves all visible extremities without noticeable abnormality  PSYCH/NEURO: pleasant and cooperative, no obvious depression or anxiety, speech and thought processing grossly intact  ASSESSMENT AND  PLAN:  Discussed the following assessment and plan:  Routine general medical examination at a health care facility Physical exam completed.  Encouraged continued exercise and dietary changes.  She will call to schedule her mammogram.  She will drop her fecal occult blood testing kit here at our office as she has trouble getting this to her gynecologist.  I did confirm that she could drop this with our lab.  Discussed at some point getting a colonoscopy though given the current situation with COVID-19 we will proceed with fecal occult testing.  I encouraged her to check with employee health regarding tetanus vaccine.  Cholesterol is adequate given ASCVD risk score being less than 5%.  Labs as outlined below.  Encounter for screening for COVID-19 Patient is requesting COVID-19 antibody testing.  I did discuss that we do not know how long antibodies last from prior infection and how long people are immune related to this.  She wants to have this information so she can make a decision on getting the vaccine.  I did discuss even if she had antibodies it would not preclude her from getting the vaccine.   Orders Placed This Encounter  Procedures  . MM 3D SCREEN BREAST BILATERAL    Standing Status:   Future    Standing Expiration Date:   06/30/2020    Order Specific Question:   Reason for Exam (SYMPTOM  OR DIAGNOSIS REQUIRED)    Answer:   breast cancer screening    Order Specific Question:   Is the patient pregnant?    Answer:   No    Order Specific Question:   Preferred imaging location?    Answer:   [facility]  . B12    Standing Status:   Future    Standing Expiration Date:   05/02/2020  . CBC w/Diff    Standing Status:   Future    Standing Expiration Date:   05/02/2020  . Vitamin D (25 hydroxy)    Standing Status:   Future    Standing Expiration Date:   05/02/2020  . SARS-COV-2 IgG    Standing Status:   Future    Standing Expiration Date:   05/02/2020    No orders of the defined  types were placed in this encounter.    I discussed the assessment and treatment plan with the patient. The patient was provided an opportunity to ask questions and all were answered. The patient agreed with the plan and demonstrated an understanding of the instructions.   The patient was advised to call back or seek an in-person evaluation if the symptoms worsen or if the condition fails to improve as anticipated.   Tommi Rumps, MD

## 2019-05-03 NOTE — Assessment & Plan Note (Signed)
Patient is requesting COVID-19 antibody testing.  I did discuss that we do not know how long antibodies last from prior infection and how long people are immune related to this.  She wants to have this information so she can make a decision on getting the vaccine.  I did discuss even if she had antibodies it would not preclude her from getting the vaccine.

## 2019-05-03 NOTE — Assessment & Plan Note (Signed)
Physical exam completed.  Encouraged continued exercise and dietary changes.  She will call to schedule her mammogram.  She will drop her fecal occult blood testing kit here at our office as she has trouble getting this to her gynecologist.  I did confirm that she could drop this with our lab.  Discussed at some point getting a colonoscopy though given the current situation with COVID-19 we will proceed with fecal occult testing.  I encouraged her to check with employee health regarding tetanus vaccine.  Cholesterol is adequate given ASCVD risk score being less than 5%.  Labs as outlined below.

## 2019-05-06 ENCOUNTER — Telehealth: Payer: Self-pay | Admitting: Family Medicine

## 2019-05-06 DIAGNOSIS — G35 Multiple sclerosis: Secondary | ICD-10-CM

## 2019-05-06 DIAGNOSIS — Z1152 Encounter for screening for COVID-19: Secondary | ICD-10-CM

## 2019-05-06 NOTE — Telephone Encounter (Signed)
Patient is going to go to [facility] for her fasting labs. Please change labs so [facility] can draw. Thanks

## 2019-05-06 NOTE — Telephone Encounter (Signed)
Orders changed. 

## 2019-05-06 NOTE — Telephone Encounter (Signed)
Patient is going to go to [facility] for her fasting labs. Please change labs so [facility] can draw. Thanks Neelah Mannings,cma

## 2019-05-09 ENCOUNTER — Telehealth: Payer: Self-pay | Admitting: Pharmacist

## 2019-05-09 NOTE — Telephone Encounter (Signed)
Lvm informing the patient the she can get labs drawn at [facility], the order was placed.  Ryland Tungate,cma

## 2019-05-09 NOTE — Telephone Encounter (Signed)
Called patient to schedule an appointment for the [employer] [facility]. I was unable to reach the patient so I left a HIPAA-compliant message requesting that the patient return my call.   

## 2019-05-10 ENCOUNTER — Other Ambulatory Visit: Payer: Self-pay

## 2019-05-10 ENCOUNTER — Ambulatory Visit (HOSPITAL_BASED_OUTPATIENT_CLINIC_OR_DEPARTMENT_OTHER): Payer: 59 | Admitting: Pharmacist

## 2019-05-10 DIAGNOSIS — Z79899 Other long term (current) drug therapy: Secondary | ICD-10-CM

## 2019-05-10 MED ORDER — DIMETHYL FUMARATE 240 MG PO CPDR: 240.0000 mg | DELAYED_RELEASE_CAPSULE | Freq: Two times a day (BID) | ORAL | 9 refills | Status: DC

## 2019-05-10 NOTE — Progress Notes (Signed)
S: Patient presents today for review of their specialty medication.   Patient is currently takingTecfidera(dimethyl fumarate) forMS. Patient is managed byDr. Clifton Custard this.  Adherence:confirms  Efficacy:pt is happy with his results  Dosing:240 mg BID  Renal adjustment: no adjustment necessary  Hepatic adjustment: no adjustment necessary  Dose adjustment for toxicity: Flushing, GI intolerance, or intolerance to maintenance dose: Consider temporary dose reduction to 120 mg twice daily (resume recommended maintenance dose of 240 mg twice daily within 4 weeks). Consider discontinuation in patients who cannot tolerate return to the maintenance dose.  Hepatic injury (suspected drug-induced), clinically significant: Discontinue treatment.  Lymphocyte count <500/mm3 persisting for >6 months: Consider treatment interruption.  Serious infection: Consider withholding treatment until infection resolves.  Current adverse effects: Flushing, skin rash, or puritus: none S/sx of infection:none GI upset:none S/sx ofheptotoxicity:none  O: Labs followed by patient's PCP. WNL per pt.  BMP Latest Ref Rng & Units 11/05/2018 01/20/2018 02/05/2017  Glucose 65 - 99 mg/dL 80 93 89  BUN 6 - 24 mg/dL 11 11 12   Creatinine 0.57 - 1.00 mg/dL 0.59 0.58 0.57  BUN/Creat Ratio 9 - 23 19 - -  Sodium 134 - 144 mmol/L 139 138 137  Potassium 3.5 - 5.2 mmol/L 4.2 3.8 3.9  Chloride 96 - 106 mmol/L 102 101 101  CO2 20 - 29 mmol/L 23 27 28   Calcium 8.7 - 10.2 mg/dL 9.1 9.4 9.5   CBC    Component Value Date/Time   WBC 4.8 11/05/2018 1420   WBC 5.0 01/20/2018 1145   RBC 4.68 11/05/2018 1420   RBC 4.60 01/20/2018 1145   HGB 14.3 11/05/2018 1420   HCT 44.6 11/05/2018 1420   PLT 259 11/05/2018 1420   MCV 95 11/05/2018 1420   MCV 90 12/15/2013 0752   MCH 30.6 11/05/2018 1420   MCH 30.4 01/20/2018 1145   MCHC 32.1 11/05/2018 1420   MCHC 33.4 01/20/2018 1145   RDW 13.7 11/05/2018  1420   RDW 15.8 (H) 12/15/2013 0752   LYMPHSABS 1.2 01/20/2018 1145   LYMPHSABS 1.2 12/15/2013 0752   MONOABS 0.4 01/20/2018 1145   MONOABS 0.5 12/15/2013 0752   EOSABS 0.2 01/20/2018 1145   EOSABS 0.2 12/15/2013 0752   BASOSABS 0.1 01/20/2018 1145   BASOSABS 0.0 12/15/2013 0752    A/P: 1. Medication review: Patient is currently onTecfideraforMSand is tolerating it well. Reviewed the medication with the patient, including the following:dimethyl fumarate activates the Nrf2 pathway, which is believed to result in anti-inflammatory and cytoprotective properties.Themedication is oral and should be swallowed whole. Administering with a high-fat, high-protein meal may decrease flushing and GI side effects.Possible adverse effects include skin flushing, pruritus, GI upset, albuminura, infection, lymphocytopenia, and increased liver transaminases.Cases of PML have been reported with severe, long-standing lymphopenia identified as the primary risk for PML. Dose adjustments for toxicities have been summarized above.No recommendations for any changes at this time.  Benard Halsted, PharmD, [place] [phone]

## 2019-05-11 ENCOUNTER — Encounter: Payer: Self-pay | Admitting: Family Medicine

## 2019-05-11 ENCOUNTER — Other Ambulatory Visit
Admission: RE | Admit: 2019-05-11 | Discharge: 2019-05-11 | Disposition: A | Discharge status: Home / Self Care | Payer: 59 | Source: Ambulatory Visit | Attending: Family Medicine | Admitting: Family Medicine

## 2019-05-11 DIAGNOSIS — G35 Multiple sclerosis: Secondary | ICD-10-CM | POA: Diagnosis not present

## 2019-05-11 DIAGNOSIS — Z0184 Encounter for antibody response examination: Secondary | ICD-10-CM | POA: Diagnosis not present

## 2019-05-11 DIAGNOSIS — Z1152 Encounter for screening for COVID-19: Secondary | ICD-10-CM

## 2019-05-11 LAB — CBC WITH DIFFERENTIAL/PLATELET
Abs Immature Granulocytes: 0 10*3/uL (ref 0.00–0.07)
Basophils Absolute: 0 10*3/uL (ref 0.0–0.1)
Basophils Relative: 1 %
Eosinophils Absolute: 0.2 10*3/uL (ref 0.0–0.5)
Eosinophils Relative: 6 %
HCT: 41.6 % (ref 36.0–46.0)
Hemoglobin: 14 g/dL (ref 12.0–15.0)
Immature Granulocytes: 0 %
Lymphocytes Relative: 23 %
Lymphs Abs: 0.9 10*3/uL (ref 0.7–4.0)
MCH: 30.8 pg (ref 26.0–34.0)
MCHC: 33.7 g/dL (ref 30.0–36.0)
MCV: 91.6 fL (ref 80.0–100.0)
Monocytes Absolute: 0.3 10*3/uL (ref 0.1–1.0)
Monocytes Relative: 8 %
Neutro Abs: 2.5 10*3/uL (ref 1.7–7.7)
Neutrophils Relative %: 62 %
Platelets: 223 10*3/uL (ref 150–400)
RBC: 4.54 MIL/uL (ref 3.87–5.11)
RDW: 12.3 % (ref 11.5–15.5)
WBC: 4.1 10*3/uL (ref 4.0–10.5)
nRBC: 0 % (ref 0.0–0.2)

## 2019-05-11 LAB — VITAMIN D 25 HYDROXY (VIT D DEFICIENCY, FRACTURES): Vit D, 25-Hydroxy: 27.01 ng/mL — ABNORMAL LOW (ref 30–100)

## 2019-05-11 LAB — VITAMIN B12: Vitamin B-12: 367 pg/mL (ref 180–914)

## 2019-05-11 NOTE — Addendum Note (Signed)
Addended by: Santiago Bur on: 05/11/2019 09:02 AM   Modules accepted: Orders

## 2019-05-11 NOTE — Addendum Note (Signed)
Addended by: Santiago Bur on: 05/11/2019 09:03 AM   Modules accepted: Orders

## 2019-05-12 LAB — SAR COV2 SEROLOGY (COVID19)AB(IGG),IA: SARS-CoV-2 Ab, IgG: NONREACTIVE

## 2019-05-12 MED FILL — DIMETHYL FUMARATE 240 MG CP: 240 | 30 days supply | Qty: 60 | Fill #0

## 2019-05-18 ENCOUNTER — Encounter: Payer: Self-pay | Admitting: Family Medicine

## 2019-06-20 MED FILL — DIMETHYL FUMARATE 240 MG CP: 240 | 30 days supply | Qty: 60 | Fill #1

## 2019-07-04 ENCOUNTER — Encounter: Payer: Self-pay | Admitting: Certified Nurse Midwife

## 2019-07-15 MED FILL — DIMETHYL FUMARATE 240 MG CP: 240 | 30 days supply | Qty: 60 | Fill #2

## 2019-07-22 ENCOUNTER — Ambulatory Visit
Admission: RE | Admit: 2019-07-22 | Discharge: 2019-07-22 | Disposition: A | Discharge status: Home / Self Care | Payer: 59 | Source: Ambulatory Visit | Attending: Family Medicine | Admitting: Family Medicine

## 2019-07-22 DIAGNOSIS — Z1231 Encounter for screening mammogram for malignant neoplasm of breast: Secondary | ICD-10-CM | POA: Diagnosis not present

## 2019-08-11 MED FILL — DIMETHYL FUMARATE 240 MG CP: 240 | 30 days supply | Qty: 60 | Fill #3

## 2019-08-27 DIAGNOSIS — Z20828 Contact with and (suspected) exposure to other viral communicable diseases: Secondary | ICD-10-CM | POA: Diagnosis not present

## 2019-08-27 DIAGNOSIS — Z03818 Encounter for observation for suspected exposure to other biological agents ruled out: Secondary | ICD-10-CM | POA: Diagnosis not present

## 2019-09-06 MED FILL — DIMETHYL FUMARATE 240 MG CP: 240 | 30 days supply | Qty: 60 | Fill #4

## 2019-10-06 MED FILL — DIMETHYL FUMARATE 240 MG CP: 240 | 30 days supply | Qty: 60 | Fill #5

## 2019-11-02 MED FILL — DIMETHYL FUMARATE 240 MG CP: 240 | 30 days supply | Qty: 60 | Fill #6

## 2019-11-11 ENCOUNTER — Ambulatory Visit: Payer: 59 | Admitting: Certified Nurse Midwife

## 2019-12-01 MED FILL — DIMETHYL FUMARATE 240 MG CP: 240 | 30 days supply | Qty: 60 | Fill #7

## 2019-12-29 MED FILL — DIMETHYL FUMARATE 240 MG CP: 240 | 30 days supply | Qty: 60 | Fill #8

## 2020-01-10 ENCOUNTER — Ambulatory Visit (INDEPENDENT_AMBULATORY_CARE_PROVIDER_SITE_OTHER): Payer: 59 | Admitting: Family Medicine

## 2020-01-10 ENCOUNTER — Encounter: Payer: Self-pay | Admitting: Family Medicine

## 2020-01-10 ENCOUNTER — Other Ambulatory Visit: Payer: Self-pay

## 2020-01-10 VITALS — BP 125/80 | HR 68 | Temp 98.2°F | Ht 67.0 in | Wt 183.4 lb

## 2020-01-10 DIAGNOSIS — G35 Multiple sclerosis: Secondary | ICD-10-CM

## 2020-01-10 DIAGNOSIS — Z13220 Encounter for screening for lipoid disorders: Secondary | ICD-10-CM | POA: Diagnosis not present

## 2020-01-10 DIAGNOSIS — R0789 Other chest pain: Secondary | ICD-10-CM

## 2020-01-10 LAB — COMPREHENSIVE METABOLIC PANEL
ALT: 10 U/L (ref 0–35)
AST: 11 U/L (ref 0–37)
Albumin: 4.3 g/dL (ref 3.5–5.2)
Alkaline Phosphatase: 63 U/L (ref 39–117)
BUN: 12 mg/dL (ref 6–23)
CO2: 30 mEq/L (ref 19–32)
Calcium: 9.1 mg/dL (ref 8.4–10.5)
Chloride: 103 mEq/L (ref 96–112)
Creatinine, Ser: 0.61 mg/dL (ref 0.40–1.20)
GFR: 103.14 mL/min (ref >60.00)
Glucose, Bld: 88 mg/dL (ref 70–99)
Potassium: 4.1 mEq/L (ref 3.5–5.1)
Sodium: 137 mEq/L (ref 135–145)
Total Bilirubin: 0.7 mg/dL (ref 0.2–1.2)
Total Protein: 6.6 g/dL (ref 6.0–8.3)

## 2020-01-10 LAB — CBC WITH DIFFERENTIAL/PLATELET
Basophils Absolute: 0 10*3/uL (ref 0.0–0.1)
Basophils Relative: 1.3 % (ref 0.0–3.0)
Eosinophils Absolute: 0.2 10*3/uL (ref 0.0–0.7)
Eosinophils Relative: 5 % (ref 0.0–5.0)
HCT: 40.1 % (ref 36.0–46.0)
Hemoglobin: 13.2 g/dL (ref 12.0–15.0)
Lymphocytes Relative: 30.8 % (ref 12.0–46.0)
Lymphs Abs: 1.1 10*3/uL (ref 0.7–4.0)
MCHC: 33 g/dL (ref 30.0–36.0)
MCV: 91.6 fl (ref 78.0–100.0)
Monocytes Absolute: 0.3 10*3/uL (ref 0.1–1.0)
Monocytes Relative: 9 % (ref 3.0–12.0)
Neutro Abs: 1.9 10*3/uL (ref 1.4–7.7)
Neutrophils Relative %: 53.9 % (ref 43.0–77.0)
Platelets: 236 10*3/uL (ref 150.0–400.0)
RBC: 4.38 Mil/uL (ref 3.87–5.11)
RDW: 14.4 % (ref 11.5–15.5)
WBC: 3.5 10*3/uL — ABNORMAL LOW (ref 4.0–10.5)

## 2020-01-10 LAB — LIPID PANEL
Cholesterol: 169 mg/dL (ref 0–200)
HDL: 61.1 mg/dL (ref >39.00)
LDL Cholesterol: 97 mg/dL (ref 0–99)
NonHDL: 107.92
Total CHOL/HDL Ratio: 3
Triglycerides: 55 mg/dL (ref 0.0–149.0)
VLDL: 11 mg/dL (ref 0.0–40.0)

## 2020-01-10 LAB — VITAMIN B12: Vitamin B-12: 515 pg/mL (ref 211–911)

## 2020-01-10 LAB — VITAMIN D 25 HYDROXY (VIT D DEFICIENCY, FRACTURES): VITD: 38.43 ng/mL (ref 30.00–100.00)

## 2020-01-10 NOTE — Assessment & Plan Note (Signed)
Labs ordered.

## 2020-01-10 NOTE — Assessment & Plan Note (Addendum)
I suspect this is musculoskeletal in nature based on exam and history.  EKG completed and is reassuring.Marland Kitchen  She will monitor and if not improving let us know.

## 2020-01-10 NOTE — Progress Notes (Signed)
Tommi Rumps, MD Phone: [phone]  Maria Jennings is a 49 y.o. female who presents today for same day visit.   Atypical chest pain: Patient notes left-sided chest pain up near her shoulder that started over the last several days.  She notices it mostly when she is laying down at night.  Notes there is a throbbing sensation.  Feels as though it superficial.  She did run into the car mirror on that side.  Rarely has a fluttering sensation in her chest that is been going on for at least a year.  There is some strain in her pectoralis on stretching.  No shortness of breath.  No diaphoresis.  No radiation.  She did have a COVID-19 vaccine 2 weeks ago.  She requests an EKG for reassurance.  MS: Patient is due for her 65-month lab work.  Continues to see neurology.  Social History   Tobacco Use  Smoking Status Never Smoker  Smokeless Tobacco Never Used     ROS see history of present illness  Objective  Physical Exam Vitals:   01/10/20 1235  BP: 125/80  Pulse: 68  Temp: 98.2 F (36.8 C)  SpO2: 99%    BP Readings from Last 3 Encounters:  01/10/20 125/80  11/05/18 110/68  01/15/18 120/84   Wt Readings from Last 3 Encounters:  01/10/20 183 lb 6.4 oz (83.2 kg)  05/03/19 174 lb (78.9 kg)  11/05/18 184 lb (83.5 kg)    Physical Exam Constitutional:      General: She is not in acute distress.    Appearance: She is not diaphoretic.  Cardiovascular:     Rate and Rhythm: Normal rate and regular rhythm.     Heart sounds: Normal heart sounds.  Pulmonary:     Effort: Pulmonary effort is normal.     Breath sounds: Normal breath sounds.  Chest:    Skin:    General: Skin is warm and dry.  Neurological:     Mental Status: She is alert.    EKG: Sinus bradycardia, rate 55, no ST or T wave changes   Assessment/Plan: Please see individual problem list.  Atypical chest pain I suspect this is musculoskeletal in nature based on exam and history.  EKG completed and is  reassuring.Marland Kitchen  She will monitor and if not improving let us know.  Multiple sclerosis ([place]) Order labs as outlined below.  She will continue to see neurology.  Lipid screening Labs ordered.     Orders Placed This Encounter  Procedures  . CBC with Differential/Platelet  . B12  . Vitamin D (25 hydroxy)  . Lipid panel  . Comp Met (CMET)  . EKG 12-Lead    No orders of the defined types were placed in this encounter.   [place] was seen today for acute home visit.  Diagnoses and all orders for this visit:  Atypical chest pain -     EKG 12-Lead  Multiple sclerosis ([facility]) -     CBC with Differential/Platelet -     B12 -     Vitamin D (25 hydroxy)  Lipid screening -     Lipid panel -     Comp Met (CMET)     This visit occurred during the SARS-CoV-2 public health emergency.  Safety protocols were in place, including screening questions prior to the visit, additional usage of staff PPE, and extensive cleaning of exam room while observing appropriate contact time as indicated for disinfecting solutions.    Tommi Rumps, MD [facility] -  [employer]

## 2020-01-10 NOTE — Patient Instructions (Signed)
Nice to see you. I suspect you have strained a muscle in your chest.  If it is not improving please let us know. We will contact you with your lab results.

## 2020-01-10 NOTE — Assessment & Plan Note (Signed)
Order labs as outlined below.  She will continue to see neurology.

## 2020-01-16 ENCOUNTER — Other Ambulatory Visit: Payer: Self-pay | Admitting: Family Medicine

## 2020-01-16 DIAGNOSIS — D72819 Decreased white blood cell count, unspecified: Secondary | ICD-10-CM

## 2020-01-23 MED FILL — DIMETHYL FUMARATE 240 MG CP: 240 | 30 days supply | Qty: 60 | Fill #9

## 2020-01-26 ENCOUNTER — Other Ambulatory Visit: Payer: Self-pay | Admitting: Neurology

## 2020-01-26 DIAGNOSIS — G35 Multiple sclerosis: Secondary | ICD-10-CM

## 2020-02-01 NOTE — Progress Notes (Signed)
49 y.o. [id number] Married White or Caucasian Not Hispanic or Latino female here for annual exam. She needs her labs repeated that she had done with her PCP due to low white count.  Sexually active, no pain. No contraception.  Period Cycle (Days): 28 Period Duration (Days): 3-4 Period Pattern: Regular Menstrual Flow: Heavy, Light Menstrual Control: Tampon, Thin pad (Super) Menstrual Control Change Freq (Hours): 4 Dysmenorrhea: None   H/O MS is stable, no issues.   Patient's last menstrual period was 01/08/2020 (exact date).          Sexually active: Yes.    The current method of family planning is none.    Exercising: Yes.    Many outdoor activities.  Smoker:  no  Health Maintenance: Pap: 7/24/20wnl Hr HPV Neg.  2017 Neg Hr HPV neg  History of abnormal Pap:  no MMG:  07/22/19 density C Bi-rads 1 neg  BMD:   None  Colonoscopy: none  TDaP:  UTD per patient  Gardasil: NA   reports that she has never smoked. She has never used smokeless tobacco. She reports current alcohol use of about 5.0 standard drinks of alcohol per week. She reports that she does not use drugs. Daughters are almost 17 and the other almost 26. She is an Warden/ranger, works with [employer].   Past Medical History:  Diagnosis Date  . Lumbago   . Multiple sclerosis ([place])   . Sinusitis   . Vitamin D deficiency     Past Surgical History:  Procedure Laterality Date  . CESAREAN SECTION     x 1  . KNEE ARTHROSCOPY Right 07/2013    Current Outpatient Medications  Medication Sig Dispense Refill  . Cholecalciferol (VITAMIN D3) 5000 units CAPS Take 1 capsule by mouth 2 (two) times a week.    . cyanocobalamin 1000 MCG tablet Take 3,000 mcg by mouth 2 (two) times a week.     . Dimethyl Fumarate 240 MG CPDR Take 1 capsule (240 mg total) by mouth 2 (two) times daily. 60 capsule 9  . fexofenadine-pseudoephedrine (ALLEGRA-D 24) 180-240 MG 24 hr tablet Take by mouth as needed.     No current  facility-administered medications for this visit.    Family History  Problem Relation Age of Onset  . Hypertension Mother   . Hyperlipidemia Mother   . Hypertension Father   . Cancer Maternal Grandmother 52       colon  . Cancer Cousin 52       colon  . Breast cancer Neg Hx     Review of Systems  All other systems reviewed and are negative.   Exam:   BP 132/68   Pulse 68   Ht 5' 7.75" (1.721 m)   Wt 181 lb (82.1 kg)   LMP 01/08/2020 (Exact Date)   SpO2 100%   BMI 27.72 kg/m   Weight change: @WEIGHTCHANGE @ Height:   Height: 5' 7.75" (172.1 cm)  Ht Readings from Last 3 Encounters:  02/02/20 5' 7.75" (1.721 m)  01/10/20 5\' 7"  (1.702 m)  05/03/19 5\' 7"  (1.702 m)    General appearance: alert, cooperative and appears stated age Head: Normocephalic, without obvious abnormality, atraumatic Neck: no adenopathy, supple, symmetrical, trachea midline and thyroid normal to inspection and palpation Lungs: clear to auscultation bilaterally Cardiovascular: regular rate and rhythm Breasts: normal appearance, no masses or tenderness Abdomen: soft, non-tender; non distended,  no masses,  no organomegaly Extremities: extremities normal, atraumatic, no cyanosis or edema Skin:  Skin color, texture, turgor normal. No rashes or lesions Lymph nodes: Cervical, supraclavicular, and axillary nodes normal. No abnormal inguinal nodes palpated Neurologic: Grossly normal   Pelvic: External genitalia:  no lesions              Urethra:  normal appearing urethra with no masses, tenderness or lesions              Bartholins and Skenes: normal                 Vagina: normal appearing vagina with normal color and discharge, no lesions              Cervix: no lesions               Bimanual Exam:  Uterus:  normal size, contour, position, consistency, mobility, non-tender              Adnexa: no mass, fullness, tenderness               Rectovaginal: Confirms               Anus:  normal sphincter tone,  no lesions  Karmen Bongo chaperoned for the exam.  A:  Well Woman with normal exam  Low WBC with primary, requests repeat CBC today. Will send results to her primary  P:   No pap this year  Mammogram UTD  Discussed breast self exam  Discussed calcium and vit D intake  IFOB given

## 2020-02-02 ENCOUNTER — Encounter: Payer: Self-pay | Admitting: Obstetrics and Gynecology

## 2020-02-02 ENCOUNTER — Other Ambulatory Visit: Payer: Self-pay

## 2020-02-02 ENCOUNTER — Ambulatory Visit: Payer: 59 | Admitting: Obstetrics and Gynecology

## 2020-02-02 VITALS — BP 132/68 | HR 68 | Ht 67.75 in | Wt 181.0 lb

## 2020-02-02 DIAGNOSIS — D72818 Other decreased white blood cell count: Secondary | ICD-10-CM | POA: Diagnosis not present

## 2020-02-02 DIAGNOSIS — Z1211 Encounter for screening for malignant neoplasm of colon: Secondary | ICD-10-CM | POA: Diagnosis not present

## 2020-02-02 DIAGNOSIS — Z01419 Encounter for gynecological examination (general) (routine) without abnormal findings: Secondary | ICD-10-CM | POA: Diagnosis not present

## 2020-02-02 NOTE — Patient Instructions (Signed)
EXERCISE AND DIET:  We recommended that you start or continue a regular exercise program for good health. Regular exercise means any activity that makes your heart beat faster and makes you sweat.  We recommend exercising at least 30 minutes per day at least 3 days a week, preferably 4 or 5.  We also recommend a diet low in fat and sugar.  Inactivity, poor dietary choices and obesity can cause diabetes, heart attack, stroke, and kidney damage, among others.    ALCOHOL AND SMOKING:  Women should limit their alcohol intake to no more than 7 drinks/beers/glasses of wine (combined, not each!) per week. Moderation of alcohol intake to this level decreases your risk of breast cancer and liver damage. And of course, no recreational drugs are part of a healthy lifestyle.  And absolutely no smoking or even second hand smoke. Most people know smoking can cause heart and lung diseases, but did you know it also contributes to weakening of your bones? Aging of your skin?  Yellowing of your teeth and nails?  CALCIUM AND VITAMIN D:  Adequate intake of calcium and Vitamin D are recommended.  The recommendations for exact amounts of these supplements seem to change often, but generally speaking 1,000 mg of calcium (between diet and supplement) and 800 units of Vitamin D per day seems prudent. Certain women may benefit from higher intake of Vitamin D.  If you are among these women, your doctor will have told you during your visit.    PAP SMEARS:  Pap smears, to check for cervical cancer or precancers,  have traditionally been done yearly, although recent scientific advances have shown that most women can have pap smears less often.  However, every woman still should have a physical exam from her gynecologist every year. It will include a breast check, inspection of the vulva and vagina to check for abnormal growths or skin changes, a visual exam of the cervix, and then an exam to evaluate the size and shape of the uterus and  ovaries.  And after 51 years of age, a rectal exam is indicated to check for rectal cancers. We will also provide age appropriate advice regarding health maintenance, like when you should have certain vaccines, screening for sexually transmitted diseases, bone density testing, colonoscopy, mammograms, etc.   MAMMOGRAMS:  All women over 40 years old should have a yearly mammogram. Many facilities now offer a "3D" mammogram, which may cost around $50 extra out of pocket. If possible,  we recommend you accept the option to have the 3D mammogram performed.  It both reduces the number of women who will be called back for extra views which then turn out to be normal, and it is better than the routine mammogram at detecting truly abnormal areas.    COLON CANCER SCREENING: Now recommend starting at age 45. At this time colonoscopy is not covered for routine screening until 50. There are take home tests that can be done between 45-49.   COLONOSCOPY:  Colonoscopy to screen for colon cancer is recommended for all women at age 50.  We know, you hate the idea of the prep.  We agree, BUT, having colon cancer and not knowing it is worse!!  Colon cancer so often starts as a polyp that can be seen and removed at colonscopy, which can quite literally save your life!  And if your first colonoscopy is normal and you have no family history of colon cancer, most women don't have to have it again for   10 years.  Once every ten years, you can do something that may end up saving your life, right?  We will be happy to help you get it scheduled when you are ready.  Be sure to check your insurance coverage so you understand how much it will cost.  It may be covered as a preventative service at no cost, but you should check your particular policy.      Breast Self-Awareness Breast self-awareness means being familiar with how your breasts look and feel. It involves checking your breasts regularly and reporting any changes to your  health care provider. Practicing breast self-awareness is important. A change in your breasts can be a sign of a serious medical problem. Being familiar with how your breasts look and feel allows you to find any problems early, when treatment is more likely to be successful. All women should practice breast self-awareness, including women who have had breast implants. How to do a breast self-exam One way to learn what is normal for your breasts and whether your breasts are changing is to do a breast self-exam. To do a breast self-exam: Look for Changes  1. Remove all the clothing above your waist. 2. Stand in front of a mirror in a room with good lighting. 3. Put your hands on your hips. 4. Push your hands firmly downward. 5. Compare your breasts in the mirror. Look for differences between them (asymmetry), such as: ? Differences in shape. ? Differences in size. ? Puckers, dips, and bumps in one breast and not the other. 6. Look at each breast for changes in your skin, such as: ? Redness. ? Scaly areas. 7. Look for changes in your nipples, such as: ? Discharge. ? Bleeding. ? Dimpling. ? Redness. ? A change in position. Feel for Changes Carefully feel your breasts for lumps and changes. It is best to do this while lying on your back on the floor and again while sitting or standing in the shower or tub with soapy water on your skin. Feel each breast in the following way:  Place the arm on the side of the breast you are examining above your head.  Feel your breast with the other hand.  Start in the nipple area and make  inch (2 cm) overlapping circles to feel your breast. Use the pads of your three middle fingers to do this. Apply light pressure, then medium pressure, then firm pressure. The light pressure will allow you to feel the tissue closest to the skin. The medium pressure will allow you to feel the tissue that is a little deeper. The firm pressure will allow you to feel the tissue  close to the ribs.  Continue the overlapping circles, moving downward over the breast until you feel your ribs below your breast.  Move one finger-width toward the center of the body. Continue to use the  inch (2 cm) overlapping circles to feel your breast as you move slowly up toward your collarbone.  Continue the up and down exam using all three pressures until you reach your armpit.  Write Down What You Find  Write down what is normal for each breast and any changes that you find. Keep a written record with breast changes or normal findings for each breast. By writing this information down, you do not need to depend only on memory for size, tenderness, or location. Write down where you are in your menstrual cycle, if you are still menstruating. If you are having trouble noticing differences   in your breasts, do not get discouraged. With time you will become more familiar with the variations in your breasts and more comfortable with the exam. How often should I examine my breasts? Examine your breasts every month. If you are breastfeeding, the best time to examine your breasts is after a feeding or after using a breast pump. If you menstruate, the best time to examine your breasts is 5-7 days after your period is over. During your period, your breasts are lumpier, and it may be more difficult to notice changes. When should I see my health care provider? See your health care provider if you notice:  A change in shape or size of your breasts or nipples.  A change in the skin of your breast or nipples, such as a reddened or scaly area.  Unusual discharge from your nipples.  A lump or thick area that was not there before.  Pain in your breasts.  Anything that concerns you.   Perimenopause  Perimenopause is the normal time of life before and after menstrual periods stop completely (menopause). Perimenopause can begin 2-8 years before menopause, and it usually lasts for 1 year after  menopause. During perimenopause, the ovaries may or may not produce an egg. What are the causes? This condition is caused by a natural change in hormone levels that happens as you get older. What increases the risk? This condition is more likely to start at an earlier age if you have certain medical conditions or treatments, including:  A tumor of the pituitary gland in the brain.  A disease that affects the ovaries and hormone production.  Radiation treatment for cancer.  Certain cancer treatments, such as chemotherapy or hormone (anti-estrogen) therapy.  Heavy smoking and excessive alcohol use.  Family history of early menopause. What are the signs or symptoms? Perimenopausal changes affect each woman differently. Symptoms of this condition may include:  Hot flashes.  Night sweats.  Irregular menstrual periods.  Decreased sex drive.  Vaginal dryness.  Headaches.  Mood swings.  Depression.  Memory problems or trouble concentrating.  Irritability.  Tiredness.  Weight gain.  Anxiety.  Trouble getting pregnant. How is this diagnosed? This condition is diagnosed based on your medical history, a physical exam, your age, your menstrual history, and your symptoms. Hormone tests may also be done. How is this treated? In some cases, no treatment is needed. You and your health care provider should make a decision together about whether treatment is necessary. Treatment will be based on your individual condition and preferences. Various treatments are available, such as:  Menopausal hormone therapy (MHT).  Medicines to treat specific symptoms.  Acupuncture.  Vitamin or herbal supplements. Before starting treatment, make sure to let your health care provider know if you have a personal or family history of:  Heart disease.  Breast cancer.  Blood clots.  Diabetes.  Osteoporosis. Follow these instructions at home: Lifestyle  Do not use any products that  contain nicotine or tobacco, such as cigarettes and e-cigarettes. If you need help quitting, ask your health care provider.  Eat a balanced diet that includes fresh fruits and vegetables, whole grains, soybeans, eggs, lean meat, and low-fat dairy.  Get at least 30 minutes of physical activity on 5 or more days each week.  Avoid alcoholic and caffeinated beverages, as well as spicy foods. This may help prevent hot flashes.  Get 7-8 hours of sleep each night.  Dress in layers that can be removed to help you manage hot   flashes.  Find ways to manage stress, such as deep breathing, meditation, or journaling. General instructions  Keep track of your menstrual periods, including: ? When they occur. ? How heavy they are and how long they last. ? How much time passes between periods.  Keep track of your symptoms, noting when they start, how often you have them, and how long they last.  Take over-the-counter and prescription medicines only as told by your health care provider.  Take vitamin supplements only as told by your health care provider. These may include calcium, vitamin E, and vitamin D.  Use vaginal lubricants or moisturizers to help with vaginal dryness and improve comfort during sex.  Talk with your health care provider before starting any herbal supplements.  Keep all follow-up visits as told by your health care provider. This is important. This includes any group therapy or counseling. Contact a health care provider if:  You have heavy vaginal bleeding or pass blood clots.  Your period lasts more than 2 days longer than normal.  Your periods are recurring sooner than 21 days.  You bleed after having sex. Get help right away if:  You have chest pain, trouble breathing, or trouble talking.  You have severe depression.  You have pain when you urinate.  You have severe headaches.  You have vision problems. Summary  Perimenopause is the time when a woman's body  begins to move into menopause. This may happen naturally or as a result of other health problems or medical treatments.  Perimenopause can begin 2-8 years before menopause, and it usually lasts for 1 year after menopause.  Perimenopausal symptoms can be managed through medicines, lifestyle changes, and complementary therapies such as acupuncture. This information is not intended to replace advice given to you by your health care provider. Make sure you discuss any questions you have with your health care provider. Document Revised: 03/13/2017 Document Reviewed: 05/06/2016 Elsevier Patient Education  2020 [employer]  

## 2020-02-07 ENCOUNTER — Other Ambulatory Visit: Payer: Self-pay

## 2020-02-07 ENCOUNTER — Other Ambulatory Visit: Payer: Self-pay | Admitting: *Deleted

## 2020-02-07 ENCOUNTER — Other Ambulatory Visit (INDEPENDENT_AMBULATORY_CARE_PROVIDER_SITE_OTHER): Payer: 59

## 2020-02-07 DIAGNOSIS — D72818 Other decreased white blood cell count: Secondary | ICD-10-CM

## 2020-02-07 LAB — CBC WITH DIFFERENTIAL/PLATELET

## 2020-02-08 LAB — CBC WITH DIFFERENTIAL/PLATELET
Basophils Absolute: 0.1 10*3/uL (ref 0.0–0.2)
Basos: 1 %
EOS (ABSOLUTE): 0.3 10*3/uL (ref 0.0–0.4)
Eos: 7 %
Hematocrit: 43.3 % (ref 34.0–46.6)
Hemoglobin: 14.6 g/dL (ref 11.1–15.9)
Immature Grans (Abs): 0 10*3/uL (ref 0.0–0.1)
Immature Granulocytes: 0 %
Lymphocytes Absolute: 1.3 10*3/uL (ref 0.7–3.1)
Lymphs: 31 %
MCH: 30.7 pg (ref 26.6–33.0)
MCHC: 33.7 g/dL (ref 31.5–35.7)
MCV: 91 fL (ref 79–97)
Monocytes Absolute: 0.4 10*3/uL (ref 0.1–0.9)
Monocytes: 8 %
Neutrophils Absolute: 2.3 10*3/uL (ref 1.4–7.0)
Neutrophils: 53 %
Platelets: 221 10*3/uL (ref 150–450)
RBC: 4.75 x10E6/uL (ref 3.77–5.28)
RDW: 14 % (ref 11.7–15.4)
WBC: 4.3 10*3/uL (ref 3.4–10.8)

## 2020-02-16 ENCOUNTER — Other Ambulatory Visit: Payer: Self-pay | Admitting: Pharmacist

## 2020-02-16 MED ORDER — DIMETHYL FUMARATE 240 MG PO CPDR: 240.0000 mg | DELAYED_RELEASE_CAPSULE | Freq: Two times a day (BID) | ORAL | 0 refills | Status: DC

## 2020-02-16 MED FILL — DIMETHYL FUMARATE 240 MG CP: 240 | 30 days supply | Qty: 60 | Fill #0

## 2020-02-17 ENCOUNTER — Other Ambulatory Visit: Payer: Self-pay

## 2020-02-17 ENCOUNTER — Ambulatory Visit
Admission: RE | Admit: 2020-02-17 | Discharge: 2020-02-17 | Disposition: A | Discharge status: Home / Self Care | Payer: 59 | Source: Ambulatory Visit | Attending: Neurology | Admitting: Neurology

## 2020-02-17 DIAGNOSIS — G35 Multiple sclerosis: Secondary | ICD-10-CM

## 2020-02-17 DIAGNOSIS — M50323 Other cervical disc degeneration at C6-C7 level: Secondary | ICD-10-CM | POA: Diagnosis not present

## 2020-02-17 DIAGNOSIS — G959 Disease of spinal cord, unspecified: Secondary | ICD-10-CM | POA: Diagnosis not present

## 2020-02-17 DIAGNOSIS — G9389 Other specified disorders of brain: Secondary | ICD-10-CM | POA: Diagnosis not present

## 2020-02-17 DIAGNOSIS — M4802 Spinal stenosis, cervical region: Secondary | ICD-10-CM | POA: Diagnosis not present

## 2020-02-17 DIAGNOSIS — R9082 White matter disease, unspecified: Secondary | ICD-10-CM | POA: Diagnosis not present

## 2020-02-17 MED ORDER — GADOBUTROL 1 MMOL/ML IV SOLN
8.0000 mL | Freq: Once | INTRAVENOUS | Status: AC | PRN
  Administered 2020-02-17: 8 mL via INTRAVENOUS

## 2020-03-12 ENCOUNTER — Other Ambulatory Visit: Payer: Self-pay | Admitting: Pharmacist

## 2020-03-12 MED ORDER — DIMETHYL FUMARATE 240 MG PO CPDR: 240.0000 mg | DELAYED_RELEASE_CAPSULE | Freq: Two times a day (BID) | ORAL | 1 refills | Status: DC

## 2020-03-13 MED FILL — DIMETHYL FUMARATE 240 MG CP: 240 | 30 days supply | Qty: 60 | Fill #0

## 2020-03-14 DIAGNOSIS — E559 Vitamin D deficiency, unspecified: Secondary | ICD-10-CM | POA: Diagnosis not present

## 2020-03-14 DIAGNOSIS — G35 Multiple sclerosis: Secondary | ICD-10-CM | POA: Diagnosis not present

## 2020-03-14 DIAGNOSIS — Z79899 Other long term (current) drug therapy: Secondary | ICD-10-CM | POA: Diagnosis not present

## 2020-03-14 DIAGNOSIS — E538 Deficiency of other specified B group vitamins: Secondary | ICD-10-CM | POA: Diagnosis not present

## 2020-04-10 MED FILL — DIMETHYL FUMARATE 240 MG CP: 240 | 30 days supply | Qty: 60 | Fill #1

## 2020-04-26 ENCOUNTER — Other Ambulatory Visit: Payer: Self-pay | Admitting: Internal Medicine

## 2020-04-27 ENCOUNTER — Other Ambulatory Visit: Payer: Self-pay

## 2020-04-27 ENCOUNTER — Other Ambulatory Visit: Payer: Self-pay | Admitting: Internal Medicine

## 2020-04-27 ENCOUNTER — Ambulatory Visit (HOSPITAL_BASED_OUTPATIENT_CLINIC_OR_DEPARTMENT_OTHER): Payer: 59 | Admitting: Pharmacist

## 2020-04-27 DIAGNOSIS — Z79899 Other long term (current) drug therapy: Secondary | ICD-10-CM

## 2020-04-27 MED ORDER — DIMETHYL FUMARATE 240 MG PO CPDR: 240.0000 mg | DELAYED_RELEASE_CAPSULE | Freq: Two times a day (BID) | ORAL | 1 refills | Status: DC

## 2020-04-27 NOTE — Telephone Encounter (Signed)
This medication needs to be filled by her neurologist.  Please call her and let her know to contact her neurologist for the refill.

## 2020-04-27 NOTE — Progress Notes (Signed)
S: Patient presents today for review of their specialty medication.   Patient is currently takingTecfidera(dimethyl fumarate) forMS. Patient is managed byDr. Clifton Custard this.  Adherence:confirms  Efficacy:pt is happy with his results  Dosing:240 mg BID  Renal adjustment: no adjustment necessary  Hepatic adjustment: no adjustment necessary  Dose adjustment for toxicity: Flushing, GI intolerance, or intolerance to maintenance dose: Consider temporary dose reduction to 120 mg twice daily (resume recommended maintenance dose of 240 mg twice daily within 4 weeks). Consider discontinuation in patients who cannot tolerate return to the maintenance dose.  Hepatic injury (suspected drug-induced), clinically significant: Discontinue treatment.  Lymphocyte count <500/mm3 persisting for >6 months: Consider treatment interruption.  Serious infection: Consider withholding treatment until infection resolves.  Current adverse effects: Flushing, skin rash, or puritus: none S/sx of infection:none GI upset:none S/sx ofheptotoxicity:none  O: Labs followed by patient's PCP. WNL per pt.  BMP Latest Ref Rng & Units 01/10/2020 11/05/2018 01/20/2018  Glucose 70 - 99 mg/dL 88 80 93  BUN 6 - 23 mg/dL 12 11 11   Creatinine 0.40 - 1.20 mg/dL 0.61 0.59 0.58  BUN/Creat Ratio 9 - 23 - 19 -  Sodium 135 - 145 mEq/L 137 139 138  Potassium 3.5 - 5.1 mEq/L 4.1 4.2 3.8  Chloride 96 - 112 mEq/L 103 102 101  CO2 19 - 32 mEq/L 30 23 27   Calcium 8.4 - 10.5 mg/dL 9.1 9.1 9.4   CBC    Component Value Date/Time   WBC 4.3 02/07/2020 0915   WBC 3.5 (L) 01/10/2020 1305   RBC 4.75 02/07/2020 0915   RBC 4.38 01/10/2020 1305   HGB 14.6 02/07/2020 0915   HCT 43.3 02/07/2020 0915   PLT 221 02/07/2020 0915   MCV 91 02/07/2020 0915   MCV 90 12/15/2013 0752   MCH 30.7 02/07/2020 0915   MCH 30.8 05/11/2019 0919   MCHC 33.7 02/07/2020 0915   MCHC 33.0 01/10/2020 1305   RDW 14.0 02/07/2020  0915   RDW 15.8 (H) 12/15/2013 0752   LYMPHSABS 1.3 02/07/2020 0915   LYMPHSABS 1.2 12/15/2013 0752   MONOABS 0.3 01/10/2020 1305   MONOABS 0.5 12/15/2013 0752   EOSABS 0.3 02/07/2020 0915   EOSABS 0.2 12/15/2013 0752   BASOSABS 0.1 02/07/2020 0915   BASOSABS 0.0 12/15/2013 0752    A/P: 1. Medication review: Patient is currently onTecfideraforMSand is tolerating it well. Reviewed the medication with the patient, including the following:dimethyl fumarate activates the Nrf2 pathway, which is believed to result in anti-inflammatory and cytoprotective properties.Themedication is oral and should be swallowed whole. Administering with a high-fat, high-protein meal may decrease flushing and GI side effects.Possible adverse effects include skin flushing, pruritus, GI upset, albuminura, infection, lymphocytopenia, and increased liver transaminases.Cases of PML have been reported with severe, long-standing lymphopenia identified as the primary risk for PML. Dose adjustments for toxicities have been summarized above.No recommendations for any changes at this time.  Benard Halsted, PharmD, [place], [place] [phone]

## 2020-05-22 MED FILL — DIMETHYL FUMARATE 240 MG CP: 240 | 30 days supply | Qty: 60 | Fill #0

## 2020-05-25 ENCOUNTER — Encounter: Payer: Self-pay | Admitting: Internal Medicine

## 2020-05-25 ENCOUNTER — Ambulatory Visit: Payer: 59 | Admitting: Internal Medicine

## 2020-05-25 ENCOUNTER — Other Ambulatory Visit: Payer: Self-pay

## 2020-05-25 ENCOUNTER — Other Ambulatory Visit: Payer: Self-pay | Admitting: Internal Medicine

## 2020-05-25 VITALS — BP 126/86 | HR 86 | Temp 97.6°F | Ht 67.75 in | Wt 185.2 lb

## 2020-05-25 DIAGNOSIS — G35 Multiple sclerosis: Secondary | ICD-10-CM

## 2020-05-25 DIAGNOSIS — H6123 Impacted cerumen, bilateral: Secondary | ICD-10-CM

## 2020-05-25 DIAGNOSIS — H469 Unspecified optic neuritis: Secondary | ICD-10-CM

## 2020-05-25 DIAGNOSIS — H938X3 Other specified disorders of ear, bilateral: Secondary | ICD-10-CM

## 2020-05-25 DIAGNOSIS — H02821 Cysts of right upper eyelid: Secondary | ICD-10-CM

## 2020-05-25 MED ORDER — NEOMYCIN-POLYMYXIN-HC 3.5-10000-1 OT SOLN: 3.0000 [drp] | Freq: Three times a day (TID) | OTIC | 0 refills | Status: DC

## 2020-05-25 NOTE — Patient Instructions (Addendum)
Mammogram due 07/21/20  Pap due 11/04/2021   Consider Debrox ear wax drops to prevent ear wax build up use for 7 days consistently 1x per day    Earwax Buildup, Adult The ears produce a substance called earwax that helps keep bacteria out of the ear and protects the skin in the ear canal. Occasionally, earwax can build up in the ear and cause discomfort or hearing loss. What are the causes? This condition is caused by a buildup of earwax. Ear canals are self-cleaning. Ear wax is made in the outer part of the ear canal and generally falls out in small amounts over time. When the self-cleaning mechanism is not working, earwax builds up and can cause decreased hearing and discomfort. Attempting to clean ears with cotton swabs can push the earwax deep into the ear canal and cause decreased hearing and pain. What increases the risk? This condition is more likely to develop in people who:  Clean their ears often with cotton swabs.  Pick at their ears.  Use earplugs or in-ear headphones often, or wear hearing aids. The following factors may also make you more likely to develop this condition:  Being female.  Being of older age.  Naturally producing more earwax.  Having narrow ear canals.  Having earwax that is overly thick or sticky.  Having excess hair in the ear canal.  Having eczema.  Being dehydrated. What are the signs or symptoms? Symptoms of this condition include:  Reduced or muffled hearing.  A feeling of fullness in the ear or feeling that the ear is plugged.  Fluid coming from the ear.  Ear pain or an itchy ear.  Ringing in the ear.  Coughing.  Balance problems.  An obvious piece of earwax that can be seen inside the ear canal. How is this diagnosed? This condition may be diagnosed based on:  Your symptoms.  Your medical history.  An ear exam. During the exam, your health care provider will look into your ear with an instrument called an otoscope. You may  have tests, including a hearing test. How is this treated? This condition may be treated by:  Using ear drops to soften the earwax.  Having the earwax removed by a health care provider. The health care provider may: ? Flush the ear with water. ? Use an instrument that has a loop on the end (curette). ? Use a suction device.  Having surgery to remove the wax buildup. This may be done in severe cases. Follow these instructions at home:  Take over-the-counter and prescription medicines only as told by your health care provider.  Do not put any objects, including cotton swabs, into your ear. You can clean the opening of your ear canal with a washcloth or facial tissue.  Follow instructions from your health care provider about cleaning your ears. Do not overclean your ears.  Drink enough fluid to keep your urine pale yellow. This will help to thin the earwax.  Keep all follow-up visits as told. If earwax builds up in your ears often or if you use hearing aids, consider seeing your health care provider for routine, preventive ear cleanings. Ask your health care provider how often you should schedule your cleanings.  If you have hearing aids, clean them according to instructions from the manufacturer and your health care provider.   Contact a health care provider if:  You have ear pain.  You develop a fever.  You have pus or other fluid coming from your ear.  You have hearing loss.  You have ringing in your ears that does not go away.  You feel like the room is spinning (vertigo).  Your symptoms do not improve with treatment. Get help right away if:  You have bleeding from the affected ear.  You have severe ear pain. Summary  Earwax can build up in the ear and cause discomfort or hearing loss.  The most common symptoms of this condition include reduced or muffled hearing, a feeling of fullness in the ear, or feeling that the ear is plugged.  This condition may be diagnosed  based on your symptoms, your medical history, and an ear exam.  This condition may be treated by using ear drops to soften the earwax or by having the earwax removed by a health care provider.  Do not put any objects, including cotton swabs, into your ear. You can clean the opening of your ear canal with a washcloth or facial tissue. This information is not intended to replace advice given to you by your health care provider. Make sure you discuss any questions you have with your health care provider. Document Revised: 07/19/2019 Document Reviewed: 07/19/2019 Elsevier Patient Education  [place].

## 2020-05-25 NOTE — Progress Notes (Signed)
Chief Complaint  Patient presents with  . Ear Pain  . Cerumen Impaction   F/u  B/l ear pain left >right h/o ear irrigation 4x in the last 20 years wax pain 2/10 left ear   C/o right upper eyelid cyst x 3-4 weeks and h/o MS optic neuritis will refer [place] [facility]    Review of Systems  Constitutional: Negative for weight loss.  HENT: Positive for ear pain and hearing loss.   Eyes: Negative for blurred vision.       +right eyelid cyst   Respiratory: Negative for shortness of breath.    Past Medical History:  Diagnosis Date  . Lumbago   . Multiple sclerosis ([place])   . Sinusitis   . Vitamin D deficiency    Past Surgical History:  Procedure Laterality Date  . CESAREAN SECTION     x 1  . KNEE ARTHROSCOPY Right 07/2013   Family History  Problem Relation Age of Onset  . Hypertension Mother   . Hyperlipidemia Mother   . Hypertension Father   . Cancer Maternal Grandmother 69       colon  . Cancer Cousin 52       colon  . Breast cancer Neg Hx    Social History   Socioeconomic History  . Marital status: Married    Spouse name: Not on file  . Number of children: 2  . Years of education: Not on file  . Highest education level: Not on file  Occupational History    Employer: [employer]  Tobacco Use  . Smoking status: Never Smoker  . Smokeless tobacco: Never Used  Substance and Sexual Activity  . Alcohol use: Yes    Alcohol/week: 5.0 standard drinks    Types: 5 Glasses of wine per week  . Drug use: No  . Sexual activity: Yes    Partners: Male    Birth control/protection: None  Other Topics Concern  . Not on file  Social History Narrative   Lives in [place].      Work - rehab [employer]   Social Determinants of Radio broadcast assistant Strain: Not on [employer] Insecurity: Not on file  Transportation Needs: Not on file  Physical Activity: Not on file  Stress: Not on file  Social Connections: Not on file  Intimate Partner Violence: Not on file   Current Meds   Medication Sig  . Cholecalciferol (VITAMIN D3) 5000 units CAPS Take 1 capsule by mouth 2 (two) times a week.  . cyanocobalamin 1000 MCG tablet Take 3,000 mcg by mouth 2 (two) times a week.   . Dimethyl Fumarate 240 MG CPDR Take 1 capsule (240 mg total) by mouth 2 (two) times daily.  Marland Kitchen neomycin-polymyxin-hydrocortisone (CORTISPORIN) OTIC solution Place 3 drops into both ears 3 (three) times daily. X4-7 days   Allergies  Allergen Reactions  . Amoxicillin Other (See Comments)  . Azithromycin Hives  . Ciprofloxacin Hcl Rash   No results found for this or any previous visit (from the past 2160 hour(s)). Objective  Body mass index is 28.37 kg/m. Wt Readings from Last 3 Encounters:  05/25/20 185 lb 3.2 oz (84 kg)  02/02/20 181 lb (82.1 kg)  01/10/20 183 lb 6.4 oz (83.2 kg)   Temp Readings from Last 3 Encounters:  05/25/20 97.6 F (36.4 C) (Oral)  01/10/20 98.2 F (36.8 C) (Oral)  11/05/18 (!) 97.4 F (36.3 C) (Skin)   BP Readings from Last 3 Encounters:  05/25/20 126/86  02/02/20 132/68  01/10/20 125/80   Pulse Readings from Last 3 Encounters:  05/25/20 86  02/02/20 68  01/10/20 68    Physical Exam Vitals and nursing note reviewed.  Constitutional:      Appearance: Normal appearance. She is well-developed and well-groomed.  HENT:     Head: Normocephalic and atraumatic.     Right Ear: There is impacted cerumen.     Left Ear: There is impacted cerumen.  Cardiovascular:     Rate and Rhythm: Normal rate.  Pulmonary:     Effort: Pulmonary effort is normal.     Breath sounds: Normal breath sounds.  Skin:    General: Skin is warm and dry.  Neurological:     General: No focal deficit present.     Mental Status: She is alert and oriented to person, place, and time. Mental status is at baseline.     Gait: Gait normal.  Psychiatric:        Attention and Perception: Attention and perception normal.        Mood and Affect: Mood and affect normal.        Speech: Speech  normal.        Behavior: Behavior normal. Behavior is cooperative.        Thought Content: Thought content normal.        Cognition and Memory: Cognition and memory normal.        Judgment: Judgment normal.     Assessment  Plan  Bilateral impacted cerumen see HPI Irritation of ear, bilateral - Plan: neomycin-polymyxin-hydrocortisone (CORTISPORIN) OTIC solution -pt consented and tolerated b/l ear lavage with currette to remove excess wax b/l ears all wax removed   Cyst of right upper eyelid - Plan: Ambulatory referral to Ophthalmology [place] [facility]  Multiple sclerosis ([place]) - Plan: Ambulatory referral to Ophthalmology H/o Optic neuritis - Plan: Ambulatory referral to Ophthalmology   Provider: Dr. Olivia Mackie McLean-Scocuzza-Internal Medicine

## 2020-05-29 ENCOUNTER — Telehealth: Payer: Self-pay | Admitting: Family Medicine

## 2020-05-29 DIAGNOSIS — G35 Multiple sclerosis: Secondary | ICD-10-CM

## 2020-05-29 DIAGNOSIS — Z1231 Encounter for screening mammogram for malignant neoplasm of breast: Secondary | ICD-10-CM

## 2020-05-29 NOTE — Telephone Encounter (Signed)
Orders placed for labs. She will be due at the end of April. Please get her scheduled. Also she can call to schedule her mammogram. It has been ordered.

## 2020-05-29 NOTE — Telephone Encounter (Signed)
-----   Message from Delorise Jackson, MD sent at 05/25/2020  4:29 PM EST ----- Can you place labwork for pt for patient end of 07/2020 and mammogram due 07/21/20 [place]  She will f/u after this   Thank you

## 2020-05-30 NOTE — Telephone Encounter (Signed)
I called the patient and scheduled a lab appointment at the end of April and I informed her to call and schedule her mammogram.  Nina,cma

## 2020-06-11 DIAGNOSIS — H02821 Cysts of right upper eyelid: Secondary | ICD-10-CM | POA: Diagnosis not present

## 2020-06-19 MED FILL — DIMETHYL FUMARATE 240 MG CP: 240 | 30 days supply | Qty: 60 | Fill #1

## 2020-07-10 ENCOUNTER — Other Ambulatory Visit (HOSPITAL_COMMUNITY): Payer: Self-pay

## 2020-07-12 ENCOUNTER — Other Ambulatory Visit: Payer: Self-pay | Admitting: Internal Medicine

## 2020-07-13 NOTE — Telephone Encounter (Signed)
Please advise to refill Historical provider.

## 2020-07-13 NOTE — Telephone Encounter (Signed)
This medication needs to come from her neurologist.

## 2020-07-16 ENCOUNTER — Other Ambulatory Visit (HOSPITAL_COMMUNITY): Payer: Self-pay

## 2020-07-17 ENCOUNTER — Other Ambulatory Visit (HOSPITAL_COMMUNITY): Payer: Self-pay

## 2020-07-17 ENCOUNTER — Other Ambulatory Visit: Payer: Self-pay | Admitting: Pharmacist

## 2020-07-17 MED ORDER — DIMETHYL FUMARATE 240 MG PO CPDR
240.0000 mg | DELAYED_RELEASE_CAPSULE | Freq: Two times a day (BID) | ORAL | 1 refills | Status: DC
  Filled 2020-07-17: qty 60, 30d supply, fill #0
  Filled 2020-08-10: qty 60, 30d supply, fill #1

## 2020-07-17 MED ORDER — DIMETHYL FUMARATE 240 MG PO CPDR
240.0000 mg | DELAYED_RELEASE_CAPSULE | Freq: Two times a day (BID) | ORAL | 1 refills | Status: DC
  Filled 2020-07-17: qty 60, 30d supply, fill #0

## 2020-08-08 ENCOUNTER — Other Ambulatory Visit: Payer: Self-pay

## 2020-08-08 ENCOUNTER — Other Ambulatory Visit (INDEPENDENT_AMBULATORY_CARE_PROVIDER_SITE_OTHER): Payer: 59

## 2020-08-08 ENCOUNTER — Other Ambulatory Visit (HOSPITAL_COMMUNITY): Payer: Self-pay

## 2020-08-08 DIAGNOSIS — G35 Multiple sclerosis: Secondary | ICD-10-CM

## 2020-08-08 LAB — VITAMIN B12: Vitamin B-12: 759 pg/mL (ref 211–911)

## 2020-08-08 LAB — CBC WITH DIFFERENTIAL/PLATELET
Basophils Absolute: 0.1 10*3/uL (ref 0.0–0.1)
Basophils Relative: 1.2 % (ref 0.0–3.0)
Eosinophils Absolute: 0.4 10*3/uL (ref 0.0–0.7)
Eosinophils Relative: 9 % — ABNORMAL HIGH (ref 0.0–5.0)
HCT: 42.2 % (ref 36.0–46.0)
Hemoglobin: 14.3 g/dL (ref 12.0–15.0)
Lymphocytes Relative: 26.3 % (ref 12.0–46.0)
Lymphs Abs: 1.2 10*3/uL (ref 0.7–4.0)
MCHC: 34 g/dL (ref 30.0–36.0)
MCV: 94.3 fl (ref 78.0–100.0)
Monocytes Absolute: 0.4 10*3/uL (ref 0.1–1.0)
Monocytes Relative: 9.7 % (ref 3.0–12.0)
Neutro Abs: 2.4 10*3/uL (ref 1.4–7.7)
Neutrophils Relative %: 53.8 % (ref 43.0–77.0)
Platelets: 191 10*3/uL (ref 150.0–400.0)
RBC: 4.47 Mil/uL (ref 3.87–5.11)
RDW: 12.6 % (ref 11.5–15.5)
WBC: 4.4 10*3/uL (ref 4.0–10.5)

## 2020-08-08 LAB — VITAMIN D 25 HYDROXY (VIT D DEFICIENCY, FRACTURES): VITD: 32.38 ng/mL (ref 30.00–100.00)

## 2020-08-10 ENCOUNTER — Other Ambulatory Visit (HOSPITAL_COMMUNITY): Payer: Self-pay

## 2020-08-13 ENCOUNTER — Other Ambulatory Visit (HOSPITAL_COMMUNITY): Payer: Self-pay

## 2020-08-15 ENCOUNTER — Other Ambulatory Visit (HOSPITAL_COMMUNITY): Payer: Self-pay

## 2020-09-05 ENCOUNTER — Other Ambulatory Visit (HOSPITAL_COMMUNITY): Payer: Self-pay

## 2020-09-06 ENCOUNTER — Other Ambulatory Visit (HOSPITAL_COMMUNITY): Payer: Self-pay

## 2020-09-07 ENCOUNTER — Other Ambulatory Visit: Payer: Self-pay | Admitting: Pharmacist

## 2020-09-07 ENCOUNTER — Other Ambulatory Visit (HOSPITAL_COMMUNITY): Payer: Self-pay

## 2020-09-07 MED ORDER — DIMETHYL FUMARATE 240 MG PO CPDR
DELAYED_RELEASE_CAPSULE | ORAL | 3 refills | Status: DC
  Filled 2020-09-07 (×2): qty 60, 30d supply, fill #0
  Filled 2020-10-03 – 2020-10-08 (×3): qty 60, 30d supply, fill #1
  Filled 2020-10-29 – 2020-10-31 (×2): qty 60, 30d supply, fill #2
  Filled 2020-12-04: qty 60, 30d supply, fill #3

## 2020-09-07 MED ORDER — DIMETHYL FUMARATE 240 MG PO CPDR: DELAYED_RELEASE_CAPSULE | ORAL | 3 refills | Status: DC

## 2020-09-11 ENCOUNTER — Other Ambulatory Visit (HOSPITAL_COMMUNITY): Payer: Self-pay

## 2020-10-03 ENCOUNTER — Other Ambulatory Visit (HOSPITAL_COMMUNITY): Payer: Self-pay

## 2020-10-04 ENCOUNTER — Other Ambulatory Visit (HOSPITAL_COMMUNITY): Payer: Self-pay

## 2020-10-08 ENCOUNTER — Other Ambulatory Visit (HOSPITAL_COMMUNITY): Payer: Self-pay

## 2020-10-09 ENCOUNTER — Other Ambulatory Visit (HOSPITAL_COMMUNITY): Payer: Self-pay

## 2020-10-25 ENCOUNTER — Other Ambulatory Visit (HOSPITAL_BASED_OUTPATIENT_CLINIC_OR_DEPARTMENT_OTHER): Payer: Self-pay

## 2020-10-29 ENCOUNTER — Other Ambulatory Visit (HOSPITAL_COMMUNITY): Payer: Self-pay

## 2020-10-31 ENCOUNTER — Other Ambulatory Visit (HOSPITAL_COMMUNITY): Payer: Self-pay

## 2020-11-05 ENCOUNTER — Other Ambulatory Visit (HOSPITAL_COMMUNITY): Payer: Self-pay

## 2020-11-08 ENCOUNTER — Other Ambulatory Visit: Payer: Self-pay

## 2020-11-08 ENCOUNTER — Ambulatory Visit
Admission: EM | Admit: 2020-11-08 | Discharge: 2020-11-08 | Disposition: A | Discharge status: Home / Self Care | Payer: 59 | Attending: Emergency Medicine | Admitting: Emergency Medicine

## 2020-11-08 ENCOUNTER — Encounter: Payer: Self-pay | Admitting: Emergency Medicine

## 2020-11-08 DIAGNOSIS — B9689 Other specified bacterial agents as the cause of diseases classified elsewhere: Secondary | ICD-10-CM

## 2020-11-08 DIAGNOSIS — J329 Chronic sinusitis, unspecified: Secondary | ICD-10-CM | POA: Diagnosis not present

## 2020-11-08 MED ORDER — CEFDINIR 300 MG PO CAPS
300.0000 mg | ORAL_CAPSULE | Freq: Two times a day (BID) | ORAL | 0 refills | Status: AC
  Filled 2020-11-08: qty 14, 7d supply, fill #0

## 2020-11-08 MED ORDER — AZELASTINE HCL 0.1 % NA SOLN
2.0000 | Freq: Two times a day (BID) | NASAL | 0 refills | Status: DC
  Filled 2020-11-08: qty 30, 30d supply, fill #0

## 2020-11-08 NOTE — Discharge Instructions (Addendum)
Sinusitis is an infection of the lining of the sinus cavities in your head. Sinusitis often follows a cold. It causes pain and pressure in your head and face.  Take antibiotics as directed. Do not stop taking them just because you feel better. You need to take the full course of antibiotics. Rest, push lots of fluids (especially water), and utilize supportive care for symptoms. Breathe warm, moist area from a steamy shower, hot bath, or sink filled with hot water.  Avoid cold, dry air.  Using a humidifier in your home may help.  Follow the directions for cleaning the machine. Put a hot, wet towel or a warm gel pack on your face 3-4 times a day for 5-10 minutes each time. You may take acetaminophen (Tylenol) every 4-6 hours and ibuprofen every 6-8 hours for muscle pain, joint pain, headaches (you may also alternate these medications). Sudafed (pseudophedrine) is sold behind the counter and can help reduce nasal pressure; avoid taking this if you have high blood pressure or feel jittery. Sudafed PE (phenylephrine) can be a helpful, short-term, over-the-counter alternative to limit side effects or if you have high blood pressure.  Flonase nasal spray can help alleviate congestion and sinus pressure. Many patients choose Afrin as a nasal decongestant; do not use for more than 3 days for risk of rebound (increased symptoms after stopping medication).  Saline nasal sprays or rinses can also help nasal congestion (use bottled or sterile water). Warm tea with lemon and honey can sooth sore throat and cough, as can cough drops.   Return to clinic for new or worse swelling or redness in your face or around your eyes, or if you have a new or higher fever.  

## 2020-11-08 NOTE — ED Triage Notes (Signed)
Pt here with sinus pressure and pain with congestion x 1 month. Has tried many OTC remedies with no relief.

## 2020-11-08 NOTE — ED Provider Notes (Signed)
CHIEF COMPLAINT:   Chief Complaint  Patient presents with   Facial Pain   Nasal Congestion     SUBJECTIVE/HPI:  HPI A very pleasant 50 y.o.Female presents today with facial pain and sinus pressure along with congestion for the last 1 month.  Patient also reports some throat clearing cough.  Patient states that she has had a sinus infection in the past and has been using Allegra and will switch between Allegra and Sudafed, but has not received any relief of symptoms from using these drugs. Patient does not report any shortness of breath, chest pain, palpitations, visual changes, weakness, tingling, headache, nausea, vomiting, diarrhea, fever, chills.   has a past medical history of Lumbago, Multiple sclerosis ([place]), Sinusitis, and Vitamin D deficiency.  ROS:  Review of Systems See Subjective/HPI Medications, Allergies and Problem List personally reviewed in Epic today OBJECTIVE:   Vitals:   11/08/20 1137  BP: (!) 150/98  Pulse: 73  Resp: 20  Temp: 97.8 F (36.6 C)  SpO2: 99%    Physical Exam   General: Appears well-developed and well-nourished. No acute distress.  HEENT Head: Normocephalic and atraumatic.  + frontal, maxillary sinus tenderness noted to palpation. Ears: Hearing grossly intact, no drainage or visible deformity.  Eyes: Conjunctivae and EOM are normal. No eye drainage or scleral icterus bilaterally.  Neck: Normal range of motion, neck is supple.  Cardiovascular: Normal rate Pulm/Chest: No respiratory distress.  Neurological: Alert and oriented to person, place, and time.  Skin: Skin is warm and dry.  No rashes, lesions, abrasions or bruising noted to skin.   Psychiatric: Normal mood, affect, behavior, and thought content.   Vital signs and nursing note reviewed.   Patient stable and cooperative with examination. PROCEDURES:    LABS/X-RAYS/EKG/MEDS:   '@MEDADMIN'$ @ No results found for any visits on 11/08/20.  MEDICAL DECISION MAKING:   Patient  presents with facial pain and sinus pressure along with congestion for the last 1 month.  Patient also reports some throat clearing cough.  Patient states that she has had a sinus infection in the past and has been using Allegra and will switch between Allegra and Sudafed, but has not received any relief of symptoms from using these drugs. Patient does not report any shortness of breath, chest pain, palpitations, visual changes, weakness, tingling, headache, nausea, vomiting, diarrhea, fever, chills.  Chart review completed.  Given symptoms along with assessment findings and length of symptoms, likely bacterial sinusitis.  Rx cefdinir along with Astelin nasal spray to the patient's preferred pharmacy and advised about home treatment and care along with strict return precautions for worsening of symptoms.  Patient verbalized understanding and agreed with treatment plan.  Patient stable upon discharge. ASSESSMENT/PLAN:  1. Bacterial sinusitis  Meds ordered this encounter  Medications   azelastine (ASTELIN) 0.1 % nasal spray    Sig: Place 2 sprays into both nostrils 2 (two) times daily for 7 days. Use in each nostril as directed    Dispense:  30 mL    Refill:  0    Order Specific Question:   Supervising Provider    Answer:   Chase Picket [id number]   cefdinir (OMNICEF) 300 MG capsule    Sig: Take 1 capsule (300 mg total) by mouth 2 (two) times daily for 7 days.    Dispense:  14 capsule    Refill:  0    Order Specific Question:   Supervising Provider    Answer:   Chase Picket [id number]  Instructions about new medications and side effects provided.  Plan:   Discharge Instructions      Sinusitis is an infection of the lining of the sinus cavities in your head. Sinusitis often follows a cold. It causes pain and pressure in your head and face. Take antibiotics as directed. Do not stop taking them just because you feel better. You need to take the full course of antibiotics. Rest,  push lots of fluids (especially water), and utilize supportive care for symptoms. Breathe warm, moist area from a steamy shower, hot bath, or sink filled with hot water.  Avoid cold, dry air.  Using a humidifier in your home may help.  Follow the directions for cleaning the machine. Put a hot, wet towel or a warm gel pack on your face 3-4 times a day for 5-10 minutes each time. You may take acetaminophen (Tylenol) every 4-6 hours and ibuprofen every 6-8 hours for muscle pain, joint pain, headaches (you may also alternate these medications). Sudafed (pseudophedrine) is sold behind the counter and can help reduce nasal pressure; avoid taking this if you have high blood pressure or feel jittery. Sudafed PE (phenylephrine) can be a helpful, short-term, over-the-counter alternative to limit side effects or if you have high blood pressure.  Flonase nasal spray can help alleviate congestion and sinus pressure. Many patients choose Afrin as a nasal decongestant; do not use for more than 3 days for risk of rebound (increased symptoms after stopping medication).  Saline nasal sprays or rinses can also help nasal congestion (use bottled or sterile water). Warm tea with lemon and honey can sooth sore throat and cough, as can cough drops.   Return to clinic for new or worse swelling or redness in your face or around your eyes, or if you have a new or higher fever.        A copy of these instructions have been given to the patient or responsible adult who demonstrated the ability to learn, asked appropriate questions, and verbalized understanding of the plan of care.  There were no barriers to learning identified.    Serafina Royals, FNP-C 11/08/20  This note was partially made with the aid of speech-to-text dictation; typographical errors are not intentional.    Serafina Royals, [place] 11/08/20 1201

## 2020-11-27 ENCOUNTER — Other Ambulatory Visit (HOSPITAL_COMMUNITY): Payer: Self-pay

## 2020-11-28 ENCOUNTER — Other Ambulatory Visit (HOSPITAL_COMMUNITY): Payer: Self-pay

## 2020-11-29 ENCOUNTER — Telehealth (INDEPENDENT_AMBULATORY_CARE_PROVIDER_SITE_OTHER): Payer: 59 | Admitting: Family Medicine

## 2020-11-29 ENCOUNTER — Encounter: Payer: Self-pay | Admitting: Family Medicine

## 2020-11-29 DIAGNOSIS — J309 Allergic rhinitis, unspecified: Secondary | ICD-10-CM | POA: Diagnosis not present

## 2020-11-29 NOTE — Assessment & Plan Note (Signed)
Symptoms are most consistent with allergic rhinitis.  Discussed consistent use of Flonase and regular Allegra.  Advised if those are not beneficial we could try adding Singulair.  She will monitor through the weekend with consistent use of Flonase and Allegra and let us know if she is not improving.

## 2020-11-29 NOTE — Progress Notes (Signed)
Virtual Visit via video note  This visit type was conducted due to national recommendations for restrictions regarding the COVID-19 pandemic (e.g. social distancing).  This format is felt to be most appropriate for this patient at this time.  All issues noted in this document were discussed and addressed.  No physical exam was performed (except for noted visual exam findings with Video Visits).   I connected with Maria Jennings today at 10:00 AM EDT by a video enabled telemedicine application and verified that I am speaking with the correct person using two identifiers. Location patient: home Location provider: work  Persons participating in the virtual visit: patient, provider  I discussed the limitations, risks, security and privacy concerns of performing an evaluation and management service by telephone and the availability of in person appointments. I also discussed with the patient that there may be a patient responsible charge related to this service. The patient expressed understanding and agreed to proceed.  Reason for visit: Same-day visit.  HPI: Allergic rhinitis: Patient notes onset around July 4.  She developed sinus pressure and congestion.  She has been having sinus drainage.  She notes that would come and go.  It is clear.  She was treated with cefdinir and Astelin and notes her symptoms are not any better with that.  She feels as though her ears are blocked up and 1 side of her nostril will get blocked up at night and then if she rolls over the congestion will go to the other side.  She notes she is mostly bothered by this at night.  She notes generally she feels better though continues to have some symptoms.  She has been using Flonase and Allegra-D though has not been using them consistently.  She does not want any steroids.   ROS: See pertinent positives and negatives per HPI.  Past Medical History:  Diagnosis Date   Lumbago    Multiple sclerosis ([place])    Sinusitis     Vitamin D deficiency     Past Surgical History:  Procedure Laterality Date   CESAREAN SECTION     x 1   KNEE ARTHROSCOPY Right 07/2013    Family History  Problem Relation Age of Onset   Hypertension Mother    Hyperlipidemia Mother    Hypertension Father    Cancer Maternal Grandmother 4       colon   Cancer Cousin 41       colon   Breast cancer Neg Hx     SOCIAL HX: Non-smoker   Current Outpatient Medications:    Cholecalciferol (VITAMIN D3) 5000 units CAPS, Take 1 capsule by mouth 2 (two) times a week., Disp: , Rfl:    cyanocobalamin 1000 MCG tablet, Take 3,000 mcg by mouth 2 (two) times a week. , Disp: , Rfl:    Dimethyl Fumarate 240 MG CPDR, Take 1 capsule by mouth twice a day, Disp: 60 capsule, Rfl: 3   fexofenadine-pseudoephedrine (ALLEGRA-D 24) 180-240 MG 24 hr tablet, Take by mouth as needed., Disp: , Rfl:    fluticasone (FLONASE) 50 MCG/ACT nasal spray, Place into both nostrils daily., Disp: , Rfl:    NEOMYCIN-POLYMYXIN-HYDROCORTISONE (CORTISPORIN) 1 % SOLN OTIC solution, PLACE 3 DROPS INTO BOTH EARS 3 TIMES DAILY FOR 4 TO 7 DAYS, Disp: 10 mL, Rfl: 0   neomycin-polymyxin-hydrocortisone (CORTISPORIN) OTIC solution, Place 3 drops into both ears 3 (three) times daily. X4-7 days, Disp: 10 mL, Rfl: 0  EXAM:  VITALS per patient if applicable:  GENERAL:  alert, oriented, appears well and in no acute distress  HEENT: atraumatic, conjunttiva clear, no obvious abnormalities on inspection of external nose and ears  NECK: normal movements of the head and neck  LUNGS: on inspection no signs of respiratory distress, breathing rate appears normal, no obvious gross SOB, gasping or wheezing  CV: no obvious cyanosis  MS: moves all visible extremities without noticeable abnormality  PSYCH/NEURO: pleasant and cooperative, no obvious depression or anxiety, speech and thought processing grossly intact  ASSESSMENT AND PLAN:  Discussed the following assessment and plan:  Problem  List Items Addressed This Visit     Allergic rhinitis    Symptoms are most consistent with allergic rhinitis.  Discussed consistent use of Flonase and regular Allegra.  Advised if those are not beneficial we could try adding Singulair.  She will monitor through the weekend with consistent use of Flonase and Allegra and let us know if she is not improving.       Return in about 2 months (around 01/29/2021) for General follow-up.   I discussed the assessment and treatment plan with the patient. The patient was provided an opportunity to ask questions and all were answered. The patient agreed with the plan and demonstrated an understanding of the instructions.   The patient was advised to call back or seek an in-person evaluation if the symptoms worsen or if the condition fails to improve as anticipated.   Tommi Rumps, MD

## 2020-12-04 ENCOUNTER — Other Ambulatory Visit (HOSPITAL_COMMUNITY): Payer: Self-pay

## 2020-12-28 ENCOUNTER — Other Ambulatory Visit (HOSPITAL_COMMUNITY): Payer: Self-pay

## 2020-12-31 ENCOUNTER — Other Ambulatory Visit (HOSPITAL_COMMUNITY): Payer: Self-pay

## 2021-01-01 ENCOUNTER — Other Ambulatory Visit (HOSPITAL_COMMUNITY): Payer: Self-pay

## 2021-01-02 ENCOUNTER — Other Ambulatory Visit (HOSPITAL_COMMUNITY): Payer: Self-pay

## 2021-01-02 MED ORDER — DIMETHYL FUMARATE 240 MG PO CPDR
DELAYED_RELEASE_CAPSULE | ORAL | 3 refills | Status: DC
  Filled 2021-01-02: qty 60, 30d supply, fill #0
  Filled 2021-01-25: qty 60, 30d supply, fill #1

## 2021-01-16 ENCOUNTER — Other Ambulatory Visit (HOSPITAL_COMMUNITY): Payer: Self-pay

## 2021-01-23 ENCOUNTER — Other Ambulatory Visit (HOSPITAL_COMMUNITY): Payer: Self-pay

## 2021-01-25 ENCOUNTER — Other Ambulatory Visit (HOSPITAL_COMMUNITY): Payer: Self-pay

## 2021-01-30 ENCOUNTER — Other Ambulatory Visit (HOSPITAL_COMMUNITY): Payer: Self-pay

## 2021-02-06 ENCOUNTER — Ambulatory Visit (INDEPENDENT_AMBULATORY_CARE_PROVIDER_SITE_OTHER): Payer: 59 | Admitting: Obstetrics and Gynecology

## 2021-02-06 ENCOUNTER — Other Ambulatory Visit: Payer: Self-pay

## 2021-02-06 ENCOUNTER — Encounter: Payer: Self-pay | Admitting: Obstetrics and Gynecology

## 2021-02-06 VITALS — BP 122/74 | HR 78 | Ht 69.0 in | Wt 176.2 lb

## 2021-02-06 DIAGNOSIS — E559 Vitamin D deficiency, unspecified: Secondary | ICD-10-CM

## 2021-02-06 DIAGNOSIS — Z01419 Encounter for gynecological examination (general) (routine) without abnormal findings: Secondary | ICD-10-CM | POA: Diagnosis not present

## 2021-02-06 DIAGNOSIS — Z8639 Personal history of other endocrine, nutritional and metabolic disease: Secondary | ICD-10-CM | POA: Diagnosis not present

## 2021-02-06 DIAGNOSIS — Z1211 Encounter for screening for malignant neoplasm of colon: Secondary | ICD-10-CM | POA: Diagnosis not present

## 2021-02-06 DIAGNOSIS — Z0000 Encounter for general adult medical examination without abnormal findings: Secondary | ICD-10-CM

## 2021-02-06 NOTE — Patient Instructions (Signed)

## 2021-02-06 NOTE — Progress Notes (Signed)
50 y.o. G2P2002 Married White or Caucasian Not Hispanic or Latino female here for annual exam.  Patient would like labs.  Cycles were monthly, then skipped 10/21, 11/21 and 12/21. Then started up monthly again. No vasomotor symptoms, no dyspareunia.  Period Cycle (Days): 28 Period Duration (Days): 3-4 Period Pattern: Regular Menstrual Flow: Heavy Menstrual Control: Tampon (Super) Menstrual Control Change Freq (Hours): 2-3 Dysmenorrhea: None  MS is stable.   She lost 20 lbs with intermittent fasting.   Patient's last menstrual period was 01/07/2021.          Sexually active: Yes.    The current method of family planning is none.    Exercising: Yes.     Biking tennis paddle boarding  Smoker:  no  Health Maintenance: Pap:  7/24/20wnl Hr HPV Neg.  2017 Neg Hr HPV neg  History of abnormal Pap:  no MMG: 07/22/19 Bi-rads 1 neg  BMD:   none  Colonoscopy: none  TDaP:  utd per patient  Gardasil: none    reports that she has never smoked. She has never used smokeless tobacco. She reports current alcohol use of about 5.0 standard drinks per week. She reports that she does not use drugs. 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  Daughters are almost 41 and almost 88. Her oldest daughter is a Museum/gallery exhibitions officer at [employer] and her younger daughter is a Paramedic at [employer].   Past Medical History:  Diagnosis Date   Lumbago    Multiple sclerosis ([place])    Sinusitis    Vitamin D deficiency     Past Surgical History:  Procedure Laterality Date   CESAREAN SECTION     x 1   KNEE ARTHROSCOPY Right 07/2013    Current Outpatient Medications  Medication Sig Dispense Refill   Cholecalciferol (VITAMIN D3) 5000 units CAPS Take 1 capsule by mouth 2 (two) times a week.     cyanocobalamin 1000 MCG tablet Take 3,000 mcg by mouth 2 (two) times a week.      Dimethyl Fumarate 240 MG CPDR Take 1 capsule by mouth twice a day 60 capsule 3   Dimethyl Fumarate 240 MG CPDR Take 1  tablet by mouth twice a day. 90 capsule 3   fexofenadine-pseudoephedrine (ALLEGRA-D 24) 180-240 MG 24 hr tablet Take by mouth as needed.     No current facility-administered medications for this visit.    Family History  Problem Relation Age of Onset   Hypertension Mother    Hyperlipidemia Mother    Hypertension Father    Cancer Maternal Grandmother 22       colon   Cancer Cousin 70       colon   Breast cancer Neg Hx     Review of Systems  All other systems reviewed and are negative.  Exam:   BP 122/74   Pulse 78   Ht 5\' 9"  (1.753 m)   Wt 176 lb 3.2 oz (79.9 kg)   LMP 01/07/2021   SpO2 100%   BMI 26.02 kg/m   Weight change: @WEIGHTCHANGE @ Height:   Height: 5\' 9"  (175.3 cm)  Ht Readings from Last 3 Encounters:  02/06/21 5\' 9"  (1.753 m)  11/29/20 5' 7.76" (1.721 m)  05/25/20 5' 7.75" (1.721 m)    General appearance: alert, cooperative and appears stated age Head: Normocephalic, without obvious abnormality, atraumatic Neck: no adenopathy, supple, symmetrical, trachea midline and thyroid normal to inspection and palpation Lungs: clear to auscultation bilaterally Cardiovascular:  regular rate and rhythm Breasts: normal appearance, no masses or tenderness Abdomen: soft, non-tender; non distended,  no masses,  no organomegaly Extremities: extremities normal, atraumatic, no cyanosis or edema Skin: Skin color, texture, turgor normal. No rashes or lesions Lymph nodes: Cervical, supraclavicular, and axillary nodes normal. No abnormal inguinal nodes palpated Neurologic: Grossly normal   Pelvic: External genitalia:  no lesions              Urethra:  normal appearing urethra with no masses, tenderness or lesions              Bartholins and Skenes: normal                 Vagina: normal appearing vagina with normal color and discharge, no lesions              Cervix: no lesions               Bimanual Exam:  Uterus:  normal size, contour, position, consistency, mobility,  non-tender              Adnexa: no mass, fullness, tenderness               Rectovaginal: Confirms               Anus:  normal sphincter tone, no lesions  Gae Dry chaperoned for the exam.  1. Well woman exam Discussed breast self exam Discussed calcium and vit D intake No pap this year Mammogram overdue, she will schedule.  2. Laboratory exam ordered as part of routine general medical examination - CBC - Comprehensive metabolic panel - Lipid panel  3. Vitamin D deficiency - VITAMIN D 25 Hydroxy (Vit-D Deficiency, Fractures)  4. History of non anemic vitamin B12 deficiency - Vitamin B12  5. Colon cancer screening Discussed options, she declines for now. She will discuss with her primary.

## 2021-02-07 LAB — CBC
HCT: 43.4 % (ref 35.0–45.0)
Hemoglobin: 14.7 g/dL (ref 11.7–15.5)
MCH: 32.1 pg (ref 27.0–33.0)
MCHC: 33.9 g/dL (ref 32.0–36.0)
MCV: 94.8 fL (ref 80.0–100.0)
MPV: 10.8 fL (ref 7.5–12.5)
Platelets: 255 10*3/uL (ref 140–400)
RBC: 4.58 10*6/uL (ref 3.80–5.10)
RDW: 12.2 % (ref 11.0–15.0)
WBC: 7.3 10*3/uL (ref 3.8–10.8)

## 2021-02-07 LAB — COMPREHENSIVE METABOLIC PANEL
AG Ratio: 2.1 (calc) (ref 1.0–2.5)
ALT: 11 U/L (ref 6–29)
AST: 14 U/L (ref 10–35)
Albumin: 4.6 g/dL (ref 3.6–5.1)
Alkaline phosphatase (APISO): 69 U/L (ref 37–153)
BUN: 17 mg/dL (ref 7–25)
CO2: 22 mmol/L (ref 20–32)
Calcium: 9.3 mg/dL (ref 8.6–10.4)
Chloride: 103 mmol/L (ref 98–110)
Creat: 0.56 mg/dL (ref 0.50–1.03)
Globulin: 2.2 g/dL (calc) (ref 1.9–3.7)
Glucose, Bld: 76 mg/dL (ref 65–99)
Potassium: 3.9 mmol/L (ref 3.5–5.3)
Sodium: 139 mmol/L (ref 135–146)
Total Bilirubin: 0.7 mg/dL (ref 0.2–1.2)
Total Protein: 6.8 g/dL (ref 6.1–8.1)

## 2021-02-07 LAB — VITAMIN D 25 HYDROXY (VIT D DEFICIENCY, FRACTURES): Vit D, 25-Hydroxy: 34 ng/mL (ref 30–100)

## 2021-02-07 LAB — LIPID PANEL
Cholesterol: 174 mg/dL (ref <200)
HDL: 65 mg/dL (ref >=50)
LDL Cholesterol (Calc): 90 mg/dL (calc)
Non-HDL Cholesterol (Calc): 109 mg/dL (calc) (ref <130)
Total CHOL/HDL Ratio: 2.7 (calc) (ref <5.0)
Triglycerides: 92 mg/dL (ref <150)

## 2021-02-07 LAB — VITAMIN B12: Vitamin B-12: 544 pg/mL (ref 200–1100)

## 2021-02-13 ENCOUNTER — Other Ambulatory Visit: Payer: Self-pay | Admitting: Pharmacist

## 2021-02-13 ENCOUNTER — Other Ambulatory Visit (HOSPITAL_COMMUNITY): Payer: Self-pay

## 2021-02-13 MED ORDER — DIMETHYL FUMARATE 240 MG PO CPDR
DELAYED_RELEASE_CAPSULE | ORAL | 3 refills | Status: DC
Start: 2021-02-13 — End: 2021-09-05
  Filled 2021-02-13: qty 90, fill #0
  Filled 2021-02-28: qty 60, 30d supply, fill #0
  Filled 2021-04-01: qty 60, 30d supply, fill #1
  Filled 2021-04-26: qty 60, 30d supply, fill #2
  Filled 2021-05-31: qty 60, 30d supply, fill #3
  Filled 2021-07-03: qty 60, 30d supply, fill #4
  Filled 2021-08-02: qty 60, 30d supply, fill #5

## 2021-02-15 DIAGNOSIS — L738 Other specified follicular disorders: Secondary | ICD-10-CM | POA: Diagnosis not present

## 2021-02-15 DIAGNOSIS — D2262 Melanocytic nevi of left upper limb, including shoulder: Secondary | ICD-10-CM | POA: Diagnosis not present

## 2021-02-15 DIAGNOSIS — D2261 Melanocytic nevi of right upper limb, including shoulder: Secondary | ICD-10-CM | POA: Diagnosis not present

## 2021-02-15 DIAGNOSIS — L708 Other acne: Secondary | ICD-10-CM | POA: Diagnosis not present

## 2021-02-15 DIAGNOSIS — L821 Other seborrheic keratosis: Secondary | ICD-10-CM | POA: Diagnosis not present

## 2021-02-15 DIAGNOSIS — D2272 Melanocytic nevi of left lower limb, including hip: Secondary | ICD-10-CM | POA: Diagnosis not present

## 2021-02-15 DIAGNOSIS — D225 Melanocytic nevi of trunk: Secondary | ICD-10-CM | POA: Diagnosis not present

## 2021-02-15 DIAGNOSIS — D2271 Melanocytic nevi of right lower limb, including hip: Secondary | ICD-10-CM | POA: Diagnosis not present

## 2021-02-26 ENCOUNTER — Other Ambulatory Visit (HOSPITAL_COMMUNITY): Payer: Self-pay

## 2021-02-28 ENCOUNTER — Other Ambulatory Visit (HOSPITAL_COMMUNITY): Payer: Self-pay

## 2021-03-04 ENCOUNTER — Other Ambulatory Visit (HOSPITAL_COMMUNITY): Payer: Self-pay

## 2021-03-13 DIAGNOSIS — H5213 Myopia, bilateral: Secondary | ICD-10-CM | POA: Diagnosis not present

## 2021-03-13 DIAGNOSIS — Z135 Encounter for screening for eye and ear disorders: Secondary | ICD-10-CM | POA: Diagnosis not present

## 2021-03-13 DIAGNOSIS — H524 Presbyopia: Secondary | ICD-10-CM | POA: Diagnosis not present

## 2021-03-27 ENCOUNTER — Other Ambulatory Visit (HOSPITAL_COMMUNITY): Payer: Self-pay

## 2021-03-29 ENCOUNTER — Other Ambulatory Visit (HOSPITAL_COMMUNITY): Payer: Self-pay

## 2021-04-01 ENCOUNTER — Other Ambulatory Visit (HOSPITAL_COMMUNITY): Payer: Self-pay

## 2021-04-13 IMAGING — MR MR CERVICAL SPINE WO/W CM
8 series · 45 of 48 positions shown · IV contrast (gadavist)
Comparison: Head MRI today cervical spine MRI 06/27/2016.

CLINICAL DATA: 49-year-old female diagnosed with multiple sclerosis
in 6227. Restaging.

EXAM:
MRI CERVICAL SPINE WITHOUT AND WITH CONTRAST
TECHNIQUE: Multiplanar and multiecho pulse sequences of the cervical spine, to
include the craniocervical junction and cervicothoracic junction,
were obtained without and with intravenous contrast.
CONTRAST:  8mL GADAVIST GADOBUTROL 1 MMOL/ML IV SOLN in conjunction
with contrast enhanced imaging of the brain reported separately.

[Series 14: T2 · sagittal · 3.0mm · 0.69mm/px · 4 of 15 slices shown (1 of 2)]
[im 1/15]
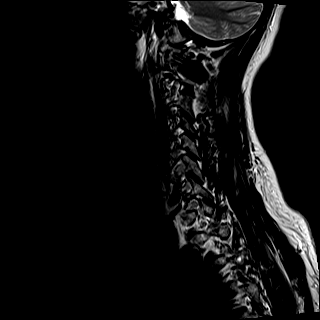
[im 5/15]
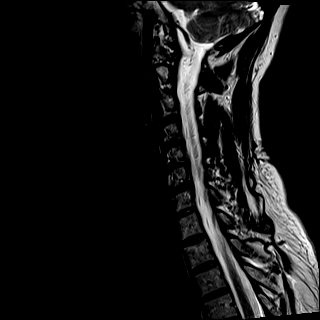
[im 10/15]
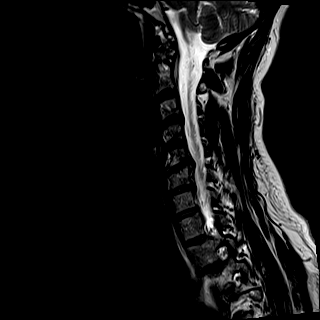
[im 15/15]
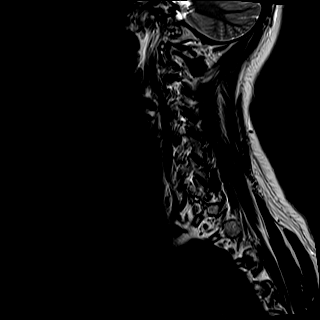

[Series 15: T1 · sagittal · 3.0mm · 0.86mm/px · 4 of 15 slices shown (1 of 2)]
[im 1/15]
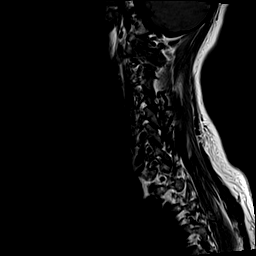
[im 5/15]
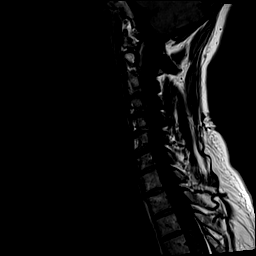
[im 10/15]
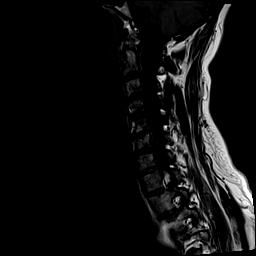
[im 15/15]
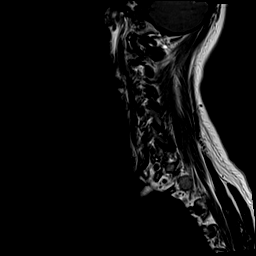

[Series 17: STIR · sagittal · 3.0mm · 0.69mm/px · 4 of 15 slices shown]
[im 1/15]
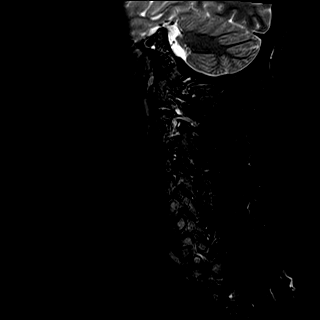
[im 5/15]
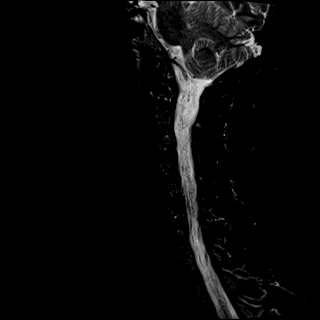
[im 10/15]
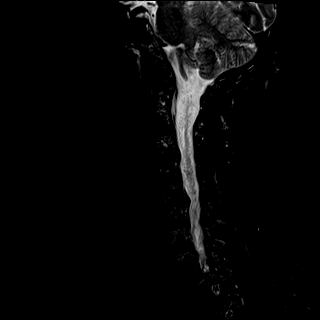
[im 15/15]
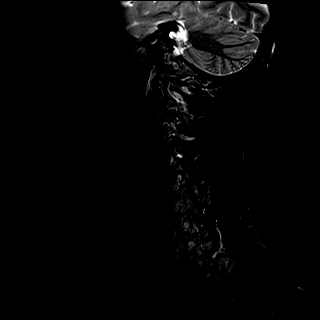

[Series 19: T2 · axial · 3.0mm · 0.62mm/px · z∈[-9,+89]mm · 8 of 27 slices shown (2 of 2)]
[im 1/27]
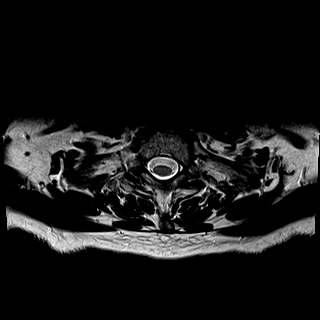
[im 4/27]
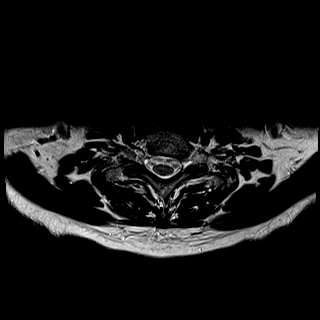
[im 8/27]
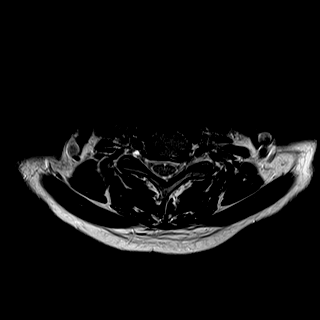
[im 12/27]
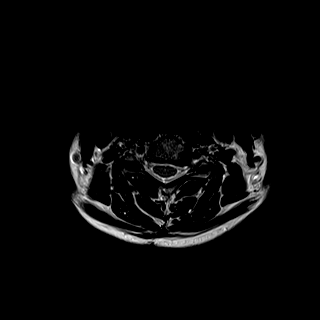
[im 15/27]
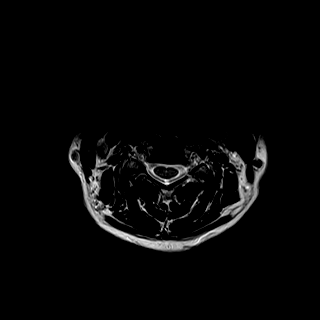
[im 19/27]
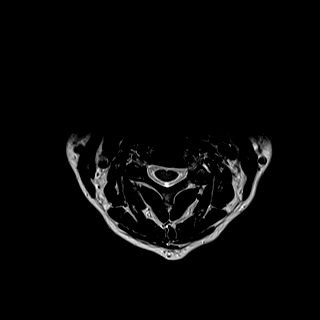
[im 23/27]
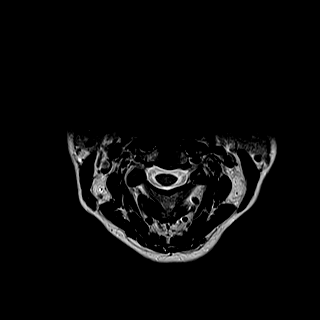
[im 27/27]
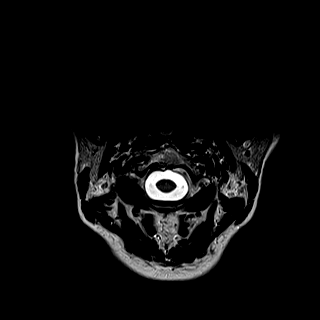

[Series 21: mpgr ax · axial · 3.0mm · 0.35mm/px · z∈[-2,+51]mm · 5 of 27 slices shown]
[im 1/27]
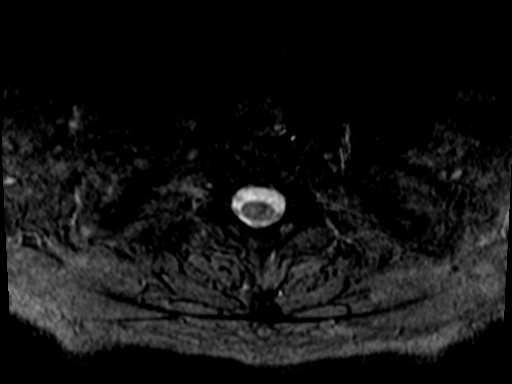
[im 4/27]
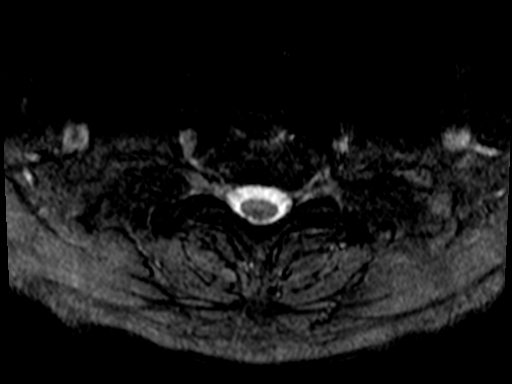
[im 8/27]
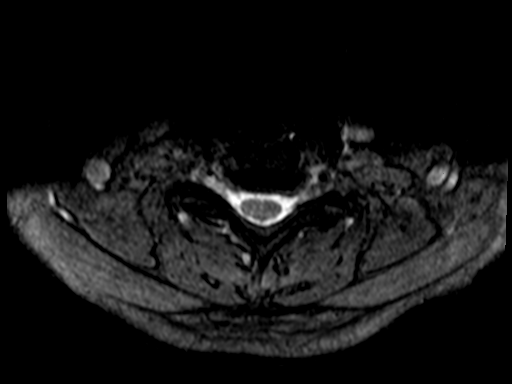
[im 12/27]
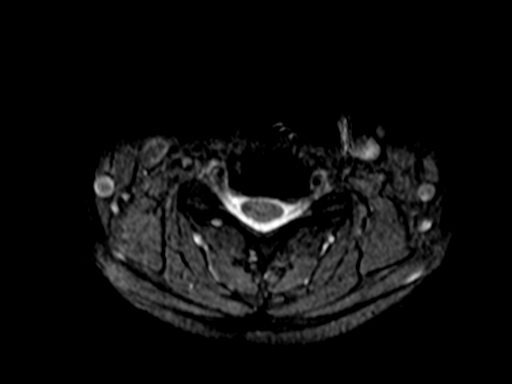
[im 15/27]
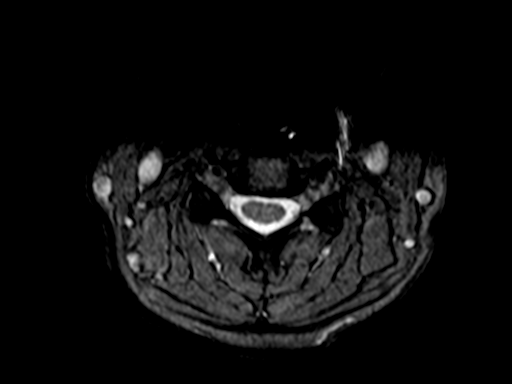

[Series 22: T1 · axial · non-contrast · 3.0mm · 0.78mm/px · z∈[-9,+89]mm · 8 of 27 slices shown (2 of 2)]
[im 1/27]
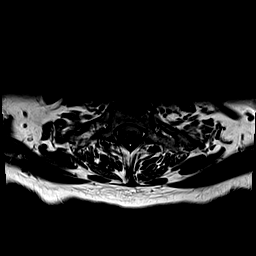
[im 4/27]
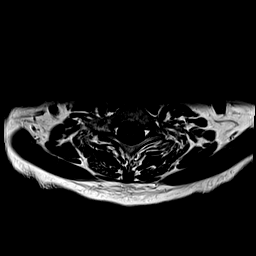
[im 8/27]
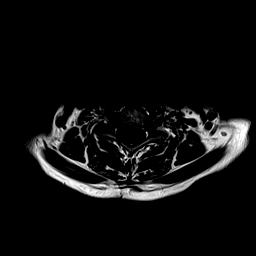
[im 12/27]
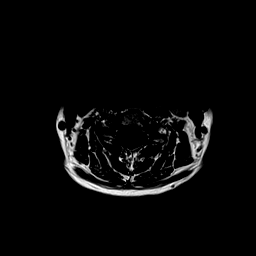
[im 15/27]
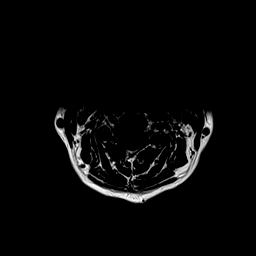
[im 19/27]
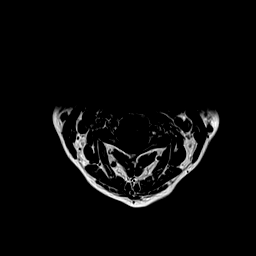
[im 23/27]
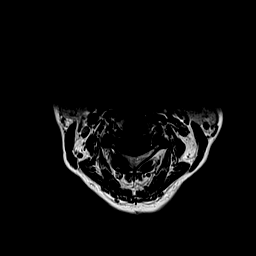
[im 27/27]
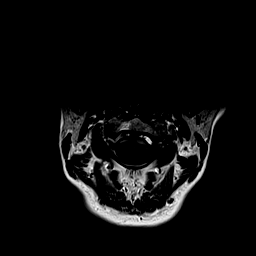

[Series 23: T1 fat-sat post-contrast · sagittal · 3.0mm · 0.69mm/px · 4 of 15 slices shown]
[im 1/15]
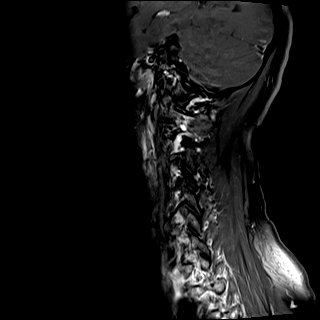
[im 5/15]
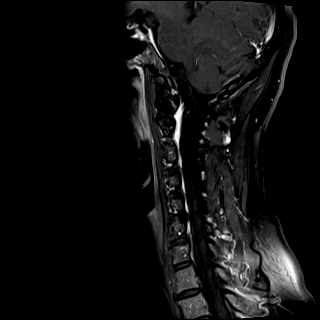
[im 10/15]
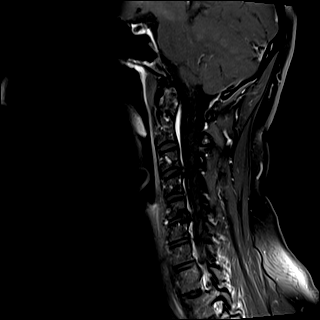
[im 15/15]
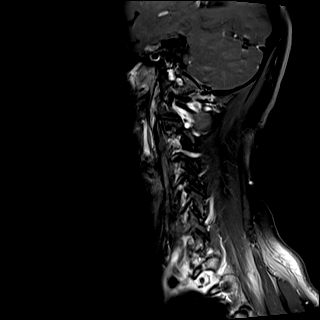

[Series 24: T1 post-contrast · axial · 3.0mm · 0.78mm/px · z∈[-9,+89]mm · 8 of 27 slices shown]
[im 1/27]
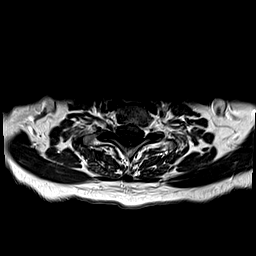
[im 4/27]
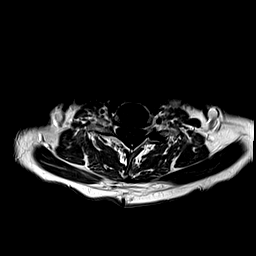
[im 8/27]
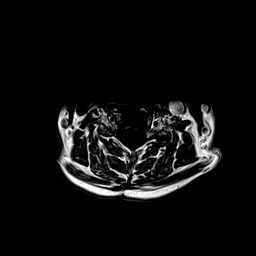
[im 12/27]
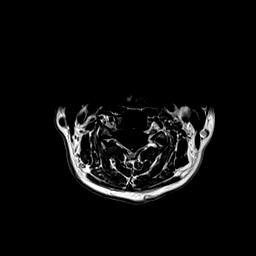
[im 15/27]
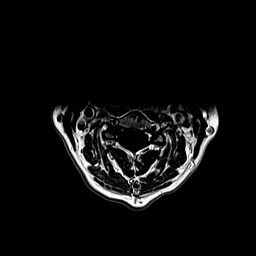
[im 19/27]
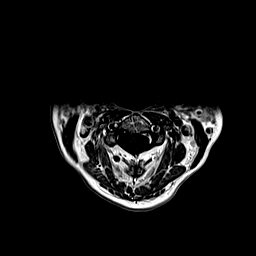
[im 23/27]
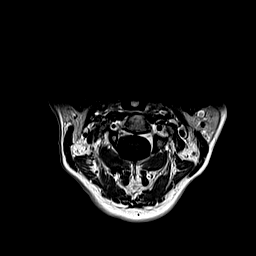
[im 27/27]
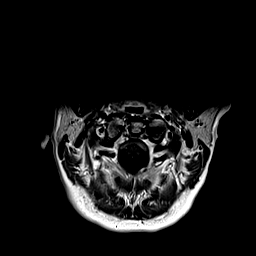

[45 of 48 positions shown; findings below may reference images not displayed]

FINDINGS: Alignment: Chronic straightening of cervical lordosis.

Vertebrae: No marrow edema or evidence of acute osseous abnormality.
Visualized bone marrow signal is within normal limits.

Cord: Small chronic dorsal cord lesion at the C3 level remains
stable (series 21, image 6). No new cervical or visible the upper
thoracic spinal cord lesion identified. No abnormal intradural
enhancement. No dural thickening.

Posterior Fossa, vertebral arteries, paraspinal tissues:
Cervicomedullary junction is within normal limits. Brain detailed
separately today. Preserved major vascular flow voids in the neck.
Tortuous left vertebral artery as before. Negative visible neck soft
tissues, lung apices.

Disc levels:

Capacious cervical spinal canal. Generally mild for age cervical
spine degeneration with no spinal stenosis.

However, circumferential disc bulging at C6-C7 has progressed since
7385, with mild new bilateral C7 foraminal stenosis. Chronic C5-C6
disc and endplate degeneration is stable with mild left foraminal
stenosis.
IMPRESSION: 1. Chronic small dorsal cord lesion at C3 remains stable. No new or
active cervical cord demyelination identified.

2. Progressed C6-C7 disc degeneration since 7385, with mild new
bilateral C7 foraminal stenosis. No spinal stenosis.

## 2021-04-26 ENCOUNTER — Other Ambulatory Visit (HOSPITAL_COMMUNITY): Payer: Self-pay

## 2021-04-30 ENCOUNTER — Other Ambulatory Visit (HOSPITAL_COMMUNITY): Payer: Self-pay

## 2021-04-30 ENCOUNTER — Telehealth: Payer: Self-pay | Admitting: Pharmacist

## 2021-04-30 NOTE — Telephone Encounter (Signed)
Called patient to schedule an appointment for the [facility]. I was unable to reach the patient so I left a HIPAA-compliant message requesting that the patient return my call.   Luke Van Ausdall, PharmD, BCACP, CPP Clinical Pharmacist [facility] [phone]  

## 2021-05-07 ENCOUNTER — Other Ambulatory Visit: Payer: Self-pay

## 2021-05-07 ENCOUNTER — Ambulatory Visit: Payer: 59 | Attending: Family Medicine | Admitting: Pharmacist

## 2021-05-07 DIAGNOSIS — Z79899 Other long term (current) drug therapy: Secondary | ICD-10-CM

## 2021-05-07 NOTE — Progress Notes (Signed)
S: Patient presents today for review of their specialty medication.    Patient is currently taking Tecfidera (dimethyl fumarate) for MS. Patient is managed by Dr. Manuella Ghazi for this.    Adherence: confirms   Efficacy: pt is happy with his results   Dosing: 240 mg BID   Renal adjustment: no adjustment necessary   Hepatic adjustment: no adjustment necessary   Dose adjustment for toxicity:  Flushing, GI intolerance, or intolerance to maintenance dose: Consider temporary dose reduction to 120 mg twice daily (resume recommended maintenance dose of 240 mg twice daily within 4 weeks). Consider discontinuation in patients who cannot tolerate return to the maintenance dose.   Hepatic injury (suspected drug-induced), clinically significant: Discontinue treatment.   Lymphocyte count <500/mm3 persisting for >6 months: Consider treatment interruption.   Serious infection: Consider withholding treatment until infection resolves.   Current adverse effects: Flushing, skin rash, or puritus: none S/sx of infection: none  GI upset: none  S/sx of heptotoxicity: none  O: Labs followed by patient's PCP. WNL per pt.   BMP Latest Ref Rng & Units 02/06/2021 01/10/2020 11/05/2018  Glucose 65 - 99 mg/dL 76 88 80  BUN 7 - 25 mg/dL 17 12 11   Creatinine 0.50 - 1.03 mg/dL 0.56 0.61 0.59  BUN/Creat Ratio 6 - 22 (calc) NOT APPLICABLE - 19  Sodium 135 - 146 mmol/L 139 137 139  Potassium 3.5 - 5.3 mmol/L 3.9 4.1 4.2  Chloride 98 - 110 mmol/L 103 103 102  CO2 20 - 32 mmol/L 22 30 23   Calcium 8.6 - 10.4 mg/dL 9.3 9.1 9.1   CBC    Component Value Date/Time   WBC 7.3 02/06/2021 1437   RBC 4.58 02/06/2021 1437   HGB 14.7 02/06/2021 1437   HGB 14.6 02/07/2020 0915   HCT 43.4 02/06/2021 1437   HCT 43.3 02/07/2020 0915   PLT 255 02/06/2021 1437   PLT 221 02/07/2020 0915   MCV 94.8 02/06/2021 1437   MCV 91 02/07/2020 0915   MCV 90 12/15/2013 0752   MCH 32.1 02/06/2021 1437   MCHC 33.9 02/06/2021 1437   RDW  12.2 02/06/2021 1437   RDW 14.0 02/07/2020 0915   RDW 15.8 (H) 12/15/2013 0752   LYMPHSABS 1.2 08/08/2020 0803   LYMPHSABS 1.3 02/07/2020 0915   LYMPHSABS 1.2 12/15/2013 0752   MONOABS 0.4 08/08/2020 0803   MONOABS 0.5 12/15/2013 0752   EOSABS 0.4 08/08/2020 0803   EOSABS 0.3 02/07/2020 0915   EOSABS 0.2 12/15/2013 0752   BASOSABS 0.1 08/08/2020 0803   BASOSABS 0.1 02/07/2020 0915   BASOSABS 0.0 12/15/2013 0752    A/P: 1. Medication review: Patient is currently on Tecfidera for MS and is tolerating it well. Reviewed the medication with the patient, including the following: dimethyl fumarate activates the Nrf2 pathway, which is believed to result in anti-inflammatory and cytoprotective properties. The medication is oral and should be swallowed whole. Administering with a high-fat, high-protein meal may decrease flushing and GI side effects. Possible adverse effects include skin flushing, pruritus, GI upset, albuminura, infection, lymphocytopenia, and increased liver transaminases. Cases of PML have been reported with severe, long-standing lymphopenia identified as the primary risk for PML. Dose adjustments for toxicities have been summarized above. No recommendations for any changes at this time.  Benard Halsted, PharmD, [facility] [place] [place] [phone]

## 2021-05-22 ENCOUNTER — Ambulatory Visit
Admission: RE | Admit: 2021-05-22 | Discharge: 2021-05-22 | Disposition: A | Discharge status: Home / Self Care | Payer: 59 | Source: Ambulatory Visit | Attending: Family Medicine | Admitting: Family Medicine

## 2021-05-22 ENCOUNTER — Other Ambulatory Visit: Payer: Self-pay

## 2021-05-22 ENCOUNTER — Other Ambulatory Visit (HOSPITAL_COMMUNITY): Payer: Self-pay

## 2021-05-22 DIAGNOSIS — Z1231 Encounter for screening mammogram for malignant neoplasm of breast: Secondary | ICD-10-CM | POA: Diagnosis not present

## 2021-05-28 ENCOUNTER — Other Ambulatory Visit (HOSPITAL_COMMUNITY): Payer: Self-pay

## 2021-05-30 ENCOUNTER — Other Ambulatory Visit (HOSPITAL_COMMUNITY): Payer: Self-pay

## 2021-05-31 ENCOUNTER — Other Ambulatory Visit (HOSPITAL_COMMUNITY): Payer: Self-pay

## 2021-06-04 ENCOUNTER — Other Ambulatory Visit (HOSPITAL_COMMUNITY): Payer: Self-pay

## 2021-06-19 ENCOUNTER — Encounter: Payer: Self-pay | Admitting: Family Medicine

## 2021-06-21 ENCOUNTER — Other Ambulatory Visit (INDEPENDENT_AMBULATORY_CARE_PROVIDER_SITE_OTHER): Payer: 59

## 2021-06-21 ENCOUNTER — Other Ambulatory Visit: Payer: Self-pay

## 2021-06-21 DIAGNOSIS — E559 Vitamin D deficiency, unspecified: Secondary | ICD-10-CM

## 2021-06-21 DIAGNOSIS — D72818 Other decreased white blood cell count: Secondary | ICD-10-CM | POA: Diagnosis not present

## 2021-06-21 DIAGNOSIS — Z0000 Encounter for general adult medical examination without abnormal findings: Secondary | ICD-10-CM

## 2021-06-21 DIAGNOSIS — Z13220 Encounter for screening for lipoid disorders: Secondary | ICD-10-CM | POA: Diagnosis not present

## 2021-06-21 DIAGNOSIS — I10 Essential (primary) hypertension: Secondary | ICD-10-CM | POA: Diagnosis not present

## 2021-06-21 LAB — COMPREHENSIVE METABOLIC PANEL
ALT: 11 U/L (ref 0–35)
AST: 13 U/L (ref 0–37)
Albumin: 4.5 g/dL (ref 3.5–5.2)
Alkaline Phosphatase: 61 U/L (ref 39–117)
BUN: 14 mg/dL (ref 6–23)
CO2: 27 mEq/L (ref 19–32)
Calcium: 9.8 mg/dL (ref 8.4–10.5)
Chloride: 101 mEq/L (ref 96–112)
Creatinine, Ser: 0.64 mg/dL (ref 0.40–1.20)
GFR: 101.13 mL/min (ref >60.00)
Glucose, Bld: 86 mg/dL (ref 70–99)
Potassium: 4.3 mEq/L (ref 3.5–5.1)
Sodium: 137 mEq/L (ref 135–145)
Total Bilirubin: 0.5 mg/dL (ref 0.2–1.2)
Total Protein: 6.6 g/dL (ref 6.0–8.3)

## 2021-06-21 LAB — CBC
HCT: 42 % (ref 36.0–46.0)
Hemoglobin: 14.3 g/dL (ref 12.0–15.0)
MCHC: 34 g/dL (ref 30.0–36.0)
MCV: 94.6 fl (ref 78.0–100.0)
Platelets: 196 10*3/uL (ref 150.0–400.0)
RBC: 4.44 Mil/uL (ref 3.87–5.11)
RDW: 13.4 % (ref 11.5–15.5)
WBC: 5.5 10*3/uL (ref 4.0–10.5)

## 2021-06-21 LAB — LIPID PANEL
Cholesterol: 174 mg/dL (ref 0–200)
HDL: 68.7 mg/dL (ref >39.00)
LDL Cholesterol: 93 mg/dL (ref 0–99)
NonHDL: 105.61
Total CHOL/HDL Ratio: 3
Triglycerides: 65 mg/dL (ref 0.0–149.0)
VLDL: 13 mg/dL (ref 0.0–40.0)

## 2021-06-21 LAB — VITAMIN D 25 HYDROXY (VIT D DEFICIENCY, FRACTURES): VITD: 33.92 ng/mL (ref 30.00–100.00)

## 2021-06-21 LAB — VITAMIN B12: Vitamin B-12: 456 pg/mL (ref 211–911)

## 2021-06-25 ENCOUNTER — Other Ambulatory Visit (HOSPITAL_COMMUNITY): Payer: Self-pay

## 2021-06-26 ENCOUNTER — Ambulatory Visit: Payer: 59 | Admitting: Family Medicine

## 2021-06-26 ENCOUNTER — Other Ambulatory Visit: Payer: Self-pay

## 2021-06-26 ENCOUNTER — Encounter: Payer: Self-pay | Admitting: Family Medicine

## 2021-06-26 ENCOUNTER — Ambulatory Visit (INDEPENDENT_AMBULATORY_CARE_PROVIDER_SITE_OTHER): Payer: 59

## 2021-06-26 VITALS — BP 130/80 | HR 91 | Temp 98.2°F | Ht 69.0 in | Wt 182.4 lb

## 2021-06-26 DIAGNOSIS — E559 Vitamin D deficiency, unspecified: Secondary | ICD-10-CM | POA: Diagnosis not present

## 2021-06-26 DIAGNOSIS — M25511 Pain in right shoulder: Secondary | ICD-10-CM

## 2021-06-26 DIAGNOSIS — G35 Multiple sclerosis: Secondary | ICD-10-CM | POA: Diagnosis not present

## 2021-06-26 DIAGNOSIS — M958 Other specified acquired deformities of musculoskeletal system: Secondary | ICD-10-CM

## 2021-06-26 DIAGNOSIS — I10 Essential (primary) hypertension: Secondary | ICD-10-CM | POA: Diagnosis not present

## 2021-06-26 DIAGNOSIS — R110 Nausea: Secondary | ICD-10-CM

## 2021-06-26 DIAGNOSIS — D72818 Other decreased white blood cell count: Secondary | ICD-10-CM | POA: Diagnosis not present

## 2021-06-26 DIAGNOSIS — Z13220 Encounter for screening for lipoid disorders: Secondary | ICD-10-CM

## 2021-06-26 DIAGNOSIS — M4184 Other forms of scoliosis, thoracic region: Secondary | ICD-10-CM | POA: Diagnosis not present

## 2021-06-26 DIAGNOSIS — M898X1 Other specified disorders of bone, shoulder: Secondary | ICD-10-CM

## 2021-06-26 DIAGNOSIS — M47812 Spondylosis without myelopathy or radiculopathy, cervical region: Secondary | ICD-10-CM | POA: Diagnosis not present

## 2021-06-26 DIAGNOSIS — Z0000 Encounter for general adult medical examination without abnormal findings: Secondary | ICD-10-CM

## 2021-06-26 NOTE — Assessment & Plan Note (Signed)
Asymptomatic.  She will continue to follow with neurology.  Lab work reviewed. ?

## 2021-06-26 NOTE — Assessment & Plan Note (Signed)
She will continue vitamin D supplementation. ?

## 2021-06-26 NOTE — Assessment & Plan Note (Signed)
Recent issue.  We will get an x-ray and refer for physical therapy.  The patient requested a paper prescription for PT so she can call to get this scheduled with the PT of her choice. ?

## 2021-06-26 NOTE — Progress Notes (Signed)
?Tommi Rumps, MD ?Phone: [phone] ? ?Maria Jennings is a 51 y.o. female who presents today for follow-up. ? ?MS: Patient follows with neurology.  She notes they advised she could follow-up with them every 2 years.  She reports no symptoms.  She continues on Tecfidera. ? ?Vitamin D deficiency: Patient was not taking her vitamin D for a month or [place] when [place].  She is back on vitamin D. ? ?Nausea: Patient reports she was doing some intermittent fasting last year and lost weight.  She stopped doing this over the holidays though resumed it recently and felt as though she would get nauseous in the afternoon after she ate her first meal of the day.  She started adding in some breakfast over the last week and notes the nausea has resolved.  No abdominal pain, vomiting, or diarrhea. ? ?Right shoulder pain/winged scapula: Patient notes this has been going on recently.  She feels spasms across her shoulders.  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  She has been getting a sharp pain underneath her shoulder blade.  She notes her right acromion seems to be elevated higher than the left.  She wonders about physical therapy and imaging. ? ?Social History  ? ?Tobacco Use  ?Smoking Status Never  ?Smokeless Tobacco Never  ? ? ?Current Outpatient Medications on File Prior to Visit  ?Medication Sig Dispense Refill  ? Cholecalciferol (VITAMIN D3) 5000 units CAPS Take 1 capsule by mouth 2 (two) times a week.    ? cyanocobalamin 1000 MCG tablet Take 3,000 mcg by mouth 2 (two) times a week.     ? Dimethyl Fumarate 240 MG CPDR Take 1 tablet by mouth twice a day. 90 capsule 3  ? fexofenadine-pseudoephedrine (ALLEGRA-D 24) 180-240 MG 24 hr tablet Take by mouth as needed.    ? ?No current facility-administered medications on file prior to visit.  ? ? ? ?ROS see history of present illness ? ?Objective ? ?Physical Exam ?Vitals:  ? 06/26/21 1517  ?BP: 130/80  ?Pulse: 91  ?Temp: 98.2  ?F (36.8 ?C)  ?SpO2: 99%  ? ? ?BP Readings from Last 3 Encounters:  ?06/26/21 130/80  ?02/06/21 122/74  ?11/08/20 (!) 150/98  ? ?Wt Readings from Last 3 Encounters:  ?06/26/21 182 lb 6.4 oz (82.7 kg)  ?02/06/21 176 lb 3.2 oz (79.9 kg)  ?11/29/20 173 lb (78.5 kg)  ? ? ?Physical Exam ?Constitutional:   ?   General: She is not in acute distress. ?   Appearance: She is not diaphoretic.  ?Cardiovascular:  ?   Rate and Rhythm: Normal rate and regular rhythm.  ?   Heart sounds: Normal heart sounds.  ?Pulmonary:  ?   Effort: Pulmonary effort is normal.  ?   Breath sounds: Normal breath sounds.  ?Musculoskeletal:  ?   Comments: Right scapula does appear higher than the left, no midline tenderness, some spasm in the right trapezius muscle, her right AC joint does appear more prominent than the left  ?Skin: ?   General: Skin is warm and dry.  ?Neurological:  ?   Mental Status: She is alert.  ?   Comments: 5/5 strength bilateral biceps, triceps, and grip, sensation to light touch intact bilateral upper extremities  ? ? ? ?Assessment/Plan: Please see individual problem list. ? ?Problem List Items Addressed This Visit   ? ? Acute pain of right shoulder - Primary  ?  Recent issue.  We will get an x-ray and refer for physical therapy.  The patient requested a paper prescription for PT so she can call to get this scheduled with the PT of her choice. ?  ?  ? Relevant Orders  ? DG Shoulder Right  ? Lipid screening  ? Relevant Orders  ? Lipid panel (Completed)  ? Multiple sclerosis ([place])  ?  Asymptomatic.  She will continue to follow with neurology.  Lab work reviewed. ?  ?  ? Nausea  ?  Has resolved at this point.  Discussed likely related to inadequate food intake.  Discussed making sure she gets something for breakfast consistently.  If it recurs she will let me know. ?  ?  ? Routine general medical examination at a health care facility  ? Relevant Orders  ? B12 (Completed)  ? Vitamin D deficiency  ?  She will continue vitamin D  supplementation. ?  ?  ? Relevant Orders  ? Vitamin D (25 hydroxy) (Completed)  ? Winged scapula  ?  History of this in the past.  We will obtain x-rays of her shoulder, cervical spine, and thoracic spine.  We will refer for physical therapy. ?  ?  ? Relevant Orders  ? DG Cervical Spine Complete  ? DG Thoracic Spine 4V  ? ?Other Visit Diagnoses   ? ? Other decreased white blood cell (WBC) count      ? Relevant Orders  ? CBC (Completed)  ? Essential hypertension      ? Relevant Orders  ? Comp Met (CMET) (Completed)  ? Pain in scapula      ? Relevant Orders  ? DG Shoulder Right  ? ?  ? ? ?Return in about 1 year (around 06/27/2022). ? ?This visit occurred during the SARS-CoV-2 public health emergency.  Safety protocols were in place, including screening questions prior to the visit, additional usage of staff PPE, and extensive cleaning of exam room while observing appropriate contact time as indicated for disinfecting solutions.  ? ? ?Tommi Rumps, MD ?[place] ? ?

## 2021-06-26 NOTE — Assessment & Plan Note (Signed)
Has resolved at this point.  Discussed likely related to inadequate food intake.  Discussed making sure she gets something for breakfast consistently.  If it recurs she will let me know. ?

## 2021-06-26 NOTE — Assessment & Plan Note (Signed)
History of this in the past.  We will obtain x-rays of her shoulder, cervical spine, and thoracic spine.  We will refer for physical therapy. ?

## 2021-06-26 NOTE — Patient Instructions (Signed)
Nice to see you. ?We will contact you with your x-ray results.  ?Please see PT as discussed. If you are not improving please let me know.  ?

## 2021-06-27 ENCOUNTER — Other Ambulatory Visit (HOSPITAL_COMMUNITY): Payer: Self-pay

## 2021-07-01 ENCOUNTER — Other Ambulatory Visit (HOSPITAL_COMMUNITY): Payer: Self-pay

## 2021-07-03 ENCOUNTER — Other Ambulatory Visit (HOSPITAL_COMMUNITY): Payer: Self-pay

## 2021-07-08 ENCOUNTER — Other Ambulatory Visit (HOSPITAL_COMMUNITY): Payer: Self-pay

## 2021-07-11 ENCOUNTER — Other Ambulatory Visit (HOSPITAL_COMMUNITY): Payer: Self-pay

## 2021-07-29 ENCOUNTER — Ambulatory Visit: Payer: 59 | Attending: Family Medicine | Admitting: Physical Therapy

## 2021-07-29 ENCOUNTER — Encounter: Payer: Self-pay | Admitting: Physical Therapy

## 2021-07-29 DIAGNOSIS — M25511 Pain in right shoulder: Secondary | ICD-10-CM | POA: Diagnosis not present

## 2021-07-29 DIAGNOSIS — G8929 Other chronic pain: Secondary | ICD-10-CM | POA: Insufficient documentation

## 2021-07-29 DIAGNOSIS — M25512 Pain in left shoulder: Secondary | ICD-10-CM | POA: Diagnosis not present

## 2021-07-29 DIAGNOSIS — R293 Abnormal posture: Secondary | ICD-10-CM | POA: Diagnosis not present

## 2021-07-29 DIAGNOSIS — M533 Sacrococcygeal disorders, not elsewhere classified: Secondary | ICD-10-CM | POA: Diagnosis not present

## 2021-07-29 DIAGNOSIS — R278 Other lack of coordination: Secondary | ICD-10-CM | POA: Insufficient documentation

## 2021-07-29 NOTE — Therapy (Signed)
[facility] ?[place], [place], [address] ?Phone: [phone]   Fax:  [phone] ? ?Physical Therapy Evaluation ? ?Patient Details  ?Name: Maria Jennings ?MRN: [id number] ?Date of Birth: 12-Sep-1970 ?Referring Provider (PT): Sonneberg ? ? ?Encounter Date: 07/29/2021 ? ? PT End of Session - 07/29/21 1508   ? ? Visit Number 1   ? Number of Visits 10   ? Date for PT Re-Evaluation 10/07/21   ? PT Start Time 1503   ? PT Stop Time 1600   ? PT Time Calculation (min) 57 min   ? Activity Tolerance Patient tolerated treatment well   ? ?  ?  ? ?  ? ? ?Past Medical History:  ?Diagnosis Date  ? Lumbago   ? Multiple sclerosis ([place])   ? Sinusitis   ? Vitamin D deficiency   ? ? ?Past Surgical History:  ?Procedure Laterality Date  ? CESAREAN SECTION    ? x 1  ? KNEE ARTHROSCOPY Right 07/2013  ? ? ?There were no vitals filed for this visit. ? ? ? Subjective Assessment - 07/29/21 1508   ? ? Subjective 1) SIJ pain that started pain that started after her last daughter was born 80 years ago.  Pt had lifted her infant and also a suitcase which caused the back spasm.  Throughout tese 16 years, pt had mm spasms occured twice when she was reachign for blinds and was mountain biking. Pt has fear of pain that  makes her weaker and weaker and is not longer doing overnight running races. Pt  has not trusted her back the last year. Pt modifes to picks up water bottle.   Pt had PT sessions.  Pt does not trust people around her back because she is protective. In 2020 for 6-8 months, 2 x week, pt was doing strengthening with bridging, UE isometrics, bands, dumbbells 5 lbs, lunges, [place], and worked with Clinical research associate. Pt stopped with the trainer because she felt the trainer was pushing her with too much weights. Pt started to do routine on her own and was able to moutnatin bike and paddle board for 1.5 years. Pt did not do any strength training her in our routine and she feels she does  not have lots of upper back. Pt does yoga 4 days a week.  Pt does not have radiating pain down the pain.   ? ?2) B shoulder pain:  R shoulder pain started with  upper trap spasm 3-4  weeks ago.  Pt can get it under control with postural changes. Pt picked up 40 bottle water one day and it started mm spasms in her L deltoid. Pt had strain injury to her R shoulder when transferring a patient in 1997 and PT helped it. 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 Pt has not played golf due to back pain.  Pt notices premenopausal Sx causes mm spasms with upper traps. 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  ? ?  ? Pertinent History Denied falls onto tailbone, C section and vaginal delivery.  Pt has been sleeping on her stomach for the past 16 years or sleeping on her side.   ? Patient Stated Goals get education to get back to running mountain bike, tennis, gardening  and to decrese fear of back pain   ? ?  ?  ? ?  ? ? ? ? ?  [facility] PT Assessment - 07/29/21 1539   ? ?  ? Assessment  ? Medical Diagnosis Bilateral shoulder pain   ? Referring Provider (PT) Biagio Quint   ?  ? Precautions  ? Precautions None   ?  ? Restrictions  ? Weight Bearing Restrictions No   ?  ? Balance Screen  ? Has the patient fallen in the past 6 months No   ?  ? Coordination  ? Coordination and Movement Description multidifis delayed,  chest breathing, abd straining   ?  ? Posture/Postural Control  ? Posture Comments pain in low back in prone hip ext testing position   ?  ? AROM  ? Overall AROM Comments cervical sideflexion 50 deg B, ext/ flex 50 deg B , rotation 35 deg cervical   ?  ? Strength  ? Overall Strength Comments scaption / opp hip ext  3/5  , pertubration with RUE/ L hip ext ,   ?  ? Palpation  ? SI assessment  R iliac crest lower, R shoulder higher   ?  ? Special Tests  ? Other special tests cervical endurance 1:30 in  prone   ? ?  ?  ? ?  ? ? ? ? ? ? ? ? ? ? ? ? ? ?Objective measurements completed on examination: See above findings.  ? ? ? Pelvic Floor Special Questions - 07/29/21 1553   ? ? Diastasis Recti neg   ? ?  ?  ? ?  ? ? ? [facility] PT Treatment/Exercise - 07/29/21 1833   ? ?  ? Therapeutic Activites   ? Other Therapeutic Activities explained approach of deep core coordination and deep cervicothoracic/ thoracolumbar stabilization techniques, and modifications to her workout routine will be part of her POC   ?  ? Neuro Re-ed   ? Neuro Re-ed Details  cued for HEP to address higher R shoulder height, cerivcoscapular staibilization in modified low cobra exericse with pillow placed under abdomen to minimize increased lumbar lordosis   ? ?  ?  ? ?  ? ? ? ? ? ? ? ? ? ? ? ? ? ? ? PT Long Term Goals - 07/29/21 1824   ? ?  ? PT LONG TERM GOAL #1  ? Title Pt will improve Lumbar FOTOfrom 47 pts to > 67 pts in order to return to PLOF and return to [employer], tennis, golf, running   ? Time 10   ? Period Weeks   ? Status New   ? Target Date 10/07/21   ?  ? PT LONG TERM GOAL #2  ? Title Pt will demo levelled pelvic girdle and shoulder height   ? Baseline R pelvic girdle lower, R shoulder higher   ? Time 2   ? Period Weeks   ? Status New   ? Target Date 08/12/21   ?  ? PT LONG TERM GOAL #3  ? Title Pt will demo increased cervical mm endurance from 1:30 to > 2:30 before fatigue/ complaints of deltoid ache in order to continue performing work tasks with less upper trap overuse   ? Time 5   ? Status New   ? Target Date 09/02/21   ?  ? PT LONG TERM GOAL #4  ? Title Pt will demo no perturbation with posterior sling mm test,  activation of multidifis, and increased hip ext from 3/5 > 4/5 B to demo improved deep core strength and posterior chain to minimize mm spasms with functional  tasks   ? Time 8   ? Period Weeks   ? Status New   ? Target Date 09/23/21   ?  ? PT LONG TERM GOAL #5  ? Title Pt will demo IND with alignment and technique  with fitness routine with co-activation of deep core in order to help pt maintain workout routine and progress with UE strengthening without relapse of SIJ / shoulder pain.   ? Time 10   ? Period Weeks   ? Status New   ? Target Date 10/07/21   ?  ? Additional Long Term Goals  ? Additional Long Term Goals Yes   ?  ? PT LONG TERM GOAL #6  ? Title Pt will increase cervical rotation from 30 deg B to > 45 deg in order to improve head turns for driving and mountain biking   ? Time 6   ? Period Weeks   ? Status New   ? Target Date 09/09/21   ?  ? PT LONG TERM GOAL #7  ? Title Pt will report sleeping on her back and side 75% of the time in order to avoid sleeping on her belly to mininize asymmetries of her neck and spinal alignment   ? Time 10   ? Period Weeks   ? Status New   ? Target Date 10/07/21   ? ?  ?  ? ?  ? ? ? ? ? ? ? ? ? Plan - 07/29/21 1836   ? ? Clinical Impression Statement . ?Pt is a  51  yo  who presents with SIJ pain and B shoulder pain which impact her work and physical activities and workout routine. Pt has fear of return of mm spasms/ pain and would like to learn more about how to return to her sports and fitness.  ? ?Pt's musculoskeletal assessment revealed higher R shoulder, L iliac crest crest, weakness of deep core system, poor recruitment of multifidis mm, weakness of hip extension, and low cervical endurance, and limited knowledge for proper techniques with fitness routine.  ? ?Pt will benefit from coordination training and education on fitness, yoga, and functional positions in order to gain a more effective intraabdominal pressure system to minimize SIJ pain and shoulder pain. ? ?Pt was provided education on etiology of Sx with anatomy, physiology explanation with images along with the benefits of customized Tx based on pt's medical conditions and musculoskeletal deficits.   ? ?Regional interdependent approaches will yield greater benefits in pt's POC.  ? ?Following Tx today which pt tolerated  without complaints, pt demo'd equal alignment of pelvic girdle and increased spinal mobility. Withheld manual Tx per pt request as pt reported feeling protective of her back. Plan to provide education and

## 2021-07-29 NOTE — Patient Instructions (Signed)
Low cobra: modified to cervicosacpular  ? ?On belly, palms under armpits, elbows pointed to ceiling  ?Inhale: lengthen crown of the head away from shoulders ?Exhale, feel belly hug in and press palms into the floor, squeezing elbows/shoulder blades towards each other while chest lifts about 5 cm off of the floor. You should feel the hinging movement at mid back and not the low back.  ? ? 5 sec, 10 reps  ? ?__ ? ? ?Lengthen Back rib by R  shoulder  ?  ?Lie on L  side , pillow between knees and under head  ?Pull  arm overhead over mattress, grab the edge of mattress,pull it upward, drawing elbow away from ears  ?Breathing ?10 reps ? ?Open book (handout)  ?Lying on  L side , rotating  3/4 turn _R_ only this week  ?Rotating onto pillow /yoga block  ?Pillow/ Block between knees  ?10 reps ? ?

## 2021-07-30 ENCOUNTER — Other Ambulatory Visit (HOSPITAL_COMMUNITY): Payer: Self-pay

## 2021-07-31 ENCOUNTER — Ambulatory Visit: Payer: 59 | Admitting: Physical Therapy

## 2021-07-31 DIAGNOSIS — M25512 Pain in left shoulder: Secondary | ICD-10-CM | POA: Diagnosis not present

## 2021-07-31 DIAGNOSIS — G8929 Other chronic pain: Secondary | ICD-10-CM

## 2021-07-31 DIAGNOSIS — R278 Other lack of coordination: Secondary | ICD-10-CM | POA: Diagnosis not present

## 2021-07-31 DIAGNOSIS — R293 Abnormal posture: Secondary | ICD-10-CM | POA: Diagnosis not present

## 2021-07-31 DIAGNOSIS — M533 Sacrococcygeal disorders, not elsewhere classified: Secondary | ICD-10-CM

## 2021-07-31 DIAGNOSIS — M25511 Pain in right shoulder: Secondary | ICD-10-CM | POA: Diagnosis not present

## 2021-08-01 ENCOUNTER — Other Ambulatory Visit (HOSPITAL_COMMUNITY): Payer: Self-pay

## 2021-08-01 NOTE — Patient Instructions (Addendum)
?  Proper body mechanics with getting out of a chair to decrease strain  ?on back &pelvic floor  ? ?Avoid holding your breath when ?Getting out of the chair: ? ?Scoot to front part of chair chair ?Heels behind knees, feet are hip width apart, nose over toes  ?Inhale like you are smelling roses ?Exhale to stand  ? ? ?MAKE SURE TO plant feet and sitting bones and find pelvic neutral when sitting on stool.  When getting up from stool, make sure your heels are back, pelvic anterior tilt, use leg and gluts to assist with standing ?__ ? ? ?HOLD OFF ON HALF KNEELING exercise you selected  ?Downgrade to backward lunges with hand on counter and practice proper alignment and body awareness to activate gluts of back leg  ? ?___ ? ?Stepping  back exercise with opp arm in scaption for 2 min  ? - use chair against wall, use lines on tile floors for hip width apart  feet placement ? - put knee against chair seat and ankle right below to ensure perpendicular force through shin , stepping opp foot back hip width apart, heels up, ballmound planted  ? - opp arm of back leg in scaption and shoudler down and back  ? ?__ ? ?Minisquat: ?Scoot buttocks back slight, hinge like you are looking at your reflection on a pond  ?Knees behind toes,  ?Inhale to "smell flowers" ? ?Exhale on the rise "like rocket"  ?Do not lock knees, have more weight across ballmounds of feet, toes relaxed  ? ?10 reps x 3 x day  ? ?___ ? Standing posture :  ?Catch yourself when you are leaning on wall, countertop or leaning on one leg. Find midline,  ? Ballmounds and heels equal weight, knees slightly unlocked, center of mass more anterior, heart above pelvis, head above heart.  ? ?Notice old posture: locked knees, all weight on heels or standing on one leg shifted off center.  ? ?Sitting posture: ?Four points of contact, ?Pelvic neutral  ? ? ?

## 2021-08-01 NOTE — Therapy (Signed)
[facility] ?[place], [place], [address] ?Phone: [phone]   Fax:  [phone] ? ?Physical Therapy Treatment ? ?Patient Details  ?Name: Maria Jennings ?MRN: [id number] ?Date of Birth: [birth year] ?Referring Provider (PT): Sonneberg ? ? ?Encounter Date: 07/31/2021 ? ? ? ?Past Medical History:  ?Diagnosis Date  ? Lumbago   ? Multiple sclerosis ([place])   ? Sinusitis   ? Vitamin D deficiency   ? ? ?Past Surgical History:  ?Procedure Laterality Date  ? CESAREAN SECTION    ? x 1  ? KNEE ARTHROSCOPY Right 07/2013  ? ? ?There were no vitals filed for this visit. ? ? Subjective Assessment - 08/01/21 1353   ? ? Subjective Pt reported less LBP since last session. Pt would like to perform half kneeling and lunge into her exercise routine. Pt would like to stand on one leg to pick items off the floor. Pt has pain with sit to stand when standing up from stool at work. Pt is planting plants and has fear of LBP occuring with back pain.   ? Pertinent History Denied falls onto tailbone, C section and vaginal delivery.  Pt has been sleeping on her stomach for the past 16 years or sleeping on her side.   ? Patient Stated Goals get education to get back to running mountain bike, tennis, gardening  and to decrese fear of back pain   ? ?  ?  ? ?  ? ? ? ? ? [facility] PT Assessment - 08/01/21 1355   ? ?  ? Lunges  ? Comments poor stability with Wbing on lateral foot in front foot, UE reaching for stability   poor propiception along kinetic chain with backward lunges  ?  ? Single Leg Stance  ? Comments golfer's lift technique showed poror stability at foot   ?  ? Sit to Stand  ? Comments posterior tilt in sitting and limited anterior tilt prior to sit sto stand,   ?  ? Posture/Postural Control  ? Posture Comments hyperextension of knees in standing/ posteiror COM,  pt leans on wall or counter or on one leg   ? ?  ?  ? ?  ? ? ? ? ? ? ? ? ? ? ? ? ? ? ? ? [facility] Adult PT  Treatment/Exercise - 08/01/21 1355   ? ?  ? Therapeutic Activites   ? Other Therapeutic Activities explained alignmetn and biomechanics with sit to stand, standing posture, SLS, squats, half kneeling, bending, provided pain science education to pt decrease fear of movement   ?  ? Neuro Re-ed   ? Neuro Re-ed Details  , used visual and tactile cues for alignment and techniques in functional tasks   ? ?  ?  ? ?  ? ? ? ? ? ? ? ? ? ? ? ? ? ? ? PT Long Term Goals - 07/29/21 1824   ? ?  ? PT LONG TERM GOAL #1  ? Title Pt will improve Lumbar FOTOfrom 47 pts to > 67 pts in order to return to PLOF and return to [employer], tennis, golf, running   ? Time 10   ? Period Weeks   ? Status New   ? Target Date 10/07/21   ?  ? PT LONG TERM GOAL #2  ? Title Pt will demo levelled pelvic girdle and shoulder height   ? Baseline R pelvic girdle lower, R shoulder higher   ? Time  2   ? Period Weeks   ? Status New   ? Target Date 08/12/21   ?  ? PT LONG TERM GOAL #3  ? Title Pt will demo increased cervical mm endurance from 1:30 to > 2:30 before fatigue/ complaints of deltoid ache in order to continue performing work tasks with less upper trap overuse   ? Time 5   ? Status New   ? Target Date 09/02/21   ?  ? PT LONG TERM GOAL #4  ? Title Pt will demo no perturbation with posterior sling mm test,  activation of multidifis, and increased hip ext from 3/5 > 4/5 B to demo improved deep core strength and posterior chain to minimize mm spasms with functional tasks   ? Time 8   ? Period Weeks   ? Status New   ? Target Date 09/23/21   ?  ? PT LONG TERM GOAL #5  ? Title Pt will demo IND with alignment and technique with fitness routine with co-activation of deep core in order to help pt maintain workout routine and progress with UE strengthening without relapse of SIJ / shoulder pain.   ? Time 10   ? Period Weeks   ? Status New   ? Target Date 10/07/21   ?  ? Additional Long Term Goals  ? Additional Long Term Goals Yes   ?  ? PT LONG TERM GOAL #6   ? Title Pt will increase cervical rotation from 30 deg B to > 45 deg in order to improve head turns for driving and mountain biking   ? Time 6   ? Period Weeks   ? Status New   ? Target Date 09/09/21   ?  ? PT LONG TERM GOAL #7  ? Title Pt will report sleeping on her back and side 75% of the time in order to avoid sleeping on her belly to mininize asymmetries of her neck and spinal alignment   ? Time 10   ? Period Weeks   ? Status New   ? Target Date 10/07/21   ? ?  ?  ? ?  ? ? ? ? ? ? ? ? Plan - 08/01/21 1440   ? ? Clinical Impression Statement Pt reported less back pain after last session.  ? ?Pt required excessive tactile, visual, and verbal cues to improve propioception and body awareness. Pt demo'd IND with correcting standing posture to midline from her tendency to heel Wbing, posterior COM, leaning on walls, counters. Pt was explained alignment and biomechanics with sit to stand, standing posture, SLS, squats, half kneeling, bending, provided pain science education to pt decrease fear of movement. Pt demo'd proper technique with simulated tasks at home and work. Anticipate today's session will help pt decrease her fear of movement and understand more about preventative ways for enhanced propioception and body awareness to minimize LBP.  Downgraded half kneeling exercise which pt selected. Downgraded golfer's lift to pick items off ground and advised pt to use single UE support and be attentive to feet placement and activation with COM. Pt demo'd correctly after trianing. Pt continues to benefit from skilled PT.   ? Examination-Activity Limitations Sleep;Other;Lift   ? Stability/Clinical Decision Making Evolving/Moderate complexity   ? Rehab Potential Good   ? PT Frequency 1x / week   ? PT Duration --   10  ? PT Treatment/Interventions Stair training;Gait training;Neuromuscular re-education;Functional mobility training;Therapeutic activities;Moist Heat;Therapeutic exercise;Cryotherapy;Manual  techniques;Patient/family education;Taping;Energy conservation;Dry needling;ADLs/Self Care Home Management;Traction;Passive  range of motion;Scar mobilization;Joint Manipulations;Splinting   ? Consulted and Agree with Plan of Care Patient   ? ?  ?  ? ?  ? ? ?Patient will benefit from skilled therapeutic intervention in order to improve the following deficits and impairments:  Decreased activity tolerance, Decreased balance, Decreased safety awareness, Decreased endurance, Decreased strength, Decreased coordination, Improper body mechanics, Pain, Postural dysfunction, Decreased mobility, Hypomobility, Decreased range of motion, Impaired flexibility, Increased muscle spasms, Decreased scar mobility ? ?Visit Diagnosis: ?Abnormal posture ? ?Chronic right shoulder pain ? ?Other lack of coordination ? ?Chronic left shoulder pain ? ?Sacrococcygeal disorders, not elsewhere classified ? ? ? ? ?Problem List ?Patient Active Problem List  ? Diagnosis Date Noted  ? Acute pain of right shoulder 06/26/2021  ? 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 Nausea 06/26/2021  ? Allergic rhinitis 11/29/2020  ? Lipid screening 01/10/2020  ? Encounter for screening for COVID-19 05/03/2019  ? Abnormal mammogram 01/15/2018  ? Vitamin D deficiency 06/11/2016  ? Atypical chest pain 07/06/2015  ? DOE (dyspnea on exertion) 07/06/2015  ? High risk medication use 06/05/2014  ? Optic neuritis 11/04/2013  ? Multiple sclerosis ([place]) 11/04/2013  ? Routine general medical examination at a health care facility 08/16/2012  ? ? ?Jerl Mina, PT ?08/01/2021, 2:41 PM ? ? ?[facility] ?[place], [place], [address] ?Phone: [phone]   Fax:  [phone] ? ?Name: Maria Jennings ?MRN: [id number] ?Date of Birth: Aug 05, 1970 ? ? ? ?

## 2021-08-02 ENCOUNTER — Other Ambulatory Visit (HOSPITAL_COMMUNITY): Payer: Self-pay

## 2021-08-27 ENCOUNTER — Other Ambulatory Visit (HOSPITAL_COMMUNITY): Payer: Self-pay

## 2021-09-02 ENCOUNTER — Other Ambulatory Visit (HOSPITAL_COMMUNITY): Payer: Self-pay

## 2021-09-03 ENCOUNTER — Ambulatory Visit (INDEPENDENT_AMBULATORY_CARE_PROVIDER_SITE_OTHER): Payer: 59

## 2021-09-03 ENCOUNTER — Other Ambulatory Visit: Payer: 59

## 2021-09-03 ENCOUNTER — Telehealth: Payer: Self-pay | Admitting: Family Medicine

## 2021-09-03 DIAGNOSIS — M4186 Other forms of scoliosis, lumbar region: Secondary | ICD-10-CM | POA: Diagnosis not present

## 2021-09-03 DIAGNOSIS — G8929 Other chronic pain: Secondary | ICD-10-CM | POA: Diagnosis not present

## 2021-09-03 DIAGNOSIS — M4807 Spinal stenosis, lumbosacral region: Secondary | ICD-10-CM | POA: Diagnosis not present

## 2021-09-03 DIAGNOSIS — M545 Low back pain, unspecified: Secondary | ICD-10-CM | POA: Diagnosis not present

## 2021-09-03 DIAGNOSIS — M47816 Spondylosis without myelopathy or radiculopathy, lumbar region: Secondary | ICD-10-CM | POA: Diagnosis not present

## 2021-09-03 NOTE — Addendum Note (Signed)
Addended by: Leone Haven on: 09/03/2021 09:03 AM   Modules accepted: Orders

## 2021-09-03 NOTE — Telephone Encounter (Signed)
Message received from patient as outlined below.  X-ray ordered.  Please get her scheduled for this.  "Hi Dr Caryl Bis - I had 2 PT sessions but I am little guarded for them to advance my HEP because of having back pain at times thru the years since the birth of my 2nd child- are there any chance you can order me xray for my lower back so I can advance my exercises or modify please  without fear- I can came anytime today after lunch -thank you -m"

## 2021-09-03 NOTE — Telephone Encounter (Signed)
Patient is scheduled for xray today.  ng

## 2021-09-04 ENCOUNTER — Other Ambulatory Visit (HOSPITAL_COMMUNITY): Payer: Self-pay

## 2021-09-05 ENCOUNTER — Other Ambulatory Visit (HOSPITAL_COMMUNITY): Payer: Self-pay

## 2021-09-05 ENCOUNTER — Other Ambulatory Visit: Payer: Self-pay | Admitting: Pharmacist

## 2021-09-05 MED ORDER — DIMETHYL FUMARATE 240 MG PO CPDR
DELAYED_RELEASE_CAPSULE | ORAL | 3 refills | Status: DC
  Filled 2021-09-05: qty 60, 30d supply, fill #0
  Filled 2021-09-26: qty 60, 30d supply, fill #1
  Filled 2021-10-31: qty 60, 30d supply, fill #2
  Filled 2021-11-28: qty 60, 30d supply, fill #3
  Filled 2022-01-01: qty 60, 30d supply, fill #4
  Filled 2022-01-23: qty 60, 30d supply, fill #5

## 2021-09-05 MED ORDER — DIMETHYL FUMARATE 240 MG PO CPDR
DELAYED_RELEASE_CAPSULE | ORAL | 3 refills | Status: DC
  Filled 2021-09-05: qty 90, fill #0

## 2021-09-26 ENCOUNTER — Other Ambulatory Visit (HOSPITAL_COMMUNITY): Payer: Self-pay

## 2021-10-03 ENCOUNTER — Ambulatory Visit: Payer: 59 | Attending: Family Medicine | Admitting: Physical Therapy

## 2021-10-03 DIAGNOSIS — R293 Abnormal posture: Secondary | ICD-10-CM | POA: Insufficient documentation

## 2021-10-03 DIAGNOSIS — M25512 Pain in left shoulder: Secondary | ICD-10-CM | POA: Diagnosis not present

## 2021-10-03 DIAGNOSIS — M25511 Pain in right shoulder: Secondary | ICD-10-CM | POA: Insufficient documentation

## 2021-10-03 DIAGNOSIS — G8929 Other chronic pain: Secondary | ICD-10-CM | POA: Insufficient documentation

## 2021-10-03 DIAGNOSIS — R278 Other lack of coordination: Secondary | ICD-10-CM | POA: Insufficient documentation

## 2021-10-03 DIAGNOSIS — M533 Sacrococcygeal disorders, not elsewhere classified: Secondary | ICD-10-CM | POA: Diagnosis not present

## 2021-10-03 NOTE — Patient Instructions (Addendum)
Multifidis twist  Band is on doorknob: sit facing perpendicular to door , firm through 4 points of contact   Twisting trunk without moving the hips and knees Hold band at the level of ribcage, elbows bent,shoulder blades roll back and down like squeezing a pencil under armpit   Exhale twist,.10-15 deg away from door without moving your hips/ knees, press more weight on the side of the sitting bones/ foot opp of your direction of turn as your counterweight. Continue to maintain equal weight through legs.  Keep knee unlocked.  30 reps   ___  Stretches to lower L hip height  - scoot hips to L when doing hamstring/IT band stretch on L leg and then dropping leg to R onto pillows  -scoot hips to L and pillow between knees and under R thigh and drop to R -scoot hips to L and same as above but  bring L ankle towards buttocks for quad stretch  "dead man"  Versions of childs pose on knees with toes tucked under ( toe extension)  -childs pose  5 breaths  -L hand to R hand   5 breaths -table top position,  modified thread the needle, move R arm back under shoulder, then sliding L forearm on bed, look under armpit  10 reps

## 2021-10-03 NOTE — Therapy (Addendum)
[place] MAIN [facility] SERVICES [address] [place], [place], [address] Phone: [phone]   Fax:  [phone]  Physical Therapy Treatment  Patient Details  Name: Maria Jennings MRN: [id number] Date of Birth: 06-26-1970 Referring Provider (PT): Biagio Quint   Encounter Date: 10/03/2021   PT End of Session - 10/03/21 0808     Visit Number 3    Number of Visits 10    Date for PT Re-Evaluation 10/07/21    PT Start Time 0806    PT Stop Time 0847    PT Time Calculation (min) 41 min    Activity Tolerance Patient tolerated treatment well             Past Medical History:  Diagnosis Date   Lumbago    Multiple sclerosis ([place])    Sinusitis    Vitamin D deficiency     Past Surgical History:  Procedure Laterality Date   CESAREAN SECTION     x 1   KNEE ARTHROSCOPY Right 07/2013    There were no vitals filed for this visit.   Subjective Assessment - 10/03/21 0813     Subjective Pt reports she were able paddle board, swim, and more yardwork. Today she feels upper trap/ scalenes tightness.    Pertinent History Denied falls onto tailbone, C section and vaginal delivery.  Pt has been sleeping on her stomach for the past 16 years or sleeping on her side.    Patient Stated Goals get education to get back to running mountain bike, tennis, gardening  and to decrese fear of back pain                [facility] PT Assessment - 10/03/21 0952       Palpation   SI assessment  R shoulder slightly higher, L iliac crest slightly higher                           [facility] Adult PT Treatment/Exercise - 10/03/21 9381       Therapeutic Activites    Other Therapeutic Activities active listening to goals, discussed scheduling consecutive appts to yield longer lasting improvements, explained versions of childs pose and L back stretches to apply at work and at home, answered questions about stretches and made them compatible with her own  selected stretches      Neuro Re-ed    Neuro Re-ed Details  cued for alignment and technique, provided cues for stabilization of lower kinetic chain , dissassociation between thorax/ pelvis in multidifis twist                          PT Long Term Goals - 10/03/21 0955       PT LONG TERM GOAL #1   Title Pt will improve Lumbar FOTOfrom 47 pts to > 67 pts in order to return to PLOF and return to [employer], tennis, golf, running    Time 10    Period Weeks    Status On-going    Target Date 10/07/21      PT LONG TERM GOAL #2   Title Pt will demo levelled pelvic girdle and shoulder height    Baseline R pelvic girdle lower, R shoulder higher    Time 2    Period Weeks    Status On-going    Target Date 08/12/21      PT LONG TERM GOAL #3   Title  Pt will demo increased cervical mm endurance from 1:30 to > 2:30 before fatigue/ complaints of deltoid ache in order to continue performing work tasks with less upper trap overuse    Time 5    Status On-going    Target Date 09/02/21      PT LONG TERM GOAL #4   Title Pt will demo no perturbation with posterior sling mm test,  activation of multidifis, and increased hip ext from 3/5 > 4/5 B to demo improved deep core strength and posterior chain to minimize mm spasms with functional tasks    Time 8    Period Weeks    Status On-going    Target Date 09/23/21      PT LONG TERM GOAL #5   Title Pt will demo IND with alignment and technique with fitness routine with co-activation of deep core in order to help pt maintain workout routine and progress with UE strengthening without relapse of SIJ / shoulder pain.    Time 10    Period Weeks    Status On-going    Target Date 10/07/21      PT LONG TERM GOAL #6   Title Pt will increase cervical rotation from 30 deg B to > 45 deg in order to improve head turns for driving and mountain biking    Time 6    Period Weeks    Status On-going    Target Date 09/09/21      PT LONG TERM  GOAL #7   Title Pt will report sleeping on her back and side 75% of the time in order to avoid sleeping on her belly to mininize asymmetries of her neck and spinal alignment    Time 10    Period Weeks    Status On-going    Target Date 10/07/21                   Plan - 10/03/21 0952     Clinical Impression Statement Pt reports she were able paddle board, swim, and more yardwork. Today she feels upper trap/ scalenes tightness.   Pt showed slighly higher L iliac crest and R shoulder compared to opposite side and thus, stretches were customized for pt to address this deficit. Pt declined manual Tx. Pt received cues for alignment and techniques for stretches to address the higher L iliac crest. Modified her self-selected stretches and provided explanations with images of anatomy to help pt bette runderstand the rationale for HEP and for strengthening multidifis mm.   Plan to progress with deep core HEP at next session. Reassess if stretches helped to realign iliac crest on L and if not, proceed with further thoracic/ upper quadrant therapeutic exercises and also assessment for leg length difference.  Pt continues to benefit from skilled PT. Pt agreed to schedule 6 consecutive appts in August to yield more consistency and effectiveness to Tx.      Examination-Activity Limitations Sleep;Other;Lift    Stability/Clinical Decision Making Evolving/Moderate complexity    Rehab Potential Good    PT Frequency 1x / week    PT Duration --   10   PT Treatment/Interventions Stair training;Gait training;Neuromuscular re-education;Functional mobility training;Therapeutic activities;Moist Heat;Therapeutic exercise;Cryotherapy;Manual techniques;Patient/family education;Taping;Energy conservation;Dry needling;ADLs/Self Care Home Management;Traction;Passive range of motion;Scar mobilization;Joint Manipulations;Splinting    Consulted and Agree with Plan of Care Patient             Patient will  benefit from skilled therapeutic intervention in order to improve the following deficits and impairments:  Decreased activity tolerance, Decreased balance, Decreased safety awareness, Decreased endurance, Decreased strength, Decreased coordination, Improper body mechanics, Pain, Postural dysfunction, Decreased mobility, Hypomobility, Decreased range of motion, Impaired flexibility, Increased muscle spasms, Decreased scar mobility  Visit Diagnosis: Abnormal posture  Chronic right shoulder pain  Other lack of coordination  Chronic left shoulder pain  Sacrococcygeal disorders, not elsewhere classified     Problem List Patient Active Problem List   Diagnosis Date Noted   Acute pain of right shoulder 06/26/2021   Winged scapula 06/26/2021   Nausea 06/26/2021   Allergic rhinitis 11/29/2020   Lipid screening 01/10/2020   Encounter for screening for COVID-19 05/03/2019   Abnormal mammogram 01/15/2018   Vitamin D deficiency 06/11/2016   Atypical chest pain 07/06/2015   DOE (dyspnea on exertion) 07/06/2015   High risk medication use 06/05/2014   Optic neuritis 11/04/2013   Multiple sclerosis ([place]) 11/04/2013   Routine general medical examination at a health care facility 08/16/2012    Jerl Mina, PT 10/03/2021, 10:02 AM  [employer] [address] [place], [place], [address] Phone: [phone]   Fax:  [phone]  Name: Maria Jennings MRN: [id number] Date of Birth: 10-Oct-1970

## 2021-10-08 ENCOUNTER — Other Ambulatory Visit (HOSPITAL_COMMUNITY): Payer: Self-pay

## 2021-10-16 ENCOUNTER — Ambulatory Visit: Payer: 59 | Attending: Family Medicine | Admitting: Physical Therapy

## 2021-10-16 DIAGNOSIS — M533 Sacrococcygeal disorders, not elsewhere classified: Secondary | ICD-10-CM | POA: Insufficient documentation

## 2021-10-16 DIAGNOSIS — M25511 Pain in right shoulder: Secondary | ICD-10-CM | POA: Diagnosis not present

## 2021-10-16 DIAGNOSIS — M25512 Pain in left shoulder: Secondary | ICD-10-CM | POA: Diagnosis not present

## 2021-10-16 DIAGNOSIS — G8929 Other chronic pain: Secondary | ICD-10-CM | POA: Insufficient documentation

## 2021-10-16 DIAGNOSIS — R278 Other lack of coordination: Secondary | ICD-10-CM | POA: Insufficient documentation

## 2021-10-16 DIAGNOSIS — R293 Abnormal posture: Secondary | ICD-10-CM | POA: Insufficient documentation

## 2021-10-16 NOTE — Patient Instructions (Addendum)
Segmental mobility of the spine:  Table top wrists under shoudlers, knees under hips, toe extension   Cat: belly button draws up to ceiling, midback hunches, chin tuck Cow: chin away, shoulders blader sink, tailbone up   __  Deep core level 1-2  Handout 2 x DAY AFTER STRETCHES  ___  Dolphin plank against wall  Less arch int he back  10 reps  Feel serratus  ___         Transition from standing to floor  Wide squat in front of a CHAIR  Forearms onto the chair  Lower knees down into double kneeling   floor   To get up Walk on your knees to the front of chair again Forearms onto the chair Inhale, exhale, pushing onto the forearms to life  lifts hips up, kneeping knees bent hands on thighs, then hips then pause HERE  to avoid (moving too quickly up/ blood rush) -->  knees glide forward and roll  Hips up instead of hinging spine up   ___   Car seat modification  Folded towels to fill bucket seat Folded beach towel long ways against back of seat into head rest to fill in the space so your head and back and sacrum aligns  Hands at 9 and 3 o clock  Feet and heel on the mat  Back of the hips against the seat evenly

## 2021-10-17 NOTE — Addendum Note (Signed)
Addended by: Jerl Mina on: 10/17/2021 04:35 PM   Modules accepted: Orders

## 2021-10-17 NOTE — Therapy (Addendum)
OUTPATIENT PHYSICAL THERAPY TREATMENT NOTE/ Recertification   Patient Name: Maria Jennings MRN: [id number] DOB:May 25, 1970, 51 y.o., female Today's Date: 10/17/2021   REFERRING PROVIDER:Sonneberg, MD    PT End of Session - 10/17/21 1537     Visit Number 4    Number of Visits 10    Date for PT Re-Evaluation 12/26/21    PT Start Time 1025    PT Stop Time 1300    PT Time Calculation (min) 55 min    Activity Tolerance Patient tolerated treatment well             Past Medical History:  Diagnosis Date   Lumbago    Multiple sclerosis ([place])    Sinusitis    Vitamin D deficiency    Past Surgical History:  Procedure Laterality Date   CESAREAN SECTION     x 1   KNEE ARTHROSCOPY Right 07/2013   Patient Active Problem List   Diagnosis Date Noted   Acute pain of right shoulder 06/26/2021   Winged scapula 06/26/2021   Nausea 06/26/2021   Allergic rhinitis 11/29/2020   Lipid screening 01/10/2020   Encounter for screening for COVID-19 05/03/2019   Abnormal mammogram 01/15/2018   Vitamin D deficiency 06/11/2016   Atypical chest pain 07/06/2015   DOE (dyspnea on exertion) 07/06/2015   High risk medication use 06/05/2014   Optic neuritis 11/04/2013   Multiple sclerosis ([place]) 11/04/2013   Routine general medical examination at a health care facility 08/16/2012    REFERRING DIAG: B shoulder pain  THERAPY DIAG:  Abnormal posture  Chronic right shoulder pain  Other lack of coordination  Chronic left shoulder pain  Sacrococcygeal disorders, not elsewhere classified  Rationale for Evaluation and Treatment Rehabilitation  PERTINENT HISTORY:  See eval note 07/29/21  PRECAUTIONS: None  SUBJECTIVE:   Pt reported she has not been needing to wear on her L knee the past 3 weeks. Pt has been doing pool walking exercises, climbing [facility] with preventative measures by taking Alleve. Pt had to only stop once to stretch. Pt played tennis without issues.  Pt went roading  biking and noticed tightness / achyness along the SIJ.  Pt noticed last 2 weeks since last session, her L hip and lowback feels  better and she can do more activities for longer duration.   PAIN:  Are you having pain? No  R shoulder tightness    TODAY'S ASSESSMENT/  TREATMENT:    [facility] PT Assessment - 10/17/21 1538       Coordination   Coordination and Movement Description abdominal straining with deep core training, dolphin plank on wall with increased lumbar lordois      Floor to Stand   Comments half kneeling technique and poor pelvic alignment      Other:   Other/ Comments cat cow pose with no stability in feet, upper trap overuse      Palpation   SI assessment  levelled pelvis and shoulder, less hyperextended knees,             [facility] Adult PT Treatment/Exercise - 10/17/21 1538       Therapeutic Activites    Other Therapeutic Activities explained stand<> floor t/f with modifications to promote pelvic stability, stability principles in cat cow , stabilization of scapula an cervical system to promote less overues of upper trap, explained carseat modifications      Neuro Re-ed    Neuro Re-ed Details  cued for alignment and tehcnique to promote pelvic stabiltiy and less upper  trap overuse, less lumbar lordosis               PATIENT EDUCATION: Education details: Showed pt anatomy images. Explained muscles attachments/ connection, physiology of deep core system/ spinal- thoracic-pelvis-lower kinetic chain as they relate to pt's presentation, Sx, and past Hx. Explained what and how these areas of deficits need to be restored to balance and function   Person educated: Patient Education method: Explanation, Demonstration, Tactile cues, Verbal cues, and Handouts Education comprehension: verbalized understanding, returned demonstration, verbal cues required, and tactile cues required   HOME EXERCISE PROGRAM: See pt instruction section       PT Long Term Goals -         PT LONG TERM GOAL #1   Title Pt will improve Lumbar FOTOfrom 47 pts to > 67 pts in order to return to PLOF and return to [employer], tennis, golf, running    Time 10    Period Weeks    Status On-going    Target Date 11/14/21      PT LONG TERM GOAL #2   Title Pt will demo levelled pelvic girdle and shoulder height    Baseline R pelvic girdle lower, R shoulder higher    Time 2    Period Weeks    Status Achieved    Target Date 08/12/21      PT LONG TERM GOAL #3   Title Pt will demo increased cervical mm endurance from 1:30 to > 2:30 before fatigue/ complaints of deltoid ache in order to continue performing work tasks with less upper trap overuse    Time 5    Status On-going    Target Date 11/28/21      PT LONG TERM GOAL #4   Title Pt will demo no perturbation with posterior sling mm test,  activation of multidifis, and increased hip ext from 3/5 > 4/5 B to demo improved deep core strength and posterior chain to minimize mm spasms with functional tasks    Time 8    Period Weeks    Status On-going    Target Date 12/26/21      PT LONG TERM GOAL #5   Title Pt will demo IND with alignment and technique with fitness routine with co-activation of deep core in order to help pt maintain workout routine and progress with UE strengthening without relapse of SIJ / shoulder pain.    Time 10    Period Weeks    Status On-going    Target Date 12/26/21      PT LONG TERM GOAL #6   Title Pt will increase cervical rotation from 30 deg B to > 45 deg in order to improve head turns for driving and mountain biking    Time 6    Period Weeks    Status On-going    Target Date 11/14/21      PT LONG TERM GOAL #7   Title Pt will report sleeping on her back and side 75% of the time in order to avoid sleeping on her belly to mininize asymmetries of her neck and spinal alignment    Time 10    Period Weeks    Status Achieved    Target Date 10/07/21              Plan - 10/17/21 1539      Clinical Impression Statement Pt achieved 2/7 goals and progressing well towards remaining goals. Pt has been able to play tennis, climb [facility] with  preventative measures by taking Alleve.  Pt played tennis without issues.  Pt went roading biking and noticed tightness / achyness along the SIJ.  Pt noticed last 2 weeks since last session, her L hip and lowback feels  better and she can do more activities for longer duration.     Pt demo'd good carry over with more levelled pelvic girdle compared to past sessions.   R shoulder tightness today was addressed with modifications to her self-selected exercises to promote more scapular and cervical stabilization.     Modified cat/ cow yoga pose which pt had stopped doing with her back pain and pt had less pain with modified cat / cow. Modified stand <> floor t/f to promote more pelvic girdle stabiolity instead of using half kneeling which was pt's preferred technique. Pt was cued for less lumbar lordosis and scapular stabilization in new HEP. Progressed to deep core whcih pt required cues for relaxing her global back mm to minimize discomfort at the L/S junction in hooklying. Pt reported her R SIJ felt better after today's session. Pt continues to benefit form skilled PT. Plan to reassess today's new HEP and progress to bird dog modifications and add resistance band cross diagonal planes for HEP at next session      Examination-Activity Limitations Sleep;Other;Lift    Stability/Clinical Decision Making Evolving/Moderate complexity    Rehab Potential Good    PT Frequency 1x / week    PT Duration --   10   PT Treatment/Interventions Stair training;Gait training;Neuromuscular re-education;Functional mobility training;Therapeutic activities;Moist Heat;Therapeutic exercise;Cryotherapy;Manual techniques;Patient/family education;Taping;Energy conservation;Dry needling;ADLs/Self Care Home Management;Traction;Passive range of motion;Scar  mobilization;Joint Manipulations;Splinting    Consulted and Agree with Plan of Care Patient                     Patient will benefit from skilled therapeutic intervention in order to improve the following deficits and impairments:  Decreased activity tolerance, Decreased balance, Decreased safety awareness, Decreased endurance, Decreased strength, Decreased coordination, Improper body mechanics, Pain, Postural dysfunction, Decreased mobility, Hypomobility, Decreased range of motion, Impaired flexibility, Increased muscle spasms, Decreased scar mobility     Jerl Mina, PT 10/17/2021, 3:50 PM

## 2021-10-24 ENCOUNTER — Ambulatory Visit: Payer: 59 | Admitting: Physical Therapy

## 2021-10-24 DIAGNOSIS — G8929 Other chronic pain: Secondary | ICD-10-CM

## 2021-10-24 DIAGNOSIS — R293 Abnormal posture: Secondary | ICD-10-CM

## 2021-10-24 DIAGNOSIS — R278 Other lack of coordination: Secondary | ICD-10-CM | POA: Diagnosis not present

## 2021-10-24 DIAGNOSIS — M25512 Pain in left shoulder: Secondary | ICD-10-CM | POA: Diagnosis not present

## 2021-10-24 DIAGNOSIS — M533 Sacrococcygeal disorders, not elsewhere classified: Secondary | ICD-10-CM | POA: Diagnosis not present

## 2021-10-24 DIAGNOSIS — M25511 Pain in right shoulder: Secondary | ICD-10-CM | POA: Diagnosis not present

## 2021-10-24 NOTE — Patient Instructions (Addendum)
Multifidis twist   Band is on doorknob: stand facing perpendicular to door , firm through 4 points of contact at buttocks and feet. Feet are placed hip with apart.   Twisting trunk without moving the hips and knees Hold band at the level of ribcage, elbows bent,shoulder blades roll back and down like squeezing a pencil under armpit   Exhale twist,.10-15 deg away from door without moving your hips/ knees, press more weight on the side of the sitting bones/ foot opp of your direction of turn as your counterweight. Continue to maintain equal weight through legs.  Keep knee unlocked.  30 reps   __  Dolphin plank and not to lock knees and no flaring of front ribs '  ___  Upper neck stretch by wall   stand perpendicular to the wall, L side to the wall Tilt head to wall  Place L palm at 7 o clock Chin tuck like you are looking into armpit Look at "[place] on giant map  Swivel head with chin tucked to look in upper corner of ceiling as if you are look at  [place] on giant map "   5 reps   Switch direction, R palm on wall at 5 o 'clock   Chin tuck like you are looking into armpit Look at "[place]  on giant map  Swivel head with chin tucked to look in upper corner of ceiling as if you are look at  [facility] on giant map "   5 reps    ___ Practice settling and anchoring in corpse pose after yoga Small rolled hand towel under neck, heels, pillow under knees, Weighted blanket or sand bag over suprapubic/ low ab   ___  Deep core level 1-2  : Practice points of contact , pressure down

## 2021-10-24 NOTE — Therapy (Addendum)
OUTPATIENT PHYSICAL THERAPY TREATMENT NOTE  Patient Name: Maria Jennings MRN: [id number] DOB:Dec 27, 1970, 51 y.o., female Today's Date: 10/24/2021   REFERRING PROVIDER:Sonneberg, MD    PT End of Session - 10/24/21 1105     Visit Number 5    Number of Visits 10    Date for PT Re-Evaluation 12/26/21    PT Start Time 1050    PT Stop Time 1100    PT Time Calculation (min) 10 min    Activity Tolerance Patient tolerated treatment well             Past Medical History:  Diagnosis Date   Lumbago    Multiple sclerosis ([place])    Sinusitis    Vitamin D deficiency    Past Surgical History:  Procedure Laterality Date   CESAREAN SECTION     x 1   KNEE ARTHROSCOPY Right 07/2013   Patient Active Problem List   Diagnosis Date Noted   Acute pain of right shoulder 06/26/2021   Winged scapula 06/26/2021   Nausea 06/26/2021   Allergic rhinitis 11/29/2020   Lipid screening 01/10/2020   Encounter for screening for COVID-19 05/03/2019   Abnormal mammogram 01/15/2018   Vitamin D deficiency 06/11/2016   Atypical chest pain 07/06/2015   DOE (dyspnea on exertion) 07/06/2015   High risk medication use 06/05/2014   Optic neuritis 11/04/2013   Multiple sclerosis ([place]) 11/04/2013   Routine general medical examination at a health care facility 08/16/2012    REFERRING DIAG: B shoulder pain  THERAPY DIAG:  Abnormal posture  Chronic right shoulder pain  Other lack of coordination  Chronic left shoulder pain  Sacrococcygeal disorders, not elsewhere classified  Rationale for Evaluation and Treatment Rehabilitation  PERTINENT HISTORY:  See eval note 07/29/21  PRECAUTIONS: None  SUBJECTIVE:   Pt reported she is still not sleeping on her back. Pt would like to review 5 exercises.     PAIN:  Are you having pain? No      TODAY'S ASSESSMENT/  TREATMENT:   [facility] PT Assessment - 10/24/21 1113       Observation/Other Assessments   Observations T/L lordosis , hyperextended  elbows in quadriped, pt reported back pain in supine with pillow under knees and no pain with weights over suprapubic area      Coordination   Coordination and Movement Description poor segmental movement, poor diassociation between trunk/ pelvis      Posture/Postural Control   Posture Comments pelvic perturbation in deep core levle 1 and 2               [facility] Adult PT Treatment/Exercise - 10/24/21 1113       Neuro Re-ed    Neuro Re-ed Details  cued for sequential coordination and propioception to maintain more postural stability in previous exercises, guided relaxation with stabilization principles to help pt tolerate supine position without back pain                   PATIENT EDUCATION: Education details: provided education for stabilization and relaxation principles, dissociation and segmental movement awareness  Person educated: Patient Education method: Explanation, Demonstration, Tactile cues, Verbal cues, and Handouts Education comprehension: verbalized understanding, returned demonstration, verbal cues required, and tactile cues required   HOME EXERCISE PROGRAM: See pt instruction section       PT Long Term Goals -        PT LONG TERM GOAL #1   Title Pt will improve Lumbar FOTOfrom 47 pts to > 67  pts in order to return to PLOF and return to [employer], tennis, golf, running    Time 10    Period Weeks    Status On-going    Target Date 11/14/21      PT LONG TERM GOAL #2   Title Pt will demo levelled pelvic girdle and shoulder height    Baseline R pelvic girdle lower, R shoulder higher    Time 2    Period Weeks    Status Achieved    Target Date 08/12/21      PT LONG TERM GOAL #3   Title Pt will demo increased cervical mm endurance from 1:30 to > 2:30 before fatigue/ complaints of deltoid ache in order to continue performing work tasks with less upper trap overuse    Time 5    Status On-going    Target Date 11/28/21      PT LONG TERM GOAL  #4   Title Pt will demo no perturbation with posterior sling mm test,  activation of multidifis, and increased hip ext from 3/5 > 4/5 B to demo improved deep core strength and posterior chain to minimize mm spasms with functional tasks    Time 8    Period Weeks    Status On-going    Target Date 12/26/21      PT LONG TERM GOAL #5   Title Pt will demo IND with alignment and technique with fitness routine with co-activation of deep core in order to help pt maintain workout routine and progress with UE strengthening without relapse of SIJ / shoulder pain.    Time 10    Period Weeks    Status On-going    Target Date 12/26/21      PT LONG TERM GOAL #6   Title Pt will increase cervical rotation from 30 deg B to > 45 deg in order to improve head turns for driving and mountain biking    Time 6    Period Weeks    Status On-going    Target Date 11/14/21      PT LONG TERM GOAL #7   Title Pt will report sleeping on her back and side 75% of the time in order to avoid sleeping on her belly to mininize asymmetries of her neck and spinal alignment    Time 10    Period Weeks    Status Achieved    Target Date 10/07/21              Plan -    Clinical Impression Statement Pt required review and cues for proper technique for multidifis twist in standing and also required for less lumbar lordosis, less hyperextended elbows and knees,  and scapular stabilization in past HEP.  Pt responded better with propioception awareness with tactile cues for pressure. Provided relaxation cue and stabilization cues in supine to help pt acclimate to sleeping on back with less back pain. Pt had less back pain with weight bags over supra pubic / groin. Educated pt on integrating restorative yoga principles into her personal routine. Added upper neck stretch today.   Pt continues to benefit form skilled PT. Plan to reassess at next session but will not provide new HEP because pt will be gone for a few weeks after next  session.  At next session, Plan to review CKC for UE in quadriped position to minimize hyperextended elbows which will help with depression of upper and help with her shoulder issues.     Examination-Activity Limitations Sleep;Other;Lift  Stability/Clinical Decision Making Evolving/Moderate complexity    Rehab Potential Good    PT Frequency 1x / week    PT Duration --   10   PT Treatment/Interventions Stair training;Gait training;Neuromuscular re-education;Functional mobility training;Therapeutic activities;Moist Heat;Therapeutic exercise;Cryotherapy;Manual techniques;Patient/family education;Taping;Energy conservation;Dry needling;ADLs/Self Care Home Management;Traction;Passive range of motion;Scar mobilization;Joint Manipulations;Splinting    Consulted and Agree with Plan of Care Patient                     Patient will benefit from skilled therapeutic intervention in order to improve the following deficits and impairments:  Decreased activity tolerance, Decreased balance, Decreased safety awareness, Decreased endurance, Decreased strength, Decreased coordination, Improper body mechanics, Pain, Postural dysfunction, Decreased mobility, Hypomobility, Decreased range of motion, Impaired flexibility, Increased muscle spasms, Decreased scar mobility     Jerl Mina, PT 10/24/2021, 11:06 AM

## 2021-10-29 ENCOUNTER — Other Ambulatory Visit (HOSPITAL_COMMUNITY): Payer: Self-pay

## 2021-10-31 ENCOUNTER — Other Ambulatory Visit (HOSPITAL_COMMUNITY): Payer: Self-pay

## 2021-10-31 ENCOUNTER — Ambulatory Visit: Payer: 59 | Admitting: Physical Therapy

## 2021-10-31 DIAGNOSIS — R278 Other lack of coordination: Secondary | ICD-10-CM | POA: Diagnosis not present

## 2021-10-31 DIAGNOSIS — G8929 Other chronic pain: Secondary | ICD-10-CM | POA: Diagnosis not present

## 2021-10-31 DIAGNOSIS — M533 Sacrococcygeal disorders, not elsewhere classified: Secondary | ICD-10-CM

## 2021-10-31 DIAGNOSIS — M25512 Pain in left shoulder: Secondary | ICD-10-CM | POA: Diagnosis not present

## 2021-10-31 DIAGNOSIS — M25511 Pain in right shoulder: Secondary | ICD-10-CM | POA: Diagnosis not present

## 2021-10-31 DIAGNOSIS — R293 Abnormal posture: Secondary | ICD-10-CM

## 2021-10-31 NOTE — Therapy (Addendum)
OUTPATIENT PHYSICAL THERAPY TREATMENT NOTE Patient Name: Maria Jennings MRN: [id number] DOB:1971-02-02, 51 y.o., female Today's Date: 10/31/2021   REFERRING PROVIDER:Sonneberg, MD    PT End of Session - 10/31/21 0807     Visit Number 6    Number of Visits 10    Date for PT Re-Evaluation 12/26/21    PT Start Time 0801    PT Stop Time 0845    PT Time Calculation (min) 44 min    Activity Tolerance Patient tolerated treatment well             Past Medical History:  Diagnosis Date   Lumbago    Multiple sclerosis ([place])    Sinusitis    Vitamin D deficiency    Past Surgical History:  Procedure Laterality Date   CESAREAN SECTION     x 1   KNEE ARTHROSCOPY Right 07/2013   Patient Active Problem List   Diagnosis Date Noted   Acute pain of right shoulder 06/26/2021   Winged scapula 06/26/2021   Nausea 06/26/2021   Allergic rhinitis 11/29/2020   Lipid screening 01/10/2020   Encounter for screening for COVID-19 05/03/2019   Abnormal mammogram 01/15/2018   Vitamin D deficiency 06/11/2016   Atypical chest pain 07/06/2015   DOE (dyspnea on exertion) 07/06/2015   High risk medication use 06/05/2014   Optic neuritis 11/04/2013   Multiple sclerosis ([place]) 11/04/2013   Routine general medical examination at a health care facility 08/16/2012    REFERRING DIAG: B shoulder pain  THERAPY DIAG:  Abnormal posture  Chronic right shoulder pain  Other lack of coordination  Chronic left shoulder pain  Sacrococcygeal disorders, not elsewhere classified  Rationale for Evaluation and Treatment Rehabilitation  PERTINENT HISTORY:  See eval note 07/29/21  PRECAUTIONS: None  SUBJECTIVE:                 Pt reported she practiced her HEP one time last week. Pt has been able to mountain bike for 40 min, walk 2 miles,  kayak, drive out of town last week but experienced L upper trap tightness and minor LBP. Pt used [place] tool and taping to help her L shoulder.  Pt reports she views  her decreased dependence on taking Alleve will indicate to her that she is getting better. She feels her back is going better but her neck is still an issue. Pt could not turn her neck this morning.       TODAY'S ASSESSMENT/  TREATMENT:   [facility] PT Assessment - 10/31/21 1230       Coordination   Coordination and Movement Description perturbation of pelvis with depe core level 2      AROM   Overall AROM Comments rotation seated cervical " L 40 deg, R 60 deg, Sideflexion L 30 deg, R 35 deg      Palpation   Palpation comment upper trap/ levator tightness B      Bed Mobility   Bed Mobility --   crunch method, neck flexion             [facility] PT Assessment - 10/31/21 1230       Coordination   Coordination and Movement Description perturbation of pelvis with depe core level 2      AROM   Overall AROM Comments rotation seated cervical " L 40 deg, R 60 deg, Sideflexion L 30 deg, R 35 deg      Palpation   Palpation comment upper trap/ levator tightness B  Bed Mobility   Bed Mobility --   crunch method, neck flexion            [facility] Adult PT Treatment/Exercise - 10/31/21 1230       Therapeutic Activites    Other Therapeutic Activities guided relaxation, educated the importance of ending her yoga practice with downregulation practice to optimize parasympathetic system to minimize upper trap overuse , recorded neck HEP and deep core level 1-2, multidifis on her phone per her consent for compliance      Neuro Re-ed    Neuro Re-ed Details  cued for relaxation practices, neck AROM, multidifis technique               [facility] Adult PT Treatment/Exercise - 10/31/21 1230       Therapeutic Activites    Other Therapeutic Activities guided relaxation, educated the importance of ending her yoga practice with downregulation practice to optimize parasympathetic system to minimize upper trap overuse , recorded neck HEP and deep core level 1-2, multidifis on her phone per her  consent for compliance      Neuro Re-ed    Neuro Re-ed Details  cued for relaxation practices, neck AROM, multidifis technique                PATIENT EDUCATION: Education details: provided education for stabilization and relaxation principles, dissociation and segmental movement awareness  Person educated: Patient Education method: Explanation, Demonstration, Tactile cues, Verbal cues, and Handouts Education comprehension: verbalized understanding, returned demonstration, verbal cues required, and tactile cues required   HOME EXERCISE PROGRAM: See pt instruction section       PT Long Term Goals -        PT LONG TERM GOAL #1   Title Pt will improve Lumbar FOTOfrom 47 pts to > 67 pts in order to return to PLOF and return to [employer], tennis, golf, running    Time 10    Period Weeks    Status On-going    Target Date 11/14/21      PT LONG TERM GOAL #2   Title Pt will demo levelled pelvic girdle and shoulder height    Baseline R pelvic girdle lower, R shoulder higher    Time 2    Period Weeks    Status Achieved    Target Date 08/12/21      PT LONG TERM GOAL #3   Title Pt will demo increased cervical mm endurance from 1:30 to > 2:30 before fatigue/ complaints of deltoid ache in order to continue performing work tasks with less upper trap overuse    Time 5    Status On-going    Target Date 11/28/21      PT LONG TERM GOAL #4   Title Pt will demo no perturbation with posterior sling mm test,  activation of multidifis, and increased hip ext from 3/5 > 4/5 B to demo improved deep core strength and posterior chain to minimize mm spasms with functional tasks    Time 8    Period Weeks    Status On-going    Target Date 12/26/21      PT LONG TERM GOAL #5   Title Pt will demo IND with alignment and technique with fitness routine with co-activation of deep core in order to help pt maintain workout routine and progress with UE strengthening without relapse of SIJ /  shoulder pain.    Time 10    Period Weeks    Status On-going    Target Date  12/26/21      PT LONG TERM GOAL #6   Title Pt will increase cervical rotation from 30 deg B to > 45 deg in order to improve head turns for driving and mountain biking    Time 6    Period Weeks    Status On-going    Target Date 11/14/21      PT LONG TERM GOAL #7   Title Pt will report sleeping on her back and side 75% of the time in order to avoid sleeping on her belly to mininize asymmetries of her neck and spinal alignment    Time 10    Period Weeks    Status Achieved    Target Date 10/07/21              Plan -     Clinical Impression Statement Pt showed levelled pelvic girdle alignment which is good carry over for the past weeks.  Pt's R shoulder was slightly higher.   Pt required review and cues for proper technique for multidifis twist in standing and for deep core level 2.   Pt was provided relaxation practices and explained the importance of adding relaxation practice at the end of her yoga practice to help minimize neck mm tensions and bring balance to parasympathetic system given her busy schedule. Also this practice will help pt acclimate to sleeping on her back. Pt was shown the use of small rolled towel to support neck with new HEP AROM for minimizing neck mm tensions.  Following training today, pt reported she noticed her "L neck is not as loud."   Pt had no LBP today with laying in supine with pillow under knees/ calf for the duration of her session which indicates more improvement because she reported LBP in this position in the past sessions.   Pt continues to benefit from skilled PT. Plan to add cross diagonal HEP at upcoming sessions to continue with diassociation/propioception training.    Plan to reassess cervical ROM.     Examination-Activity Limitations Sleep;Other;Lift    Stability/Clinical Decision Making Evolving/Moderate complexity    Rehab Potential Good    PT Frequency 1x  / week    PT Duration --   10   PT Treatment/Interventions Stair training;Gait training;Neuromuscular re-education;Functional mobility training;Therapeutic activities;Moist Heat;Therapeutic exercise;Cryotherapy;Manual techniques;Patient/family education;Taping;Energy conservation;Dry needling;ADLs/Self Care Home Management;Traction;Passive range of motion;Scar mobilization;Joint Manipulations;Splinting    Consulted and Agree with Plan of Care Patient                     Patient will benefit from skilled therapeutic intervention in order to improve the following deficits and impairments:  Decreased activity tolerance, Decreased balance, Decreased safety awareness, Decreased endurance, Decreased strength, Decreased coordination, Improper body mechanics, Pain, Postural dysfunction, Decreased mobility, Hypomobility, Decreased range of motion, Impaired flexibility, Increased muscle spasms, Decreased scar mobility     Jerl Mina, PT 10/31/2021, 12:37 PM

## 2021-11-05 ENCOUNTER — Other Ambulatory Visit (HOSPITAL_COMMUNITY): Payer: Self-pay

## 2021-11-19 ENCOUNTER — Ambulatory Visit: Payer: 59 | Admitting: Physical Therapy

## 2021-11-26 ENCOUNTER — Ambulatory Visit: Payer: 59 | Attending: Family Medicine | Admitting: Physical Therapy

## 2021-11-26 DIAGNOSIS — M25512 Pain in left shoulder: Secondary | ICD-10-CM | POA: Insufficient documentation

## 2021-11-26 DIAGNOSIS — M25511 Pain in right shoulder: Secondary | ICD-10-CM | POA: Diagnosis not present

## 2021-11-26 DIAGNOSIS — R278 Other lack of coordination: Secondary | ICD-10-CM | POA: Insufficient documentation

## 2021-11-26 DIAGNOSIS — G8929 Other chronic pain: Secondary | ICD-10-CM | POA: Insufficient documentation

## 2021-11-26 DIAGNOSIS — R293 Abnormal posture: Secondary | ICD-10-CM | POA: Insufficient documentation

## 2021-11-26 DIAGNOSIS — M533 Sacrococcygeal disorders, not elsewhere classified: Secondary | ICD-10-CM | POA: Insufficient documentation

## 2021-11-26 NOTE — Therapy (Addendum)
OUTPATIENT PHYSICAL THERAPY TREATMENT NOTE   Patient Name: Maria Jennings MRN: [id number] DOB:April 05, 1971, 51 y.o., female Today's Date: 11/26/2021   REFERRING PROVIDER:Sonneberg, MD    PT End of Session - 11/26/21 1644     Visit Number 7    Number of Visits 10    Date for PT Re-Evaluation 12/26/21    PT Start Time 16    PT Stop Time 1715    PT Time Calculation (min) 45 min    Activity Tolerance Patient tolerated treatment well             Past Medical History:  Diagnosis Date   Lumbago    Multiple sclerosis ([place])    Sinusitis    Vitamin D deficiency    Past Surgical History:  Procedure Laterality Date   CESAREAN SECTION     x 1   KNEE ARTHROSCOPY Right 07/2013   Patient Active Problem List   Diagnosis Date Noted   Acute pain of right shoulder 06/26/2021   Winged scapula 06/26/2021   Nausea 06/26/2021   Allergic rhinitis 11/29/2020   Lipid screening 01/10/2020   Encounter for screening for COVID-19 05/03/2019   Abnormal mammogram 01/15/2018   Vitamin D deficiency 06/11/2016   Atypical chest pain 07/06/2015   DOE (dyspnea on exertion) 07/06/2015   High risk medication use 06/05/2014   Optic neuritis 11/04/2013   Multiple sclerosis ([place]) 11/04/2013   Routine general medical examination at a health care facility 08/16/2012    REFERRING DIAG: B shoulder pain  THERAPY DIAG:  Abnormal posture  Other lack of coordination  Chronic right shoulder pain  Chronic left shoulder pain  Sacrococcygeal disorders, not elsewhere classified  Rationale for Evaluation and Treatment Rehabilitation  PERTINENT HISTORY:  See eval note 07/29/21  PRECAUTIONS: None  SUBJECTIVE:                 Pt feels stronger. Pt feels weak with lifting. Pt's neck feels better since vacation and with 6 directions of the neck. Pt drove 8-12 horus without her back and neck bothering her on her vacation for 2 weeks     TODAY'S ASSESSMENT/  TREATMENT:   [facility] PT Assessment -  11/26/21 1646       Strength   Overall Strength Comments scaption / opp hip ext  4/5  , no pertubration with RUE/ L hip ext ,   single UE support, PF MMT 4/5 poor eccentrol control, 15 rep L, 10 rep R              [facility] Adult PT Treatment/Exercise - 11/26/21 1819       Therapeutic Activites    Other Therapeutic Activities explained role of eccentrc control of PF and transversearch for trail running, importance of maintaining deep core and multidifis, and modifyication to her pool workout routine      Neuro Re-ed    Neuro Re-ed Details  excesive cues for less toe gripping, more ankle stability, eccentric control of plantar fascia in dynamic movements to prep pt to trail running                 PATIENT EDUCATION: Education details: provided education for stabilization and relaxation principles, dissociation and segmental movement awareness  Person educated: Patient Education method: Explanation, Demonstration, Tactile cues, Verbal cues, and Handouts Education comprehension: verbalized understanding, returned demonstration, verbal cues required, and tactile cues required   HOME EXERCISE PROGRAM: See pt instruction section       PT Long Term Goals -  PT LONG TERM GOAL #1   Title Pt will improve Lumbar FOTOfrom 47 pts to > 67 pts in order to return to PLOF and return to [employer], tennis, golf, running    Time 10    Period Weeks    Status On-going    Target Date 11/14/21      PT LONG TERM GOAL #2   Title Pt will demo levelled pelvic girdle and shoulder height    Baseline R pelvic girdle lower, R shoulder higher    Time 2    Period Weeks    Status Achieved    Target Date 08/12/21      PT LONG TERM GOAL #3   Title Pt will demo increased cervical mm endurance from 1:30 to > 2:30 before fatigue/ complaints of deltoid ache in order to continue performing work tasks with less upper trap overuse    Time 5    Status On-going    Target Date 11/28/21       PT LONG TERM GOAL #4   Title Pt will demo no perturbation with posterior sling mm test,  activation of multidifis, and increased hip ext from 3/5 > 4/5 B to demo improved deep core strength and posterior chain to minimize mm spasms with functional tasks    Time 8    Period Weeks    Status On-going    Target Date 12/26/21      PT LONG TERM GOAL #5   Title Pt will demo IND with alignment and technique with fitness routine with co-activation of deep core in order to help pt maintain workout routine and progress with UE strengthening without relapse of SIJ / shoulder pain.    Time 10    Period Weeks    Status On-going    Target Date 12/26/21      PT LONG TERM GOAL #6   Title Pt will increase cervical rotation from 30 deg B to > 45 deg in order to improve head turns for driving and mountain biking    Time 6    Period Weeks    Status On-going    Target Date 11/14/21      PT LONG TERM GOAL #7   Title Pt will report sleeping on her back and side 75% of the time in order to avoid sleeping on her belly to mininize asymmetries of her neck and spinal alignment    Time 10    Period Weeks    Status Achieved    Target Date 10/07/21              Plan     Clinical Impression Statement Pt showed less pertubation of trunk in posterior sling mm test and also demo'd less lumbar lordosis.   Encouraged pt t continue with deep core and multidifis strengthening HEP  Added modifications to pool routine to increase propioception of feet and less toe gripping.  Provided cues for more eccentric control of plantar fascia to increase propulsion and landing in preparation for trail running which is one of her goals.  Pt continues to benefit from skilled PT. Plan to add cross diagonal HEP at upcoming sessions to continue with diassociation/propioception training.        Examination-Activity Limitations Sleep;Other;Lift    Stability/Clinical Decision Making Evolving/Moderate complexity    Rehab  Potential Good    PT Frequency 1x / week    PT Duration --   10   PT Treatment/Interventions Stair training;Gait training;Neuromuscular re-education;Functional mobility training;Therapeutic activities;Moist  Heat;Therapeutic exercise;Cryotherapy;Manual techniques;Patient/family education;Taping;Energy conservation;Dry needling;ADLs/Self Care Home Management;Traction;Passive range of motion;Scar mobilization;Joint Manipulations;Splinting    Consulted and Agree with Plan of Care Patient                     Patient will benefit from skilled therapeutic intervention in order to improve the following deficits and impairments:  Decreased activity tolerance, Decreased balance, Decreased safety awareness, Decreased endurance, Decreased strength, Decreased coordination, Improper body mechanics, Pain, Postural dysfunction, Decreased mobility, Hypomobility, Decreased range of motion, Impaired flexibility, Increased muscle spasms, Decreased scar mobility     Jerl Mina, PT 11/26/2021, 6:25 PM

## 2021-11-26 NOTE — Patient Instructions (Signed)
Feet awareness  - transverse arch activation  Less toe gripping in the following exercises   - heel raises with hand on counter ( keep COM forward and less heel)  20 reps   -backward lunges - front knee and center of mass forward, back toes bent ( calf stretches)  _pool squats , side step,    -backward walking with center of mass over knees , not go back with it for propulsion

## 2021-11-28 ENCOUNTER — Other Ambulatory Visit (HOSPITAL_COMMUNITY): Payer: Self-pay

## 2021-12-04 ENCOUNTER — Other Ambulatory Visit (HOSPITAL_COMMUNITY): Payer: Self-pay

## 2021-12-10 ENCOUNTER — Ambulatory Visit: Payer: 59 | Admitting: Physical Therapy

## 2021-12-10 DIAGNOSIS — M533 Sacrococcygeal disorders, not elsewhere classified: Secondary | ICD-10-CM | POA: Diagnosis not present

## 2021-12-10 DIAGNOSIS — G8929 Other chronic pain: Secondary | ICD-10-CM | POA: Diagnosis not present

## 2021-12-10 DIAGNOSIS — M25512 Pain in left shoulder: Secondary | ICD-10-CM | POA: Diagnosis not present

## 2021-12-10 DIAGNOSIS — R278 Other lack of coordination: Secondary | ICD-10-CM | POA: Diagnosis not present

## 2021-12-10 DIAGNOSIS — R293 Abnormal posture: Secondary | ICD-10-CM

## 2021-12-10 DIAGNOSIS — M25511 Pain in right shoulder: Secondary | ICD-10-CM | POA: Diagnosis not present

## 2021-12-10 NOTE — Therapy (Addendum)
OUTPATIENT PHYSICAL THERAPY TREATMENT NOTE   Patient Name: Maria Jennings MRN: [id number] DOB:[birth year], 51 y.o., female Today's Date: 12/10/2021   REFERRING PROVIDER:Sonneberg, MD    PT End of Session - 12/10/21 1637     Visit Number 8    Number of Visits 10    Date for PT Re-Evaluation 12/26/21    PT Start Time 8413    PT Stop Time 1715    PT Time Calculation (min) 42 min    Activity Tolerance Patient tolerated treatment well             Past Medical History:  Diagnosis Date   Lumbago    Multiple sclerosis ([place])    Sinusitis    Vitamin D deficiency    Past Surgical History:  Procedure Laterality Date   CESAREAN SECTION     x 1   KNEE ARTHROSCOPY Right 07/2013   Patient Active Problem List   Diagnosis Date Noted   Acute pain of right shoulder 06/26/2021   Winged scapula 06/26/2021   Nausea 06/26/2021   Allergic rhinitis 11/29/2020   Lipid screening 01/10/2020   Encounter for screening for COVID-19 05/03/2019   Abnormal mammogram 01/15/2018   Vitamin D deficiency 06/11/2016   Atypical chest pain 07/06/2015   DOE (dyspnea on exertion) 07/06/2015   High risk medication use 06/05/2014   Optic neuritis 11/04/2013   Multiple sclerosis ([place]) 11/04/2013   Routine general medical examination at a health care facility 08/16/2012    REFERRING DIAG: B shoulder pain  THERAPY DIAG:  Abnormal posture  Other lack of coordination  Chronic right shoulder pain  Chronic left shoulder pain  Sacrococcygeal disorders, not elsewhere classified  Rationale for Evaluation and Treatment Rehabilitation  PERTINENT HISTORY:  See eval note 07/29/21  PRECAUTIONS: None  SUBJECTIVE:                  Pt feels weak with lifting and overuses her forearms and upper trap. Pt is sleeping for a longer period of time  and  she had not done that since 2006 and she had no pain with it.    TODAY'S ASSESSMENT/  TREATMENT:    [facility] PT Assessment - 12/11/21 0918       Coordination    Coordination and Movement Description delayed downward rotation of R scapula with shoulder adduction , compared to L, poor sequential coordination with multidifis twist with poor diassociation nand demo'd upper trap overuse, cued for more eccentric control of lower kinetic chain and propiocetion in new HEP with resistance band at waist and backward/ forward stepping      Palpation   Palpation comment tightness/ tenderness along R posterior UE quadrant, intercostals/ rhomboids, midtrap, paraspinals, T5-7, T10 hypomobile, slightly deviated T7-10 to R,             [facility] Adult PT Treatment/Exercise - 12/11/21 0920       Neuro Re-ed    Neuro Re-ed Details  excessive cues to correct the following deficits: delayed downward rotation of R scapula with shoulder adduction , compared to L, poor sequential coordination with multidifis twist with poor diassociation nand demo'd upper trap overuse, cued for more eccentric control of lower kinetic chain and propiocetion in new HEP with resistance band at waist and backward/ forward stepping      Modalities   Modalities Moist Heat      Moist Heat Therapy   Number Minutes Moist Heat 15 Minutes    Moist Heat Location --   C/T junction,  during treatment with  5 min  unbilled     Manual Therapy   Manual therapy comments STM/MWM at problem areas noted in assesssment to minimzie R scapular protraction, optimize scapular retraction depression, alignment of thoracic segments, minimize shoulder and neck tensions,                PATIENT EDUCATION: Education details: provided education for stabilization and relaxation principles, dissociation and segmental movement awareness  Person educated: Patient Education method: Explanation, Demonstration, Tactile cues, Verbal cues, and Handouts Education comprehension: verbalized understanding, returned demonstration, verbal cues required, and tactile cues required   HOME EXERCISE PROGRAM: See pt instruction  section       PT Long Term Goals -        PT LONG TERM GOAL #1   Title Pt will improve Lumbar FOTOfrom 47 pts to > 67 pts in order to return to PLOF and return to [employer], tennis, golf, running    Time 10    Period Weeks    Status On-going    Target Date 11/14/21      PT LONG TERM GOAL #2   Title Pt will demo levelled pelvic girdle and shoulder height    Baseline R pelvic girdle lower, R shoulder higher    Time 2    Period Weeks    Status Achieved    Target Date 08/12/21      PT LONG TERM GOAL #3   Title Pt will demo increased cervical mm endurance from 1:30 to > 2:30 before fatigue/ complaints of deltoid ache in order to continue performing work tasks with less upper trap overuse    Time 5    Status On-going    Target Date 11/28/21      PT LONG TERM GOAL #4   Title Pt will demo no perturbation with posterior sling mm test,  activation of multidifis, and increased hip ext from 3/5 > 4/5 B to demo improved deep core strength and posterior chain to minimize mm spasms with functional tasks    Time 8    Period Weeks    Status On-going    Target Date 12/26/21      PT LONG TERM GOAL #5   Title Pt will demo IND with alignment and technique with fitness routine with co-activation of deep core in order to help pt maintain workout routine and progress with UE strengthening without relapse of SIJ / shoulder pain.    Time 10    Period Weeks    Status On-going    Target Date 12/26/21      PT LONG TERM GOAL #6   Title Pt will increase cervical rotation from 30 deg B to > 45 deg in order to improve head turns for driving and mountain biking    Time 6    Period Weeks    Status On-going    Target Date 11/14/21      PT LONG TERM GOAL #7   Title Pt will report sleeping on her back and side 75% of the time in order to avoid sleeping on her belly to mininize asymmetries of her neck and spinal alignment    Time 10    Period Weeks    Status Achieved    Target Date 10/07/21               Plan -    Clinical Impression Statement Pt reached a milestone improvement as she reported she is sleeping longer period of time on  her back and  she had not done that since 2006 and she had no pain with it.  Focused to day per pt's request to address tightness in her R UE quadrant. Following Tx today which pt tolerated without pain, pt showed improved mobility , reported less tightness. Pt required excessive cues to minimize upper trap overuse in multidifis exercise and eccentric control and propioception of lower kinetic chain exercise.  Reinforced the importance of maintaining R trunk rotation HEP to counteract the overuse of her R UE with work tasks and minimize protraction of scapula.   Pt continues to benefit from skilled PT. Plan to add cross diagonal HEP at upcoming sessions to continue with diassociation/propioception training. Also plan to continue to prepare pt to running  and provide pt alternative workout routine to replace her pool workout.        Examination-Activity Limitations Sleep;Other;Lift    Stability/Clinical Decision Making Evolving/Moderate complexity    Rehab Potential Good    PT Frequency 1x / week    PT Duration --   10   PT Treatment/Interventions Stair training;Gait training;Neuromuscular re-education;Functional mobility training;Therapeutic activities;Moist Heat;Therapeutic exercise;Cryotherapy;Manual techniques;Patient/family education;Taping;Energy conservation;Dry needling;ADLs/Self Care Home Management;Traction;Passive range of motion;Scar mobilization;Joint Manipulations;Splinting    Consulted and Agree with Plan of Care Patient                     Patient will benefit from skilled therapeutic intervention in order to improve the following deficits and impairments:  Decreased activity tolerance, Decreased balance, Decreased safety awareness, Decreased endurance, Decreased strength, Decreased coordination, Improper body mechanics,  Pain, Postural dysfunction, Decreased mobility, Hypomobility, Decreased range of motion, Impaired flexibility, Increased muscle spasms, Decreased scar mobility     Jerl Mina, PT 12/10/2021, 4:38 PM

## 2021-12-10 NOTE — Patient Instructions (Signed)
    WALKING WITH RESISTANCE BLUE Band at waist connected to doorknob 63mns Stepping forward normal length steps, planting mid and forefoot down, center of mass ( navel) leans forward slightly as if you were walking uphill 3-4 steps till band feels taut ( MAKE SURE THE DOOR IS LOCKED AND WON'T OPEN)   Stepping backwards, lower heel slowly, carry trunk and hips back , leaning forward, front knee along 2-3 rd toe line    2 min  ____  Multifidis twist   Band is on doorknob: sit facing perpendicular to door , sit halfway towards front of chair, firm through 4 points of contact at buttocks and feet. Feet are placed hip with apart.   Twisting trunk without moving the hips and knees Hold band at the level of ribcage, elbows bent,shoulder blades roll back and down like squeezing a pencil under armpit   Exhale twist,.10-15 deg away from door without moving your hips/ knees, press more weight on the side of the sitting bones/ foot opp of your direction of turn as your counterweight. Continue to maintain equal weight through legs.  Keep knee unlocked.  30 reps

## 2021-12-24 ENCOUNTER — Ambulatory Visit: Payer: 59 | Attending: Family Medicine | Admitting: Physical Therapy

## 2021-12-24 DIAGNOSIS — M25512 Pain in left shoulder: Secondary | ICD-10-CM | POA: Insufficient documentation

## 2021-12-24 DIAGNOSIS — G8929 Other chronic pain: Secondary | ICD-10-CM | POA: Insufficient documentation

## 2021-12-24 DIAGNOSIS — M533 Sacrococcygeal disorders, not elsewhere classified: Secondary | ICD-10-CM | POA: Insufficient documentation

## 2021-12-24 DIAGNOSIS — R278 Other lack of coordination: Secondary | ICD-10-CM | POA: Diagnosis not present

## 2021-12-24 DIAGNOSIS — M25511 Pain in right shoulder: Secondary | ICD-10-CM | POA: Insufficient documentation

## 2021-12-24 DIAGNOSIS — R293 Abnormal posture: Secondary | ICD-10-CM | POA: Insufficient documentation

## 2021-12-24 NOTE — Therapy (Addendum)
OUTPATIENT PHYSICAL THERAPY TREATMENT NOTE   Patient Name: Maria Jennings MRN: [id number] DOB:Feb 09, 1971, 51 y.o., female Today's Date: 12/24/2021   REFERRING PROVIDER:Sonneberg, MD    PT End of Session - 12/24/21 1107     Visit Number 9    Number of Visits 10    Date for PT Re-Evaluation 12/26/21    PT Start Time 1107    PT Stop Time 1155    PT Time Calculation (min) 48 min    Activity Tolerance Patient tolerated treatment well             Past Medical History:  Diagnosis Date   Lumbago    Multiple sclerosis ([place])    Sinusitis    Vitamin D deficiency    Past Surgical History:  Procedure Laterality Date   CESAREAN SECTION     x 1   KNEE ARTHROSCOPY Right 07/2013   Patient Active Problem List   Diagnosis Date Noted   Acute pain of right shoulder 06/26/2021   Winged scapula 06/26/2021   Nausea 06/26/2021   Allergic rhinitis 11/29/2020   Lipid screening 01/10/2020   Encounter for screening for COVID-19 05/03/2019   Abnormal mammogram 01/15/2018   Vitamin D deficiency 06/11/2016   Atypical chest pain 07/06/2015   DOE (dyspnea on exertion) 07/06/2015   High risk medication use 06/05/2014   Optic neuritis 11/04/2013   Multiple sclerosis ([place]) 11/04/2013   Routine general medical examination at a health care facility 08/16/2012    REFERRING DIAG: B shoulder pain  THERAPY DIAG:  Abnormal posture  Other lack of coordination  Chronic right shoulder pain  Chronic left shoulder pain  Sacrococcygeal disorders, not elsewhere classified  Rationale for Evaluation and Treatment Rehabilitation  PERTINENT HISTORY:  See eval note 07/29/21  PRECAUTIONS: None  SUBJECTIVE:  "one good with HEP - don't know when I do side ways lunges- tight above my L hip - and walking with more than one day [place] husband that is 8f 4 - I tighten up on L glued and hip bone/side - longer stride? - trials better; IF you can do what you have done on my R thoracici - check my  left please :) - R done much better - the TOS one good with HEP - don't know when I do side ways lunges- tight above my L hip - and walking with more than one day [place] husband that is [id number] - I tighten up on L glued and hip bone/side - longer stride? - trials better; IF you can do what you have done on my R thoracici - check my left please :) - R done much better - the TOS "  "R midback and shoulder has been great since last Tx. Pt is sleeping on her back 1/3 of the night."   TODAY'S ASSESSMENT/  TREATMENT:   [facility]       Coordination   Coordination and Movement Description scapular dyskinesis no longer present   chest breathing     Other:   Other/ Comments bridging with poor technique,      Palpation   Spinal mobility T12 deviated to L,  L 2 deviated to L/ hypomobile, tightness along scapular mm L    SI assessment  levelled pelvis girdle, R SIJ hypomobilty    Palpation comment tightness along intercostals T10-12 anterior/ lateral             [facility] PT Assessment - 12/24/21  1111       Coordination   Coordination and Movement Description scapular dyskinesis no longer present   chest breathing     Other:   Other/ Comments bridging with poor technique,      Palpation   Spinal mobility T12 deviated to L,  L 2 deviated to L/ hypomobile, tightness along scapular mm L    SI assessment  levelled pelvis girdle, R SIJ hypomobilty    Palpation comment tightness along intercostals T10-12 anterior/ lateral             [facility] Adult PT Treatment/Exercise - 12/24/21 1135       Therapeutic Activites    Other Therapeutic Activities discussed performing complimentary stretches to paddle board / golf activities to minimize L sided tensions at neck and midback      Neuro Re-ed    Neuro Re-ed Details  with cues for proper bridge technique.      Modalities   Modalities Moist Heat      Moist Heat Therapy   Number Minutes Moist Heat 15 Minutes  ( 5 min  not billed)    Moist Heat Location --   thoracic/ back, legs propped elevated, while receiving manual Tx at scapular mm L     Manual Therapy   Manual therapy comments STM/MWM at problem areas noted in assesssment to minimzie R scapular protraction, optimize scapular retraction depression, alignment of lumabr,  thoracic segments, minimize shoulder and neck tensions,                 PATIENT EDUCATION: Education details: provided education for stabilization and relaxation principles, dissociation and segmental movement awareness  Person educated: Patient Education method: Explanation, Demonstration, Tactile cues, Verbal cues, and Handouts Education comprehension: verbalized understanding, returned demonstration, verbal cues required, and tactile cues required   HOME EXERCISE PROGRAM: See pt instruction section       PT Long Term Goals -        PT LONG TERM GOAL #1   Title Pt will improve Lumbar FOTOfrom 47 pts to > 67 pts in order to return to PLOF and return to [employer], tennis, golf, running    Time 10    Period Weeks    Status On-going    Target Date 11/14/21      PT LONG TERM GOAL #2   Title Pt will demo levelled pelvic girdle and shoulder height    Baseline R pelvic girdle lower, R shoulder higher    Time 2    Period Weeks    Status Achieved    Target Date 08/12/21      PT LONG TERM GOAL #3   Title Pt will demo increased cervical mm endurance from 1:30 to > 2:30 before fatigue/ complaints of deltoid ache in order to continue performing work tasks with less upper trap overuse    Time 5    Status On-going    Target Date 11/28/21      PT LONG TERM GOAL #4   Title Pt will demo no perturbation with posterior sling mm test,  activation of multidifis, and increased hip ext from 3/5 > 4/5 B to demo improved deep core strength and posterior chain to minimize mm spasms with functional tasks    Time 8    Period Weeks    Status On-going    Target Date 12/26/21       PT LONG TERM GOAL #5   Title Pt will demo IND with alignment and technique with  fitness routine with co-activation of deep core in order to help pt maintain workout routine and progress with UE strengthening without relapse of SIJ / shoulder pain.    Time 10    Period Weeks    Status On-going    Target Date 12/26/21      PT LONG TERM GOAL #6   Title Pt will increase cervical rotation from 30 deg B to > 45 deg in order to improve head turns for driving and mountain biking    Time 6    Period Weeks    Status On-going    Target Date 11/14/21      PT LONG TERM GOAL #7   Title Pt will report sleeping on her back and side 75% of the time in order to avoid sleeping on her belly to mininize asymmetries of her neck and spinal alignment    Time 10    Period Weeks    Status Achieved    Target Date 10/07/21              Plan - 10/17/21 1539     Clinical Impression Statement R upper quadrant tensions and complaints have improved. Pt continues to increase her time sleeping on her back.   Pt achieved a milestone improvement with ability to perform bridges without pain today after cues for proper technique.   Focused today per pt's request to address tightness in her L UE quadrant. Following Tx today which pt tolerated without pain, pt showed improved mobility , reported less tightness. Pt showed less deviated segments along thoracic / lumbar spine. Advised pt perform compliment stretches to paddle boarding /golfing to minimize tensions on L upper quadrant.   Pt continues to benefit from skilled PT.  Plan to perform recert/ progress notes at next session.   Plan to add cross diagonal HEP at upcoming sessions to continue with diassociation/propioception training.   Also plan to continue to prepare pt to running  and provide pt alternative workout routine to replace her pool workout.        Examination-Activity Limitations Sleep;Other;Lift    Stability/Clinical Decision Making  Evolving/Moderate complexity    Rehab Potential Good    PT Frequency 1x / week    PT Duration --   10   PT Treatment/Interventions Stair training;Gait training;Neuromuscular re-education;Functional mobility training;Therapeutic activities;Moist Heat;Therapeutic exercise;Cryotherapy;Manual techniques;Patient/family education;Taping;Energy conservation;Dry needling;ADLs/Self Care Home Management;Traction;Passive range of motion;Scar mobilization;Joint Manipulations;Splinting    Consulted and Agree with Plan of Care Patient                     Patient will benefit from skilled therapeutic intervention in order to improve the following deficits and impairments:  Decreased activity tolerance, Decreased balance, Decreased safety awareness, Decreased endurance, Decreased strength, Decreased coordination, Improper body mechanics, Pain, Postural dysfunction, Decreased mobility, Hypomobility, Decreased range of motion, Impaired flexibility, Increased muscle spasms, Decreased scar mobility     Jerl Mina, PT 12/24/2021, 12:03 PM

## 2021-12-24 NOTE — Patient Instructions (Signed)
Bridging 10 reps with points of contact, more deep core initiating lift of gluts  __  Complimentary stretches to paddle board to minimize tensions on L

## 2021-12-26 ENCOUNTER — Other Ambulatory Visit (HOSPITAL_COMMUNITY): Payer: Self-pay

## 2021-12-30 ENCOUNTER — Other Ambulatory Visit (HOSPITAL_COMMUNITY): Payer: Self-pay

## 2022-01-01 ENCOUNTER — Other Ambulatory Visit (HOSPITAL_COMMUNITY): Payer: Self-pay

## 2022-01-03 ENCOUNTER — Other Ambulatory Visit (HOSPITAL_COMMUNITY): Payer: Self-pay

## 2022-01-07 ENCOUNTER — Ambulatory Visit: Payer: 59 | Admitting: Physical Therapy

## 2022-01-07 DIAGNOSIS — G8929 Other chronic pain: Secondary | ICD-10-CM

## 2022-01-07 DIAGNOSIS — M25512 Pain in left shoulder: Secondary | ICD-10-CM | POA: Diagnosis not present

## 2022-01-07 DIAGNOSIS — M533 Sacrococcygeal disorders, not elsewhere classified: Secondary | ICD-10-CM | POA: Diagnosis not present

## 2022-01-07 DIAGNOSIS — R278 Other lack of coordination: Secondary | ICD-10-CM

## 2022-01-07 DIAGNOSIS — R293 Abnormal posture: Secondary | ICD-10-CM | POA: Diagnosis not present

## 2022-01-07 DIAGNOSIS — M25511 Pain in right shoulder: Secondary | ICD-10-CM | POA: Diagnosis not present

## 2022-01-07 NOTE — Therapy (Addendum)
OUTPATIENT PHYSICAL THERAPY TREATMENT NOTE    Progress Note reporting from 07/29/21 to 4/31/54   Recert    Patient Name: Maria Jennings MRN: [id number] DOB:08-Oct-1970, 51 y.o., female Today's Date: 01/07/2022   REFERRING PROVIDER:Sonneberg, MD    PT End of Session - 01/07/22 1634     Visit Number 10    Number of Visits 20    Date for PT Re-Evaluation 03/18/22    Activity Tolerance Patient tolerated treatment well             Past Medical History:  Diagnosis Date   Lumbago    Multiple sclerosis ([place])    Sinusitis    Vitamin D deficiency    Past Surgical History:  Procedure Laterality Date   CESAREAN SECTION     x 1   KNEE ARTHROSCOPY Right 07/2013   Patient Active Problem List   Diagnosis Date Noted   Acute pain of right shoulder 06/26/2021   Winged scapula 06/26/2021   Nausea 06/26/2021   Allergic rhinitis 11/29/2020   Lipid screening 01/10/2020   Encounter for screening for COVID-19 05/03/2019   Abnormal mammogram 01/15/2018   Vitamin D deficiency 06/11/2016   Atypical chest pain 07/06/2015   DOE (dyspnea on exertion) 07/06/2015   High risk medication use 06/05/2014   Optic neuritis 11/04/2013   Multiple sclerosis ([place]) 11/04/2013   Routine general medical examination at a health care facility 08/16/2012    REFERRING DIAG: B shoulder pain  THERAPY DIAG:  Abnormal posture  Other lack of coordination  Chronic right shoulder pain  Chronic left shoulder pain  Sacrococcygeal disorders, not elsewhere classified  Rationale for Evaluation and Treatment Rehabilitation  PERTINENT HISTORY:  See eval note 07/29/21  PRECAUTIONS: None  SUBJECTIVE:  Two weeks ago, Pt reported she walked for 1 hour and then she attended activities with lots of sitting. Pt felt L glut pain for 3 days. Pt did not get to stretch. The following, pt had no L glut pain with hiking [place].  Pt feels her L LBP is better but recently is worst with her period.  Pt has been busy  and not able to do her HEP and then when the LBP comes on, she is fearful and avoids the exercises.   TODAY'S ASSESSMENT/  TREATMENT:    [facility] PT Assessment - 01/07/22 1701       Strength   Overall Strength Comments posterior sling RUE/ L glut hip ext 3/5 , L 4+/5 ( pst Tx: 4/5),  B hip abd 5/5      Palpation   Palpation comment tightness along anterolateral R leg mm, anterior rotation of L ASIS      Special Tests   Other special tests cervical endurance 2:08 sec in prone    Observation:                                                                         supination, poor stability of feet in forward and  backward walking           [facility] Adult PT Treatment/Exercise - 01/07/22 1735       Therapeutic Activites    Other Therapeutic Activities discussed compliance to HEP, reassessed goals / recert      Neuro Re-ed    Neuro Re-ed Details  cued for less supination      Manual Therapy   Manual therapy comments long aixs distraction L, ER / distraction of R leg, STM/MWM at anterolateral leg mm               PATIENT EDUCATION: Education details: provided education for stabilization and relaxation principles, dissociation and segmental movement awareness  Person educated: Patient Education method: Explanation, Demonstration, Tactile cues, Verbal cues, and Handouts Education comprehension: verbalized understanding, returned demonstration, verbal cues required, and tactile cues required   HOME EXERCISE PROGRAM: See pt instruction section       PT Long Term Goals -        PT LONG TERM GOAL #1   Title Pt will improve Lumbar FOTOfrom 47 pts to > 67 pts in order to return to PLOF and return to [employer], tennis, golf, running   ( 01/07/22: 56 pts)    Time 10    Period Weeks    Status Partially Met    Target Date 03/18/2022       PT LONG TERM GOAL #2   Title Pt will  demo levelled pelvic girdle and shoulder height    Baseline R pelvic girdle lower, R shoulder higher    Time 2    Period Weeks    Status Achieved    Target Date 08/12/21      PT LONG TERM GOAL #3   Title Pt will demo increased cervical mm endurance from 1:30 to > 2:30 before fatigue/ complaints of deltoid ache in order to continue performing work tasks with less upper trap overuse   ( 01/07/22: 2:08 sec)    Time 5    Status Partially met    Target Date 03/18/2022       PT LONG TERM GOAL #4   Title Pt will demo no perturbation with posterior sling mm test,  activation of multidifis, and increased hip ext from 3/5 > 4/5 B to demo improved deep core strength and posterior chain to minimize mm spasms with functional tasks    Time 8    Period Weeks    Status Achieved    Target Date 12/26/21      PT LONG TERM GOAL #5   Title Pt will demo IND with alignment and technique with fitness routine with co-activation of deep core in order to help pt maintain workout routine and progress with UE strengthening without relapse of SIJ / shoulder pain.    Time 10    Period Weeks    Status On-going    Target Date 03/18/2022     PT LONG TERM GOAL #6   Title Pt will increase cervical rotation from 30 deg B to > 45 deg in order to improve head turns for driving and mountain biking    Time 6    Period Weeks    Status Achieved ( 45 deg B)  01/07/22    Target Date 11/14/21      PT LONG TERM GOAL #7   Title Pt will report sleeping on her back and side 75% of the time in order to avoid sleeping on her belly to mininize asymmetries of her neck  and spinal alignment    Time 10    Period Weeks    Status Achieved    Target Date 10/07/21              Plan     Clinical Impression Statement Pt has achieved 4/7 goals and progressing well towards remaining goals.   Pt demo'd improved FOTO score for LBP, improved deep core/ multidifis, hip abduction / glut strength, less spinal and pelvic girdle  misalignment. Pt also showed increased cervical endurance strength/  Pt has gained increased propicoeption of posture and lower kinetic chain.   Functional achievements include sleeping on her back and returning to more vigorous activities like hiking and walking.  However, midback and upper neck and shoulders will still need further management and Tx.  Also, L glut pain has returned on and off with walking. Assessment today showed misalignment of L SIJ with tighter L quad and R anterolateral leg mm and decreased L glut max strength. Pt has Hx of R knee mensicus surgery in the past which may be contributing to asymmetries. Pt declined manual Tx to SIJ and will continue to apply manual only at peripheral areas  and work from regional interdependent approaches. Pt will continue to benefit from skilled PT to further increase propioception and to integrate body awareness and alignment with yoga practice and fitness routines.   Pt continues to benefit from skilled PT.     Examination-Activity Limitations Sleep;Other;Lift    Stability/Clinical Decision Making Evolving/Moderate complexity    Rehab Potential Good    PT Frequency 1x / week    PT Duration --   10   PT Treatment/Interventions Stair training;Gait training;Neuromuscular re-education;Functional mobility training;Therapeutic activities;Moist Heat;Therapeutic exercise;Cryotherapy;Manual techniques;Patient/family education;Taping;Energy conservation;Dry needling;ADLs/Self Care Home Management;Traction;Passive range of motion;Scar mobilization;Joint Manipulations;Splinting    Consulted and Agree with Plan of Care Patient                     Patient will benefit from skilled therapeutic intervention in order to improve the following deficits and impairments:  Decreased activity tolerance, Decreased balance, Decreased safety awareness, Decreased endurance, Decreased strength, Decreased coordination, Improper body mechanics, Pain, Postural  dysfunction, Decreased mobility, Hypomobility, Decreased range of motion, Impaired flexibility, Increased muscle spasms, Decreased scar mobility     Jerl Mina, PT 01/07/2022, 4:35 PM

## 2022-01-21 ENCOUNTER — Other Ambulatory Visit (HOSPITAL_COMMUNITY): Payer: Self-pay

## 2022-01-23 ENCOUNTER — Ambulatory Visit: Payer: 59 | Attending: Family Medicine | Admitting: Physical Therapy

## 2022-01-23 ENCOUNTER — Other Ambulatory Visit (HOSPITAL_COMMUNITY): Payer: Self-pay

## 2022-01-23 DIAGNOSIS — M25512 Pain in left shoulder: Secondary | ICD-10-CM | POA: Insufficient documentation

## 2022-01-23 DIAGNOSIS — G8929 Other chronic pain: Secondary | ICD-10-CM

## 2022-01-23 DIAGNOSIS — R293 Abnormal posture: Secondary | ICD-10-CM | POA: Diagnosis not present

## 2022-01-23 DIAGNOSIS — M533 Sacrococcygeal disorders, not elsewhere classified: Secondary | ICD-10-CM | POA: Diagnosis not present

## 2022-01-23 DIAGNOSIS — M25511 Pain in right shoulder: Secondary | ICD-10-CM | POA: Diagnosis not present

## 2022-01-23 DIAGNOSIS — R278 Other lack of coordination: Secondary | ICD-10-CM

## 2022-01-23 NOTE — Therapy (Addendum)
OUTPATIENT PHYSICAL THERAPY TREATMENT NOTE      Patient Name: Maria Jennings MRN: [id number] DOB:1970-12-22, 51 y.o., female Today's Date: 01/24/2022   REFERRING PROVIDER:Sonneberg, MD    PT End of Session - 01/23/22 1641     Visit Number 11    Number of Visits 20    Date for PT Re-Evaluation 03/18/22    PT Start Time 1640    PT Stop Time 1720    PT Time Calculation (min) 40 min    Activity Tolerance Patient tolerated treatment well             Past Medical History:  Diagnosis Date   Lumbago    Multiple sclerosis ([place])    Sinusitis    Vitamin D deficiency    Past Surgical History:  Procedure Laterality Date   CESAREAN SECTION     x 1   KNEE ARTHROSCOPY Right 07/2013   Patient Active Problem List   Diagnosis Date Noted   Acute pain of right shoulder 06/26/2021   Winged scapula 06/26/2021   Nausea 06/26/2021   Allergic rhinitis 11/29/2020   Lipid screening 01/10/2020   Encounter for screening for COVID-19 05/03/2019   Abnormal mammogram 01/15/2018   Vitamin D deficiency 06/11/2016   Atypical chest pain 07/06/2015   DOE (dyspnea on exertion) 07/06/2015   High risk medication use 06/05/2014   Optic neuritis 11/04/2013   Multiple sclerosis ([place]) 11/04/2013   Routine general medical examination at a health care facility 08/16/2012    REFERRING DIAG: B shoulder pain  THERAPY DIAG:  Abnormal posture  Other lack of coordination  Chronic right shoulder pain  Chronic left shoulder pain  Sacrococcygeal disorders, not elsewhere classified  Rationale for Evaluation and Treatment Rehabilitation  PERTINENT HISTORY:  See eval note 07/29/21  PRECAUTIONS: None  SUBJECTIVE:  Pt is able to bike 6 miles one time and another time 25-30 min on flat ground. Pt noticed the L hip / LBP pain but it was gone after stretches.  Pt is sleeping on her back 25% of the night. Pt still sleeps with one knee up when sidelying.   TODAY'S ASSESSMENT/  TREATMENT:   [facility] PT  Assessment - 01/24/22 1252       Observation/Other Assessments   Observations preferred sleeping position and reading in bed position: one hip flexed, anterior rotationof pelvis with pillow,      Palpation   Palpation comment pre Tx: L iliac crest higher, ( Post Tx: levelled)             [facility] Adult PT Treatment/Exercise - 01/24/22 1253       Therapeutic Activites    Other Therapeutic Activities explained strategies to minimize hip misalignment in sleeping and winddown routine      Neuro Re-ed    Neuro Re-ed Details  cued for restorative pose for winddown before bed                PATIENT EDUCATION: Education details: provided education for stabilization and relaxation principles, dissociation and segmental movement awareness  Person educated: Patient Education method: Explanation, Demonstration, Tactile cues, Verbal cues, and Handouts Education comprehension: verbalized understanding, returned demonstration, verbal cues required, and tactile cues required   HOME EXERCISE PROGRAM: See pt instruction section       PT Long Term Goals -        PT LONG TERM GOAL #1   Title Pt will improve Lumbar FOTOfrom 47 pts to > 67 pts in order to return to PLOF  and return to [employer], tennis, golf, running   ( 01/07/22: 56 pts)    Time 10    Period Weeks    Status Partially Met    Target Date 03/18/2022       PT LONG TERM GOAL #2   Title Pt will demo levelled pelvic girdle and shoulder height    Baseline R pelvic girdle lower, R shoulder higher    Time 2    Period Weeks    Status Achieved    Target Date 08/12/21      PT LONG TERM GOAL #3   Title Pt will demo increased cervical mm endurance from 1:30 to > 2:30 before fatigue/ complaints of deltoid ache in order to continue performing work tasks with less upper trap overuse   ( 01/07/22: 2:08 sec)    Time 5    Status Partially met    Target Date 03/18/2022       PT LONG TERM GOAL #4   Title Pt will demo no  perturbation with posterior sling mm test,  activation of multidifis, and increased hip ext from 3/5 > 4/5 B to demo improved deep core strength and posterior chain to minimize mm spasms with functional tasks    Time 8    Period Weeks    Status Achieved    Target Date 12/26/21      PT LONG TERM GOAL #5   Title Pt will demo IND with alignment and technique with fitness routine with co-activation of deep core in order to help pt maintain workout routine and progress with UE strengthening without relapse of SIJ / shoulder pain.    Time 10    Period Weeks    Status On-going    Target Date 03/18/2022     PT LONG TERM GOAL #6   Title Pt will increase cervical rotation from 30 deg B to > 45 deg in order to improve head turns for driving and mountain biking    Time 6    Period Weeks    Status Achieved ( 45 deg B)  01/07/22    Target Date 11/14/21      PT LONG TERM GOAL #7   Title Pt will report sleeping on her back and side 75% of the time in order to avoid sleeping on her belly to mininize asymmetries of her neck and spinal alignment    Time 10    Period Weeks    Status Achieved    Target Date 10/07/21              Plan     Clinical Impression Statement Provided options with restorative position to help pt minimize misalignment of iliac crests in sleeping and wind-down routine. Pt showed levelled pelvic girdle post Tx.  Plan to progress to yoga once pt is able to maintain levelled pelvic girdle to perform bilateral yoga poses with less pain.   Pt continues to benefit from skilled PT.     Examination-Activity Limitations Sleep;Other;Lift    Stability/Clinical Decision Making Evolving/Moderate complexity    Rehab Potential Good    PT Frequency 1x / week    PT Duration --   10   PT Treatment/Interventions Stair training;Gait training;Neuromuscular re-education;Functional mobility training;Therapeutic activities;Moist Heat;Therapeutic exercise;Cryotherapy;Manual  techniques;Patient/family education;Taping;Energy conservation;Dry needling;ADLs/Self Care Home Management;Traction;Passive range of motion;Scar mobilization;Joint Manipulations;Splinting    Consulted and Agree with Plan of Care Patient  Patient will benefit from skilled therapeutic intervention in order to improve the following deficits and impairments:  Decreased activity tolerance, Decreased balance, Decreased safety awareness, Decreased endurance, Decreased strength, Decreased coordination, Improper body mechanics, Pain, Postural dysfunction, Decreased mobility, Hypomobility, Decreased range of motion, Impaired flexibility, Increased muscle spasms, Decreased scar mobility     Jerl Mina, PT 01/24/2022, 12:55 PM

## 2022-01-27 ENCOUNTER — Other Ambulatory Visit (HOSPITAL_COMMUNITY): Payer: Self-pay

## 2022-01-30 ENCOUNTER — Other Ambulatory Visit (HOSPITAL_COMMUNITY): Payer: Self-pay

## 2022-01-30 ENCOUNTER — Ambulatory Visit: Payer: 59 | Admitting: Physical Therapy

## 2022-02-04 ENCOUNTER — Other Ambulatory Visit (HOSPITAL_COMMUNITY): Payer: Self-pay

## 2022-02-05 NOTE — Progress Notes (Signed)
51 y.o. G44P2002 Married White or Caucasian Not Hispanic or Latino female here for annual exam.   Her cycles were irregular for a while, then normalized until 2/23. No cycle from 2/23 until Aug, 2023. She bleed x 4-5 days, saturated super tampons every 3 hours. No bleeding since then. She is having tolerable vasomotor symptoms.  Sexually active, no pain or dryness.     No LMP recorded.          Sexually active: Yes.    The current method of family planning is none.    Exercising: Yes.     Hike, bike, paddle board, tennis  Smoker:  no  Health Maintenance: Pap:   7/24/20wnl Hr HPV Neg.  2017 Neg Hr HPV neg  History of abnormal Pap:  no MMG:  05/22/21 density C Bi-rads 1 neg  BMD:   n/a Colonoscopy: none  TDaP:  UTD per patient  Gardasil: n/a   reports that she has never smoked. She has never used smokeless tobacco. She reports current alcohol use of about 5.0 standard drinks of alcohol per week. She reports that she does not use drugs. 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  Daughters are almost 52 and almost 72. Her oldest daughter is a Administrator, arts at [employer] Public affairs consultant) and her younger daughter is a Equities trader at [employer].     Past Medical History:  Diagnosis Date   Lumbago    Multiple sclerosis ([place])    Sinusitis    Vitamin D deficiency     Past Surgical History:  Procedure Laterality Date   CESAREAN SECTION     x 1   KNEE ARTHROSCOPY Right 07/2013    Current Outpatient Medications  Medication Sig Dispense Refill   Cholecalciferol (VITAMIN D3) 5000 units CAPS Take 1 capsule by mouth 2 (two) times a week.     cyanocobalamin 1000 MCG tablet Take 3,000 mcg by mouth 2 (two) times a week.      Dimethyl Fumarate 240 MG CPDR Take 1 capsule by mouth twice a day. 90 capsule 3   fexofenadine-pseudoephedrine (ALLEGRA-D 24) 180-240 MG 24 hr tablet Take by mouth as needed.     No current facility-administered medications for this visit.     Family History  Problem Relation Age of Onset   Hypertension Mother    Hyperlipidemia Mother    Hypertension Father    Cancer Maternal Grandmother 82       colon   Cancer Cousin 35       colon   Breast cancer Neg Hx     Review of Systems  All other systems reviewed and are negative.   Exam:   There were no vitals taken for this visit.  Weight change: '@WEIGHTCHANGE'$ @ Height:      Ht Readings from Last 3 Encounters:  06/26/21 '5\' 9"'$  (1.753 m)  02/06/21 '5\' 9"'$  (1.753 m)  11/29/20 5' 7.76" (1.721 m)    General appearance: alert, cooperative and appears stated age Head: Normocephalic, without obvious abnormality, atraumatic Neck: no adenopathy, supple, symmetrical, trachea midline and thyroid normal to inspection and palpation Lungs: clear to auscultation bilaterally Cardiovascular: regular rate and rhythm Breasts: normal appearance, no masses or tenderness Abdomen: soft, non-tender; non distended,  no masses,  no organomegaly Extremities: extremities normal, atraumatic, no cyanosis or edema Skin: Skin color, texture, turgor normal. No rashes or lesions Lymph nodes: Cervical, supraclavicular, and axillary nodes normal. No abnormal inguinal nodes palpated Neurologic: Grossly normal  Pelvic: External genitalia:  no lesions              Urethra:  normal appearing urethra with no masses, tenderness or lesions              Bartholins and Skenes: normal                 Vagina: normal appearing vagina with normal color and discharge, no lesions              Cervix: no lesions               Bimanual Exam:  Uterus:  normal size, contour, position, consistency, mobility, non-tender              Adnexa: no mass, fullness, tenderness               Rectovaginal: Confirms               Anus:  normal sphincter tone, no lesions  Gae Dry, CMA chaperoned for the exam.  1. Well woman exam Discussed breast self exam Discussed calcium and vit D intake Mammogram in 2/24  2.  Abnormal uterine bleeding (AUB) - US PELVIS TRANSVAGINAL NON-OB (TV ONLY); Future -May need further evaluation -[facility]SH  3. Perimenopause -FSH  4. Colon cancer screening - Ambulatory referral to Gastroenterology

## 2022-02-06 ENCOUNTER — Ambulatory Visit: Payer: 59 | Admitting: Physical Therapy

## 2022-02-06 DIAGNOSIS — G8929 Other chronic pain: Secondary | ICD-10-CM

## 2022-02-06 DIAGNOSIS — M533 Sacrococcygeal disorders, not elsewhere classified: Secondary | ICD-10-CM | POA: Diagnosis not present

## 2022-02-06 DIAGNOSIS — R278 Other lack of coordination: Secondary | ICD-10-CM | POA: Diagnosis not present

## 2022-02-06 DIAGNOSIS — R293 Abnormal posture: Secondary | ICD-10-CM

## 2022-02-06 DIAGNOSIS — M25511 Pain in right shoulder: Secondary | ICD-10-CM | POA: Diagnosis not present

## 2022-02-06 DIAGNOSIS — M25512 Pain in left shoulder: Secondary | ICD-10-CM | POA: Diagnosis not present

## 2022-02-06 NOTE — Patient Instructions (Addendum)
Lying on back, knees bent    band under ballmounds  while laying on back w/ knees bent  "W" exercise  10 reps x 2 sets   Band is placed under feet, knees bent, feet are hip width apart Hold band with thumbs point out, keep upper arm and elbow touching the bed the whole time  - inhale and then exhale pull bands by bending elbows hands move in a "w"  (feel shoulder blades squeezing)   ________________   Oblique/ scapula stabilization   Opposite arm   Place band in "U"    band under ballmounds  while laying on back w/ knees bent     20 reps  on each side  Holding band from opposite thigh,  Inhale,    exhale then pull band across body while keeping elbow , shoulders, back of the head pressed down    ______________  Wall clock exercise    Wear shoe lift in R shoe this week  Multidifis

## 2022-02-06 NOTE — Therapy (Signed)
OUTPATIENT PHYSICAL THERAPY TREATMENT NOTE      Patient Name: Maria Jennings MRN: [id number] DOB:[birth year], 51 y.o., female Today's Date: 02/07/2022   REFERRING PROVIDER:Sonneberg, MD    PT End of Session - 02/06/22 1645     Visit Number 12    Number of Visits 20    Date for PT Re-Evaluation 03/18/22    PT Start Time 1645    PT Stop Time 1735    PT Time Calculation (min) 50 min    Activity Tolerance Patient tolerated treatment well             Past Medical History:  Diagnosis Date   Lumbago    Multiple sclerosis ([place])    Sinusitis    Vitamin D deficiency    Past Surgical History:  Procedure Laterality Date   CESAREAN SECTION     x 1   KNEE ARTHROSCOPY Right 07/2013   Patient Active Problem List   Diagnosis Date Noted   Acute pain of right shoulder 06/26/2021   Winged scapula 06/26/2021   Nausea 06/26/2021   Allergic rhinitis 11/29/2020   Lipid screening 01/10/2020   Encounter for screening for COVID-19 05/03/2019   Abnormal mammogram 01/15/2018   Vitamin D deficiency 06/11/2016   Atypical chest pain 07/06/2015   DOE (dyspnea on exertion) 07/06/2015   High risk medication use 06/05/2014   Optic neuritis 11/04/2013   Multiple sclerosis ([place]) 11/04/2013   Routine general medical examination at a health care facility 08/16/2012    REFERRING DIAG: B shoulder pain  THERAPY DIAG:  Abnormal posture  Other lack of coordination  Chronic right shoulder pain  Chronic left shoulder pain  Sacrococcygeal disorders, not elsewhere classified  Rationale for Evaluation and Treatment Rehabilitation  PERTINENT HISTORY:  See eval note 07/29/21  PRECAUTIONS: None  SUBJECTIVE:  Pt had no pain in her back after gardening for 5 hours straight  Pt noticed she is not raising her hip up when sleeping as much.   TODAY'S ASSESSMENT/  TREATMENT:   [facility] PT Assessment - 02/06/22 1652       Palpation   Spinal mobility tightness  at B occiput mm, interspinal/ cervical  mm attachments, hypomobile at C1-2 deviated to L , T1-2 deviated to R    Palpation comment R shoulder / L  iliac crest higher              [facility] Adult PT Treatment/Exercise - 02/07/22 1042       Therapeutic Activites    Other Therapeutic Activities applied shoe lift in toe box and heel space      Neuro Re-ed    Neuro Re-ed Details  cued for new cervical HEP and resistance band exericses for cervica;/ scapular strengthening with deep core coordination      Modalities   Modalities Moist Heat      Moist Heat Therapy   Number Minutes Moist Heat 5 Minutes    Moist Heat Location --   cervical/ T     Manual Therapy   Manual therapy comments long axis distraction at occiput, STM/MWM at problem areas noted in assessment to promote less deviations at cervical and upper T segments and to lower  R shoulder           [facility] Adult PT Treatment/Exercise - 02/07/22 1042       Therapeutic Activites    Other Therapeutic Activities applied shoe lift in toe box and heel space      Neuro Re-ed    Neuro  Re-ed Details  cued for new cervical HEP and resistance band exericses for cervica;/ scapular strengthening with deep core coordination      Modalities   Modalities Moist Heat      Moist Heat Therapy   Number Minutes Moist Heat 5 Minutes    Moist Heat Location --   cerical/ T     Manual Therapy   Manual therapy comments long axis distraction at occiput, STM/MWM at problem areas noted in assessment to promote less deviations at cervical and upper T segments and to lower  R shoulder                  PATIENT EDUCATION: Education details: provided education for stabilization and relaxation principles, dissociation and segmental movement awareness  Person educated: Patient Education method: Explanation, Demonstration, Tactile cues, Verbal cues, and Handouts Education comprehension: verbalized understanding, returned demonstration, verbal cues required, and tactile cues  required   HOME EXERCISE PROGRAM: See pt instruction section       PT Long Term Goals -        PT LONG TERM GOAL #1   Title Pt will improve Lumbar FOTOfrom 47 pts to > 67 pts in order to return to PLOF and return to [employer], tennis, golf, running   ( 01/07/22: 56 pts)    Time 10    Period Weeks    Status Partially Met    Target Date 03/18/2022       PT LONG TERM GOAL #2   Title Pt will demo levelled pelvic girdle and shoulder height    Baseline R pelvic girdle lower, R shoulder higher    Time 2    Period Weeks    Status Achieved    Target Date 08/12/21      PT LONG TERM GOAL #3   Title Pt will demo increased cervical mm endurance from 1:30 to > 2:30 before fatigue/ complaints of deltoid ache in order to continue performing work tasks with less upper trap overuse   ( 01/07/22: 2:08 sec)    Time 5    Status Partially met    Target Date 03/18/2022       PT LONG TERM GOAL #4   Title Pt will demo no perturbation with posterior sling mm test,  activation of multidifis, and increased hip ext from 3/5 > 4/5 B to demo improved deep core strength and posterior chain to minimize mm spasms with functional tasks    Time 8    Period Weeks    Status Achieved    Target Date 12/26/21      PT LONG TERM GOAL #5   Title Pt will demo IND with alignment and technique with fitness routine with co-activation of deep core in order to help pt maintain workout routine and progress with UE strengthening without relapse of SIJ / shoulder pain.    Time 10    Period Weeks    Status On-going    Target Date 03/18/2022     PT LONG TERM GOAL #6   Title Pt will increase cervical rotation from 30 deg B to > 45 deg in order to improve head turns for driving and mountain biking    Time 6    Period Weeks    Status Achieved ( 45 deg B)  01/07/22    Target Date 11/14/21      PT LONG TERM GOAL #7   Title Pt will report sleeping on her back and side 75% of the time  in order to avoid sleeping on her  belly to mininize asymmetries of her neck and spinal alignment    Time 10    Period Weeks    Status Achieved    Target Date 10/07/21                  Clinical Impression Statement Pt continues to show higher L iliac crest than R with R shoulder also higher. Provided shoe lift in toe box and heel space after which pelvic girlde remained aligned and did not cause pain. Experimenting with shoe lift this week.  Manual Tx was applied to lower R shoulder which is related to deviated cervical and upper thoracic segments.  Plan to continue to realigning these segments with more manual Tx next session.   Initiated cervical/ scapular strengthening with deep core coordination which will help with upper spine deviations and strength.   Pt continues to benefit from skilled PT.     Examination-Activity Limitations Sleep;Other;Lift    Stability/Clinical Decision Making Evolving/Moderate complexity    Rehab Potential Good    PT Frequency 1x / week    PT Duration --   10   PT Treatment/Interventions Stair training;Gait training;Neuromuscular re-education;Functional mobility training;Therapeutic activities;Moist Heat;Therapeutic exercise;Cryotherapy;Manual techniques;Patient/family education;Taping;Energy conservation;Dry needling;ADLs/Self Care Home Management;Traction;Passive range of motion;Scar mobilization;Joint Manipulations;Splinting    Consulted and Agree with Plan of Care Patient                     Patient will benefit from skilled therapeutic intervention in order to improve the following deficits and impairments:  Decreased activity tolerance, Decreased balance, Decreased safety awareness, Decreased endurance, Decreased strength, Decreased coordination, Improper body mechanics, Pain, Postural dysfunction, Decreased mobility, Hypomobility, Decreased range of motion, Impaired flexibility, Increased muscle spasms, Decreased scar mobility     Jerl Mina, PT 02/07/2022, 10:50  AM

## 2022-02-11 ENCOUNTER — Other Ambulatory Visit (HOSPITAL_COMMUNITY): Payer: Self-pay

## 2022-02-12 ENCOUNTER — Encounter: Payer: Self-pay | Admitting: Obstetrics and Gynecology

## 2022-02-12 ENCOUNTER — Ambulatory Visit (INDEPENDENT_AMBULATORY_CARE_PROVIDER_SITE_OTHER): Payer: 59 | Admitting: Obstetrics and Gynecology

## 2022-02-12 VITALS — BP 110/72 | HR 79 | Ht 67.5 in | Wt 188.0 lb

## 2022-02-12 DIAGNOSIS — N939 Abnormal uterine and vaginal bleeding, unspecified: Secondary | ICD-10-CM

## 2022-02-12 DIAGNOSIS — Z1211 Encounter for screening for malignant neoplasm of colon: Secondary | ICD-10-CM

## 2022-02-12 DIAGNOSIS — Z01419 Encounter for gynecological examination (general) (routine) without abnormal findings: Secondary | ICD-10-CM

## 2022-02-12 DIAGNOSIS — N951 Menopausal and female climacteric states: Secondary | ICD-10-CM | POA: Diagnosis not present

## 2022-02-12 NOTE — Patient Instructions (Signed)

## 2022-02-13 ENCOUNTER — Ambulatory Visit: Payer: 59 | Attending: Family Medicine | Admitting: Physical Therapy

## 2022-02-13 DIAGNOSIS — M533 Sacrococcygeal disorders, not elsewhere classified: Secondary | ICD-10-CM | POA: Diagnosis not present

## 2022-02-13 DIAGNOSIS — R278 Other lack of coordination: Secondary | ICD-10-CM | POA: Insufficient documentation

## 2022-02-13 DIAGNOSIS — M25511 Pain in right shoulder: Secondary | ICD-10-CM | POA: Insufficient documentation

## 2022-02-13 DIAGNOSIS — R293 Abnormal posture: Secondary | ICD-10-CM | POA: Diagnosis not present

## 2022-02-13 DIAGNOSIS — M25512 Pain in left shoulder: Secondary | ICD-10-CM | POA: Diagnosis not present

## 2022-02-13 DIAGNOSIS — G8929 Other chronic pain: Secondary | ICD-10-CM | POA: Insufficient documentation

## 2022-02-13 LAB — FOLLICLE STIMULATING HORMONE: FSH: 40.7 m[IU]/mL

## 2022-02-13 NOTE — Therapy (Signed)
OUTPATIENT PHYSICAL THERAPY TREATMENT NOTE      Patient Name: Maria Jennings MRN: [id number] DOB:1970-04-27, 51 y.o., female Today's Date: 02/13/2022   REFERRING PROVIDER:Sonneberg, MD    PT End of Session - 02/13/22 1643     Visit Number 13    Number of Visits 20    Date for PT Re-Evaluation 03/18/22    PT Start Time 5361    PT Stop Time 4431    PT Time Calculation (min) 45 min    Activity Tolerance Patient tolerated treatment well             Past Medical History:  Diagnosis Date   Lumbago    Multiple sclerosis ([place])    Sinusitis    Vitamin D deficiency    Past Surgical History:  Procedure Laterality Date   CESAREAN SECTION     x 1   KNEE ARTHROSCOPY Right 07/2013   Patient Active Problem List   Diagnosis Date Noted   Acute pain of right shoulder 06/26/2021   Winged scapula 06/26/2021   Nausea 06/26/2021   Allergic rhinitis 11/29/2020   Lipid screening 01/10/2020   Encounter for screening for COVID-19 05/03/2019   Abnormal mammogram 01/15/2018   Vitamin D deficiency 06/11/2016   Atypical chest pain 07/06/2015   DOE (dyspnea on exertion) 07/06/2015   High risk medication use 06/05/2014   Optic neuritis 11/04/2013   Multiple sclerosis ([place]) 11/04/2013   Routine general medical examination at a health care facility 08/16/2012    REFERRING DIAG: B shoulder pain  THERAPY DIAG:  Abnormal posture  Other lack of coordination  Chronic right shoulder pain  Chronic left shoulder pain  Sacrococcygeal disorders, not elsewhere classified  Rationale for Evaluation and Treatment Rehabilitation  PERTINENT HISTORY:  See eval note 07/29/21  PRECAUTIONS: None  SUBJECTIVE:   Pt reported:  "I did end up having a massage on Tuesday evening.  I'm curious to see about my pelvic height. I discontinued the shoe lift because it bothered me the night of The blue band for my thoracic was too hard,  causing spasm. Changed it to red Done a couple of days.  Worked  the yard five hours Saturday. Left hip I felt more than Back. Did took Aleve for 24 hrs preventative.  Had 10 hr days and Anika sport Mon and yesterday. "   TODAY'S ASSESSMENT/  TREATMENT:    [facility] PT Assessment - 02/13/22 1647       Palpation   SI assessment  limited FADDIR with pain B ( R > L )    Palpation comment L iliac crest higher, medial hamstring tightness B              [facility] Adult PT Treatment/Exercise - 02/13/22 1721       Therapeutic Activites    Other Therapeutic Activities explained what manual Tx will be like at SIJ and showed anatomy images for mm in strengthening and arthrokinematic, pain science education to mitigate fear of movement s      Neuro Re-ed    Neuro Re-ed Details  cued for hip/ SIJ mobility HEP      Manual Therapy   Manual therapy comments STM/MWM at problem areas noted in assessment to promote ER of ilia and increase FADDIR mobility B                PATIENT EDUCATION: Education details: provided education for stabilization and relaxation principles, dissociation and segmental movement awareness  Person educated: Patient Education method: Explanation,  Demonstration, Tactile cues, Verbal cues, and Handouts Education comprehension: verbalized understanding, returned demonstration, verbal cues required, and tactile cues required   HOME EXERCISE PROGRAM: See pt instruction section       PT Long Term Goals -        PT LONG TERM GOAL #1   Title Pt will improve Lumbar FOTOfrom 47 pts to > 67 pts in order to return to PLOF and return to [employer], tennis, golf, running   ( 01/07/22: 56 pts)    Time 10    Period Weeks    Status Partially Met    Target Date 03/18/2022       PT LONG TERM GOAL #2   Title Pt will demo levelled pelvic girdle and shoulder height    Baseline R pelvic girdle lower, R shoulder higher    Time 2    Period Weeks    Status Achieved    Target Date 08/12/21      PT LONG TERM GOAL #3   Title Pt will  demo increased cervical mm endurance from 1:30 to > 2:30 before fatigue/ complaints of deltoid ache in order to continue performing work tasks with less upper trap overuse   ( 01/07/22: 2:08 sec)    Time 5    Status Partially met    Target Date 03/18/2022       PT LONG TERM GOAL #4   Title Pt will demo no perturbation with posterior sling mm test,  activation of multidifis, and increased hip ext from 3/5 > 4/5 B to demo improved deep core strength and posterior chain to minimize mm spasms with functional tasks    Time 8    Period Weeks    Status Achieved    Target Date 12/26/21      PT LONG TERM GOAL #5   Title Pt will demo IND with alignment and technique with fitness routine with co-activation of deep core in order to help pt maintain workout routine and progress with UE strengthening without relapse of SIJ / shoulder pain.    Time 10    Period Weeks    Status On-going    Target Date 03/18/2022     PT LONG TERM GOAL #6   Title Pt will increase cervical rotation from 30 deg B to > 45 deg in order to improve head turns for driving and mountain biking    Time 6    Period Weeks    Status Achieved ( 45 deg B)  01/07/22    Target Date 11/14/21      PT LONG TERM GOAL #7   Title Pt will report sleeping on her back and side 75% of the time in order to avoid sleeping on her belly to mininize asymmetries of her neck and spinal alignment    Time 10    Period Weeks    Status Achieved    Target Date 10/07/21                  Clinical Impression Statement Pt continues to show higher L iliac crest than R with R shoulder also higher.  Pt discontinued shoe lift due to discomfort.  Last session helped with the C/T region.   Today focused on increasing  SIJ mobility to address higher L iliac crest.  Plan to assess lower kinetic chain as B hamstrings/ anterolateral leg showed tightness.   Pt continues to benefit from skilled PT.     Examination-Activity Limitations Sleep;Other;Lift  Stability/Clinical Decision Making Evolving/Moderate complexity    Rehab Potential Good    PT Frequency 1x / week    PT Duration --   10   PT Treatment/Interventions Stair training;Gait training;Neuromuscular re-education;Functional mobility training;Therapeutic activities;Moist Heat;Therapeutic exercise;Cryotherapy;Manual techniques;Patient/family education;Taping;Energy conservation;Dry needling;ADLs/Self Care Home Management;Traction;Passive range of motion;Scar mobilization;Joint Manipulations;Splinting    Consulted and Agree with Plan of Care Patient                     Patient will benefit from skilled therapeutic intervention in order to improve the following deficits and impairments:  Decreased activity tolerance, Decreased balance, Decreased safety awareness, Decreased endurance, Decreased strength, Decreased coordination, Improper body mechanics, Pain, Postural dysfunction, Decreased mobility, Hypomobility, Decreased range of motion, Impaired flexibility, Increased muscle spasms, Decreased scar mobility     Jerl Mina, PT 02/13/2022, 4:44 PM

## 2022-02-20 ENCOUNTER — Ambulatory Visit: Payer: 59 | Admitting: Physical Therapy

## 2022-02-25 ENCOUNTER — Other Ambulatory Visit (HOSPITAL_COMMUNITY): Payer: Self-pay

## 2022-02-27 ENCOUNTER — Ambulatory Visit: Payer: 59 | Admitting: Physical Therapy

## 2022-02-27 DIAGNOSIS — M533 Sacrococcygeal disorders, not elsewhere classified: Secondary | ICD-10-CM | POA: Diagnosis not present

## 2022-02-27 DIAGNOSIS — G8929 Other chronic pain: Secondary | ICD-10-CM | POA: Diagnosis not present

## 2022-02-27 DIAGNOSIS — R293 Abnormal posture: Secondary | ICD-10-CM

## 2022-02-27 DIAGNOSIS — M25511 Pain in right shoulder: Secondary | ICD-10-CM | POA: Diagnosis not present

## 2022-02-27 DIAGNOSIS — R278 Other lack of coordination: Secondary | ICD-10-CM | POA: Diagnosis not present

## 2022-02-27 DIAGNOSIS — M25512 Pain in left shoulder: Secondary | ICD-10-CM | POA: Diagnosis not present

## 2022-02-27 NOTE — Therapy (Signed)
OUTPATIENT PHYSICAL THERAPY TREATMENT NOTE      Patient Name: Maria Jennings MRN: [id number] DOB:10-13-1970, 51 y.o., female Today's Date: 02/27/2022   REFERRING PROVIDER:Sonneberg, MD    PT End of Session - 02/27/22 1638     Visit Number 14    Number of Visits 20    Date for PT Re-Evaluation 03/18/22    PT Start Time 1636    PT Stop Time 9381    PT Time Calculation (min) 39 min    Activity Tolerance Patient tolerated treatment well             Past Medical History:  Diagnosis Date   Lumbago    Multiple sclerosis ([place])    Sinusitis    Vitamin D deficiency    Past Surgical History:  Procedure Laterality Date   CESAREAN SECTION     x 1   KNEE ARTHROSCOPY Right 07/2013   Patient Active Problem List   Diagnosis Date Noted   Acute pain of right shoulder 06/26/2021   Winged scapula 06/26/2021   Nausea 06/26/2021   Allergic rhinitis 11/29/2020   Lipid screening 01/10/2020   Encounter for screening for COVID-19 05/03/2019   Abnormal mammogram 01/15/2018   Vitamin D deficiency 06/11/2016   Atypical chest pain 07/06/2015   DOE (dyspnea on exertion) 07/06/2015   High risk medication use 06/05/2014   Optic neuritis 11/04/2013   Multiple sclerosis ([place]) 11/04/2013   Routine general medical examination at a health care facility 08/16/2012    REFERRING DIAG: B shoulder pain  THERAPY DIAG:  Abnormal posture  Other lack of coordination  Chronic right shoulder pain  Chronic left shoulder pain  Sacrococcygeal disorders, not elsewhere classified  Rationale for Evaluation and Treatment Rehabilitation  PERTINENT HISTORY:  See eval note 07/29/21  PRECAUTIONS: None  SUBJECTIVE:   Pt reported:  L noticed pain at the glut after last session. Pt was not able to walk. Pt also noticed she moves objects with L foot. Pt notices massage therapy helps the tightness at L glut. Pt is frustrated. Pt has stopped multidifis and deep core HEP. Pt hiked [place]. Pt tracked  her relapse with L glut pain to occur from gardening 3 weeks ago.    TODAY'S ASSESSMENT/  TREATMENT:     [facility] PT Assessment - 02/28/22 1005       Palpation   Palpation comment L iliac crest higher             [facility] Adult PT Treatment/Exercise - 02/28/22 1005       Therapeutic Activites    Other Therapeutic Activities active listening to pt's relapse of pain at L glut and provided perspective on options moving forward to troubleshoot      Neuro Re-ed    Neuro Re-ed Details  cued for lower kinetic chain with strap stretches              PATIENT EDUCATION: Education details: provided education for stabilization and relaxation principles, dissociation and segmental movement awareness  Person educated: Patient Education method: Explanation, Demonstration, Tactile cues, Verbal cues, and Handouts Education comprehension: verbalized understanding, returned demonstration, verbal cues required, and tactile cues required   HOME EXERCISE PROGRAM: See pt instruction section       PT Long Term Goals -        PT LONG TERM GOAL #1   Title Pt will improve Lumbar FOTOfrom 47 pts to > 67 pts in order to return to PLOF and return to [employer], tennis,  golf, running   ( 01/07/22: 56 pts)    Time 10    Period Weeks    Status Partially Met    Target Date 03/18/2022       PT LONG TERM GOAL #2   Title Pt will demo levelled pelvic girdle and shoulder height    Baseline R pelvic girdle lower, R shoulder higher    Time 2    Period Weeks    Status Achieved    Target Date 08/12/21      PT LONG TERM GOAL #3   Title Pt will demo increased cervical mm endurance from 1:30 to > 2:30 before fatigue/ complaints of deltoid ache in order to continue performing work tasks with less upper trap overuse   ( 01/07/22: 2:08 sec)    Time 5    Status Partially met    Target Date 03/18/2022       PT LONG TERM GOAL #4   Title Pt will demo no perturbation with posterior sling mm test,   activation of multidifis, and increased hip ext from 3/5 > 4/5 B to demo improved deep core strength and posterior chain to minimize mm spasms with functional tasks    Time 8    Period Weeks    Status Achieved    Target Date 12/26/21      PT LONG TERM GOAL #5   Title Pt will demo IND with alignment and technique with fitness routine with co-activation of deep core in order to help pt maintain workout routine and progress with UE strengthening without relapse of SIJ / shoulder pain.    Time 10    Period Weeks    Status On-going    Target Date 03/18/2022     PT LONG TERM GOAL #6   Title Pt will increase cervical rotation from 30 deg B to > 45 deg in order to improve head turns for driving and mountain biking    Time 6    Period Weeks    Status Achieved ( 45 deg B)  01/07/22    Target Date 11/14/21      PT LONG TERM GOAL #7   Title Pt will report sleeping on her back and side 75% of the time in order to avoid sleeping on her belly to mininize asymmetries of her neck and spinal alignment    Time 10    Period Weeks    Status Achieved    Target Date 10/07/21                  Clinical Impression Statement Pt continues to show higher L iliac crest.  Provided CKC stretches with cues for technique and explanations to mm groups ( with strap to address lower kinetic chain to address L glut tightness/pain).   Provided active listening to pt's awareness of using L leg to move objects with her foot. Advised pt to be aware and to stop this habit to see if it will help minimize L glut pain. Advised pt to perform LKC stretches for her legs and to return to deep core and multidifis strengthening. Pt voiced understanding.   Avoiding manual Tx at SIJ and low back per pt request and pt's negative response to last Tx at SIJ.    Pt continues to benefit from skilled PT.       Examination-Activity Limitations Sleep;Other;Lift    Stability/Clinical Decision Making Evolving/Moderate complexity     Rehab Potential Good    PT Frequency 1x / week  PT Duration --   10   PT Treatment/Interventions Stair training;Gait training;Neuromuscular re-education;Functional mobility training;Therapeutic activities;Moist Heat;Therapeutic exercise;Cryotherapy;Manual techniques;Patient/family education;Taping;Energy conservation;Dry needling;ADLs/Self Care Home Management;Traction;Passive range of motion;Scar mobilization;Joint Manipulations;Splinting    Consulted and Agree with Plan of Care Patient                     Patient will benefit from skilled therapeutic intervention in order to improve the following deficits and impairments:  Decreased activity tolerance, Decreased balance, Decreased safety awareness, Decreased endurance, Decreased strength, Decreased coordination, Improper body mechanics, Pain, Postural dysfunction, Decreased mobility, Hypomobility, Decreased range of motion, Impaired flexibility, Increased muscle spasms, Decreased scar mobility     Jerl Mina, PT 02/27/2022, 4:38 PM

## 2022-02-27 NOTE — Patient Instructions (Signed)
Stretches : (Cuing provided for proper alignment)  Instructions start with Strap on R    Stretches for your legs: LAYING on Back Use upper arms and elbows for stability when pulling strap Opposite knee bent and foot firm in align with hip   Strap on ballmound:  Hip socket  _strap, L knee bent, R ballmound against strap and spread toes, rolling foot 15 deg out and in across midline.  10 reps each side   Hamstring _knee bends  10 reps  With knee pointing straight ( slightly to outside to minimize snapping sensation)      10 reps with knee pointing out towards armpit ( notice the stretch in the medial hamstring muscle)     IT band  _scoot hips to R, cross R leg over L and straighten knee with strap on [place],   Bend knee back and forth 5x    Quad in sidelying _strap around the ankle, pulling ankle towards buttocks  Bottom leg firm and stabilization with knee bent  Adductors and pelvic floor ( [place]) : Delane Ginger are wide towards armpits, sole of feet towards ceiling   On your back again:  Strap under R thigh Hip abductors ( figure 4)      ,L ankle over R thigh ( stretching L glut)     5  Breaths

## 2022-02-28 ENCOUNTER — Other Ambulatory Visit (HOSPITAL_COMMUNITY): Payer: Self-pay

## 2022-03-05 ENCOUNTER — Encounter: Payer: Self-pay | Admitting: Family Medicine

## 2022-03-05 ENCOUNTER — Other Ambulatory Visit (INDEPENDENT_AMBULATORY_CARE_PROVIDER_SITE_OTHER): Payer: 59

## 2022-03-05 ENCOUNTER — Other Ambulatory Visit: Payer: Self-pay | Admitting: Pharmacist

## 2022-03-05 ENCOUNTER — Ambulatory Visit (INDEPENDENT_AMBULATORY_CARE_PROVIDER_SITE_OTHER): Payer: 59

## 2022-03-05 ENCOUNTER — Other Ambulatory Visit (HOSPITAL_COMMUNITY): Payer: Self-pay

## 2022-03-05 ENCOUNTER — Telehealth: Payer: Self-pay | Admitting: Obstetrics and Gynecology

## 2022-03-05 DIAGNOSIS — N939 Abnormal uterine and vaginal bleeding, unspecified: Secondary | ICD-10-CM

## 2022-03-05 DIAGNOSIS — G35 Multiple sclerosis: Secondary | ICD-10-CM | POA: Diagnosis not present

## 2022-03-05 DIAGNOSIS — Z79899 Other long term (current) drug therapy: Secondary | ICD-10-CM

## 2022-03-05 LAB — COMPREHENSIVE METABOLIC PANEL
ALT: 11 U/L (ref 0–35)
AST: 9 U/L (ref 0–37)
Albumin: 4.7 g/dL (ref 3.5–5.2)
Alkaline Phosphatase: 68 U/L (ref 39–117)
BUN: 16 mg/dL (ref 6–23)
CO2: 30 mEq/L (ref 19–32)
Calcium: 9.6 mg/dL (ref 8.4–10.5)
Chloride: 101 mEq/L (ref 96–112)
Creatinine, Ser: 0.58 mg/dL (ref 0.40–1.20)
GFR: 103.04 mL/min (ref >60.00)
Glucose, Bld: 73 mg/dL (ref 70–99)
Potassium: 4.3 mEq/L (ref 3.5–5.1)
Sodium: 137 mEq/L (ref 135–145)
Total Bilirubin: 0.5 mg/dL (ref 0.2–1.2)
Total Protein: 6.9 g/dL (ref 6.0–8.3)

## 2022-03-05 LAB — CBC WITH DIFFERENTIAL/PLATELET
Basophils Absolute: 0.1 10*3/uL (ref 0.0–0.1)
Basophils Relative: 1.9 % (ref 0.0–3.0)
Eosinophils Absolute: 0.2 10*3/uL (ref 0.0–0.7)
Eosinophils Relative: 5.4 % — ABNORMAL HIGH (ref 0.0–5.0)
HCT: 43.3 % (ref 36.0–46.0)
Hemoglobin: 15 g/dL (ref 12.0–15.0)
Lymphocytes Relative: 32.7 % (ref 12.0–46.0)
Lymphs Abs: 1.2 10*3/uL (ref 0.7–4.0)
MCHC: 34.6 g/dL (ref 30.0–36.0)
MCV: 94.9 fl (ref 78.0–100.0)
Monocytes Absolute: 0.4 10*3/uL (ref 0.1–1.0)
Monocytes Relative: 11.6 % (ref 3.0–12.0)
Neutro Abs: 1.8 10*3/uL (ref 1.4–7.7)
Neutrophils Relative %: 48.4 % (ref 43.0–77.0)
Platelets: 231 10*3/uL (ref 150.0–400.0)
RBC: 4.56 Mil/uL (ref 3.87–5.11)
RDW: 12.4 % (ref 11.5–15.5)
WBC: 3.7 10*3/uL — ABNORMAL LOW (ref 4.0–10.5)

## 2022-03-05 LAB — VITAMIN B12: Vitamin B-12: 684 pg/mL (ref 211–911)

## 2022-03-05 LAB — VITAMIN D 25 HYDROXY (VIT D DEFICIENCY, FRACTURES): VITD: 50.29 ng/mL (ref 30.00–100.00)

## 2022-03-05 MED ORDER — DIMETHYL FUMARATE 240 MG PO CPDR
DELAYED_RELEASE_CAPSULE | ORAL | 3 refills | Status: DC
  Filled 2022-03-05: qty 90, fill #0

## 2022-03-05 MED ORDER — DIMETHYL FUMARATE 240 MG PO CPDR
DELAYED_RELEASE_CAPSULE | ORAL | 3 refills | Status: DC
  Filled 2022-03-05: qty 60, 30d supply, fill #0
  Filled 2022-04-03: qty 60, 30d supply, fill #1
  Filled 2022-05-02: qty 60, 30d supply, fill #2
  Filled 2022-06-06: qty 60, 30d supply, fill #3
  Filled 2022-07-01: qty 60, 30d supply, fill #4
  Filled 2022-08-01: qty 60, 30d supply, fill #5

## 2022-03-05 NOTE — Telephone Encounter (Signed)
Please let the patient know that her ultrasound was essentially normal. She had one small fibroid (incidental finding), not worrisome.  Given her recent [facility] that was just in the menopausal range and her bleeding history, she is still perimenopausal. I would recommend that she take a small dose of progesterone for 5 days every other month until she goes 6 months without bleeding. This will just prevent her from needing further ultrasounds or biopsies if she has recurrent irregular bleeding.  If she is agreeable, please call in provera 5 mg x 5 days every other month until she goes 6 months without any bleeding, #15, 1 refill

## 2022-03-10 NOTE — Telephone Encounter (Signed)
Patient asked me to send in my also and she will think about the provera option.

## 2022-03-12 ENCOUNTER — Other Ambulatory Visit (HOSPITAL_COMMUNITY): Payer: Self-pay

## 2022-03-13 ENCOUNTER — Ambulatory Visit: Payer: 59 | Admitting: Physical Therapy

## 2022-03-17 DIAGNOSIS — G35 Multiple sclerosis: Secondary | ICD-10-CM | POA: Diagnosis not present

## 2022-03-17 DIAGNOSIS — E559 Vitamin D deficiency, unspecified: Secondary | ICD-10-CM | POA: Diagnosis not present

## 2022-03-17 DIAGNOSIS — E538 Deficiency of other specified B group vitamins: Secondary | ICD-10-CM | POA: Diagnosis not present

## 2022-03-21 ENCOUNTER — Ambulatory Visit: Payer: 59 | Admitting: Physical Therapy

## 2022-03-27 ENCOUNTER — Ambulatory Visit: Payer: 59 | Admitting: Physical Therapy

## 2022-03-31 ENCOUNTER — Other Ambulatory Visit (HOSPITAL_COMMUNITY): Payer: Self-pay

## 2022-04-03 ENCOUNTER — Other Ambulatory Visit (HOSPITAL_COMMUNITY): Payer: Self-pay

## 2022-04-03 ENCOUNTER — Other Ambulatory Visit: Payer: Self-pay

## 2022-04-08 ENCOUNTER — Other Ambulatory Visit (HOSPITAL_COMMUNITY): Payer: Self-pay

## 2022-04-29 ENCOUNTER — Other Ambulatory Visit (HOSPITAL_COMMUNITY): Payer: Self-pay

## 2022-05-01 ENCOUNTER — Other Ambulatory Visit (HOSPITAL_COMMUNITY): Payer: Self-pay

## 2022-05-02 ENCOUNTER — Other Ambulatory Visit (HOSPITAL_COMMUNITY): Payer: Self-pay

## 2022-05-02 ENCOUNTER — Telehealth: Payer: Self-pay | Admitting: Pharmacist

## 2022-05-02 NOTE — Telephone Encounter (Signed)
Called patient to schedule an appointment for the [facility] [facility]. I was unable to reach the patient so I left a HIPAA-compliant message requesting that the patient return my call.   Luke Van Ausdall, PharmD, BCACP, CPP Clinical Pharmacist [facility] [phone]  

## 2022-05-05 ENCOUNTER — Other Ambulatory Visit (HOSPITAL_COMMUNITY): Payer: Self-pay

## 2022-05-06 ENCOUNTER — Other Ambulatory Visit (HOSPITAL_COMMUNITY): Payer: Self-pay

## 2022-05-06 ENCOUNTER — Other Ambulatory Visit: Payer: Self-pay

## 2022-05-07 ENCOUNTER — Other Ambulatory Visit (HOSPITAL_COMMUNITY): Payer: Self-pay

## 2022-05-12 ENCOUNTER — Ambulatory Visit: Payer: Commercial Managed Care - PPO | Attending: Family Medicine | Admitting: Pharmacist

## 2022-05-12 DIAGNOSIS — Z79899 Other long term (current) drug therapy: Secondary | ICD-10-CM

## 2022-05-12 NOTE — Progress Notes (Signed)
S: Patient presents today for review of their specialty medication.    Patient is currently taking Tecfidera (dimethyl fumarate) for MS. Patient is managed by Dr. Manuella Ghazi for this.    Adherence: confirms   Efficacy: pt is happy with his results   Dosing: 240 mg BID   Renal adjustment: no adjustment necessary   Hepatic adjustment: no adjustment necessary   Dose adjustment for toxicity:  Flushing, GI intolerance, or intolerance to maintenance dose: Consider temporary dose reduction to 120 mg twice daily (resume recommended maintenance dose of 240 mg twice daily within 4 weeks). Consider discontinuation in patients who cannot tolerate return to the maintenance dose.   Hepatic injury (suspected drug-induced), clinically significant: Discontinue treatment.   Lymphocyte count <500/mm3 persisting for >6 months: Consider treatment interruption.   Serious infection: Consider withholding treatment until infection resolves.   Current adverse effects: Flushing, skin rash, or puritus: none S/sx of infection: none  GI upset: none  S/sx of heptotoxicity: none  O: Labs followed by patient's PCP. WNL per pt.      Latest Ref Rng & Units 03/05/2022   11:17 AM 06/21/2021   10:29 AM 02/06/2021    2:37 PM  BMP  Glucose 70 - 99 mg/dL 73  86  76   BUN 6 - 23 mg/dL '16  14  17   '$ Creatinine 0.40 - 1.20 mg/dL 0.58  0.64  0.56   BUN/Creat Ratio 6 - 22 (calc)   NOT APPLICABLE   Sodium 253 - 145 mEq/L 137  137  139   Potassium 3.5 - 5.1 mEq/L 4.3  4.3  3.9   Chloride 96 - 112 mEq/L 101  101  103   CO2 19 - 32 mEq/L '30  27  22   '$ Calcium 8.4 - 10.5 mg/dL 9.6  9.8  9.3    CBC    Component Value Date/Time   WBC 3.7 (L) 03/05/2022 1117   RBC 4.56 03/05/2022 1117   HGB 15.0 03/05/2022 1117   HGB 14.6 02/07/2020 0915   HCT 43.3 03/05/2022 1117   HCT 43.3 02/07/2020 0915   PLT 231.0 03/05/2022 1117   PLT 221 02/07/2020 0915   MCV 94.9 03/05/2022 1117   MCV 91 02/07/2020 0915   MCV 90 12/15/2013  0752   MCH 32.1 02/06/2021 1437   MCHC 34.6 03/05/2022 1117   RDW 12.4 03/05/2022 1117   RDW 14.0 02/07/2020 0915   RDW 15.8 (H) 12/15/2013 0752   LYMPHSABS 1.2 03/05/2022 1117   LYMPHSABS 1.3 02/07/2020 0915   LYMPHSABS 1.2 12/15/2013 0752   MONOABS 0.4 03/05/2022 1117   MONOABS 0.5 12/15/2013 0752   EOSABS 0.2 03/05/2022 1117   EOSABS 0.3 02/07/2020 0915   EOSABS 0.2 12/15/2013 0752   BASOSABS 0.1 03/05/2022 1117   BASOSABS 0.1 02/07/2020 0915   BASOSABS 0.0 12/15/2013 0752    A/P: 1. Medication review: Patient is currently on Tecfidera for MS and is tolerating it well. Reviewed the medication with the patient, including the following: dimethyl fumarate activates the Nrf2 pathway, which is believed to result in anti-inflammatory and cytoprotective properties. The medication is oral and should be swallowed whole. Administering with a high-fat, high-protein meal may decrease flushing and GI side effects. Possible adverse effects include skin flushing, pruritus, GI upset, albuminura, infection, lymphocytopenia, and increased liver transaminases. Cases of PML have been reported with severe, long-standing lymphopenia identified as the primary risk for PML. Dose adjustments for toxicities have been summarized above. No recommendations for any changes  at this time.  Benard Halsted, PharmD, [facility] [place], [place] [phone]

## 2022-06-03 ENCOUNTER — Other Ambulatory Visit (HOSPITAL_COMMUNITY): Payer: Self-pay

## 2022-06-06 ENCOUNTER — Other Ambulatory Visit (HOSPITAL_COMMUNITY): Payer: Self-pay

## 2022-06-09 ENCOUNTER — Other Ambulatory Visit: Payer: Self-pay

## 2022-06-10 ENCOUNTER — Other Ambulatory Visit (HOSPITAL_COMMUNITY): Payer: Self-pay

## 2022-06-10 ENCOUNTER — Encounter (HOSPITAL_COMMUNITY): Payer: Self-pay

## 2022-06-20 DIAGNOSIS — L821 Other seborrheic keratosis: Secondary | ICD-10-CM | POA: Diagnosis not present

## 2022-06-20 DIAGNOSIS — D225 Melanocytic nevi of trunk: Secondary | ICD-10-CM | POA: Diagnosis not present

## 2022-06-20 DIAGNOSIS — L738 Other specified follicular disorders: Secondary | ICD-10-CM | POA: Diagnosis not present

## 2022-06-20 DIAGNOSIS — D2262 Melanocytic nevi of left upper limb, including shoulder: Secondary | ICD-10-CM | POA: Diagnosis not present

## 2022-06-20 DIAGNOSIS — D2271 Melanocytic nevi of right lower limb, including hip: Secondary | ICD-10-CM | POA: Diagnosis not present

## 2022-06-20 DIAGNOSIS — D2261 Melanocytic nevi of right upper limb, including shoulder: Secondary | ICD-10-CM | POA: Diagnosis not present

## 2022-06-20 DIAGNOSIS — D2272 Melanocytic nevi of left lower limb, including hip: Secondary | ICD-10-CM | POA: Diagnosis not present

## 2022-06-30 ENCOUNTER — Encounter: Payer: Self-pay | Admitting: Family Medicine

## 2022-06-30 ENCOUNTER — Other Ambulatory Visit: Payer: Self-pay | Admitting: Family Medicine

## 2022-06-30 ENCOUNTER — Ambulatory Visit (INDEPENDENT_AMBULATORY_CARE_PROVIDER_SITE_OTHER): Payer: Commercial Managed Care - PPO | Admitting: Family Medicine

## 2022-06-30 VITALS — BP 116/72 | HR 68 | Temp 98.0°F | Ht 67.5 in | Wt 184.2 lb

## 2022-06-30 DIAGNOSIS — Z1211 Encounter for screening for malignant neoplasm of colon: Secondary | ICD-10-CM | POA: Diagnosis not present

## 2022-06-30 DIAGNOSIS — E559 Vitamin D deficiency, unspecified: Secondary | ICD-10-CM

## 2022-06-30 DIAGNOSIS — G35 Multiple sclerosis: Secondary | ICD-10-CM | POA: Diagnosis not present

## 2022-06-30 DIAGNOSIS — Z0001 Encounter for general adult medical examination with abnormal findings: Secondary | ICD-10-CM

## 2022-06-30 DIAGNOSIS — H6120 Impacted cerumen, unspecified ear: Secondary | ICD-10-CM | POA: Insufficient documentation

## 2022-06-30 DIAGNOSIS — H6122 Impacted cerumen, left ear: Secondary | ICD-10-CM

## 2022-06-30 DIAGNOSIS — Z1231 Encounter for screening mammogram for malignant neoplasm of breast: Secondary | ICD-10-CM

## 2022-06-30 DIAGNOSIS — Z13220 Encounter for screening for lipoid disorders: Secondary | ICD-10-CM | POA: Diagnosis not present

## 2022-06-30 NOTE — Progress Notes (Addendum)
Marikay Alar, MD Phone: [phone]  Maria Jennings is a 52 y.o. female who presents today for CPE.  Diet: very healthy Exercise: pilates 2x/week Pap smear: 11/05/18 NILM neg HPV sees GYN Colonoscopy: due Mammogram: scheduled Family history-  Colon cancer: no  Breast cancer: no  Ovarian cancer: no Menses: irregular, is perimenopausal and went 8 months without a cycle, notes GYN is aware Vaccines-   Flu: UTD  Tetanus: not sure when this was last done  Shingles: declines  COVID19: UTD HIV screening: UTD Hep C Screening: UTD Tobacco use: no Alcohol use: 4 glasses of wine per week Illicit Drug use: no Dentist: yes Ophthalmology: yes Cerumen impaction: Patient reports it feels like she has earwax in her left ear.  Active Ambulatory Problems    Diagnosis Date Noted   Encounter for general adult medical examination with abnormal findings 08/16/2012   Optic neuritis 11/04/2013   Multiple sclerosis 11/04/2013   Atypical chest pain 07/06/2015   DOE (dyspnea on exertion) 07/06/2015   Vitamin D deficiency 06/11/2016   High risk medication use 06/05/2014   Abnormal mammogram 01/15/2018   Encounter for screening for COVID-19 05/03/2019   Lipid screening 01/10/2020   Allergic rhinitis 11/29/2020   Acute pain of right shoulder 06/26/2021   Winged scapula 06/26/2021   Nausea 06/26/2021   Cerumen impaction 06/30/2022   Resolved Ambulatory Problems    Diagnosis Date Noted   Urinary tract infection 06/25/2011   UTI (urinary tract infection) 10/06/2012   Fever, unspecified 10/17/2013   Left maxillary sinusitis 10/17/2013   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   GERD (gastroesophageal reflux disease) 11/23/2014   Sinusitis, acute maxillary 07/18/2015   Past Medical History:  Diagnosis Date   Lumbago    Sinusitis    Vitamin D deficiency     Family History  Problem Relation Age of Onset   Hypertension Mother    Hyperlipidemia Mother    Hypertension Father     Cancer Maternal Grandmother 26       colon   Cancer Cousin 72       colon   Breast cancer Neg Hx     Social History   Socioeconomic History   Marital status: Married    Spouse name: Not on file   Number of children: 2   Years of education: Not on file   Highest education level: Not on file  Occupational History    Employer: [employer]  Tobacco Use   Smoking status: Never   Smokeless tobacco: Never  Substance and Sexual Activity   Alcohol use: Yes    Alcohol/week: 5.0 standard drinks of alcohol    Types: 5 Glasses of wine per week   Drug use: No   Sexual activity: Yes    Partners: Male    Birth control/protection: None  Other Topics Concern   Not on file  Social History Narrative   Lives in [place].      Work - Immunologist      Married       From [place]   Social Determinants of Corporate investment banker Strain: Not on [employer] Insecurity: Not on file  Transportation Needs: Not on file  Physical Activity: Not on file  Stress: Not on file  Social Connections: Not on file  Intimate Partner Violence: Not on file    ROS  General:  Negative for nexplained weight loss, fever Skin: Negative for new or changing mole, sore that won't heal HEENT: Negative for trouble  hearing, trouble seeing, ringing in ears, mouth sores, hoarseness, change in voice, dysphagia. CV:  Negative for chest pain, dyspnea, edema, palpitations Resp: Negative for cough, dyspnea, hemoptysis GI: Negative for nausea, vomiting, diarrhea, constipation, abdominal pain, melena, hematochezia. GU: Negative for dysuria, incontinence, urinary hesitance, hematuria, vaginal or penile discharge, polyuria, sexual difficulty, lumps in testicle or breasts MSK: Negative for muscle cramps or aches, joint pain or swelling Neuro: Negative for headaches, weakness, numbness, dizziness, passing out/fainting Psych: Negative for depression, anxiety, memory problems  Objective  Physical Exam Vitals:   06/30/22  1619  BP: 116/72  Pulse: 68  Temp: 98 F (36.7 C)  SpO2: 99%    BP Readings from Last 3 Encounters:  06/30/22 116/72  02/12/22 110/72  06/26/21 130/80   Wt Readings from Last 3 Encounters:  06/30/22 184 lb 3.2 oz (83.6 kg)  02/12/22 188 lb (85.3 kg)  06/26/21 182 lb 6.4 oz (82.7 kg)    Physical Exam Constitutional:      General: She is not in acute distress.    Appearance: She is not diaphoretic.  HENT:     Right Ear: Tympanic membrane and ear canal normal.     Ears:     Comments: Cerumen obscures the left TM Cardiovascular:     Rate and Rhythm: Normal rate and regular rhythm.     Heart sounds: Normal heart sounds.  Pulmonary:     Effort: Pulmonary effort is normal.     Breath sounds: Normal breath sounds.  Abdominal:     General: Bowel sounds are normal. There is no distension.     Palpations: Abdomen is soft.     Tenderness: There is no abdominal tenderness.  Musculoskeletal:     Right lower leg: No edema.     Left lower leg: No edema.  Lymphadenopathy:     Cervical: No cervical adenopathy.  Skin:    General: Skin is warm and dry.  Neurological:     Mental Status: She is alert.  Psychiatric:        Mood and Affect: Mood normal.      Assessment/Plan:   Encounter for general adult medical examination with abnormal findings Assessment & Plan: Physical exam completed.  I encouraged continued healthy diet and exercise.  She will have her mammogram as scheduled.  I will refer her for her colonoscopy.  She will check with employee health regarding her tetanus vaccine.  She declines Shingrix vaccination.  Lab work as outlined.   Impacted cerumen of left ear Assessment & Plan: Irrigated by CMA.  Patient tolerated this well.  Cerumen is still present in the ear canal.  She will try Debrox over-the-counter.  If not beneficial we could refer to ENT.   Vitamin D deficiency -     VITAMIN D 25 Hydroxy (Vit-D Deficiency, Fractures); Future  Multiple sclerosis  (HCC) -     Vitamin B12; Future -     CBC with Differential/Platelet; Future  Colon cancer screening -     Ambulatory referral to Gastroenterology  Lipid screening -     Comprehensive metabolic panel; Future -     Lipid panel; Future    Return in about 1 year (around 06/30/2023) for physical.   Marikay Alar, MD [facility]

## 2022-06-30 NOTE — Assessment & Plan Note (Signed)
Physical exam completed.  I encouraged continued healthy diet and exercise.  She will have her mammogram as scheduled.  I will refer her for her colonoscopy.  She will check with employee health regarding her tetanus vaccine.  She declines Shingrix vaccination.  Lab work as outlined.

## 2022-06-30 NOTE — Assessment & Plan Note (Addendum)
Irrigated by [employer]  Patient tolerated this well.  Cerumen is still present in the ear canal.  She will try Debrox over-the-counter.  If not beneficial we could refer to ENT.

## 2022-06-30 NOTE — Patient Instructions (Signed)
Nice to see you. ?We will contact you with your lab results. ?

## 2022-07-01 ENCOUNTER — Other Ambulatory Visit (HOSPITAL_COMMUNITY): Payer: Self-pay

## 2022-07-08 ENCOUNTER — Other Ambulatory Visit: Payer: Self-pay

## 2022-07-10 ENCOUNTER — Encounter: Payer: Self-pay | Admitting: *Deleted

## 2022-07-14 ENCOUNTER — Other Ambulatory Visit (HOSPITAL_COMMUNITY): Payer: Self-pay

## 2022-07-16 ENCOUNTER — Ambulatory Visit
Admission: RE | Admit: 2022-07-16 | Discharge: 2022-07-16 | Disposition: A | Discharge status: Home / Self Care | Payer: Commercial Managed Care - PPO | Source: Ambulatory Visit | Attending: Family Medicine | Admitting: Family Medicine

## 2022-07-16 DIAGNOSIS — Z1231 Encounter for screening mammogram for malignant neoplasm of breast: Secondary | ICD-10-CM | POA: Diagnosis not present

## 2022-07-21 ENCOUNTER — Encounter: Payer: Self-pay | Admitting: Family Medicine

## 2022-07-21 ENCOUNTER — Other Ambulatory Visit (INDEPENDENT_AMBULATORY_CARE_PROVIDER_SITE_OTHER): Payer: Commercial Managed Care - PPO

## 2022-07-21 DIAGNOSIS — G35 Multiple sclerosis: Secondary | ICD-10-CM

## 2022-07-21 DIAGNOSIS — E559 Vitamin D deficiency, unspecified: Secondary | ICD-10-CM | POA: Diagnosis not present

## 2022-07-21 DIAGNOSIS — Z13220 Encounter for screening for lipoid disorders: Secondary | ICD-10-CM | POA: Diagnosis not present

## 2022-07-21 NOTE — Addendum Note (Signed)
Addended by: Glori Luis on: 07/21/2022 09:44 AM   Modules accepted: Orders

## 2022-07-22 ENCOUNTER — Other Ambulatory Visit: Payer: Self-pay

## 2022-07-22 LAB — COMPREHENSIVE METABOLIC PANEL
ALT: 13 U/L (ref 0–35)
AST: 14 U/L (ref 0–37)
Albumin: 4.7 g/dL (ref 3.5–5.2)
Alkaline Phosphatase: 72 U/L (ref 39–117)
BUN: 13 mg/dL (ref 6–23)
CO2: 26 mEq/L (ref 19–32)
Calcium: 9.9 mg/dL (ref 8.4–10.5)
Chloride: 101 mEq/L (ref 96–112)
Creatinine, Ser: 0.69 mg/dL (ref 0.40–1.20)
GFR: 98.56 mL/min (ref >60.00)
Glucose, Bld: 80 mg/dL (ref 70–99)
Potassium: 4.3 mEq/L (ref 3.5–5.1)
Sodium: 138 mEq/L (ref 135–145)
Total Bilirubin: 0.7 mg/dL (ref 0.2–1.2)
Total Protein: 6.9 g/dL (ref 6.0–8.3)

## 2022-07-22 LAB — LIPID PANEL
Cholesterol: 196 mg/dL (ref 0–200)
HDL: 62.2 mg/dL (ref >39.00)
LDL Cholesterol: 115 mg/dL — ABNORMAL HIGH (ref 0–99)
NonHDL: 133.79
Total CHOL/HDL Ratio: 3
Triglycerides: 92 mg/dL (ref 0.0–149.0)
VLDL: 18.4 mg/dL (ref 0.0–40.0)

## 2022-07-22 LAB — CBC WITH DIFFERENTIAL/PLATELET
Basophils Absolute: 0 10*3/uL (ref 0.0–0.1)
Basophils Relative: 0.6 % (ref 0.0–3.0)
Eosinophils Absolute: 0.2 10*3/uL (ref 0.0–0.7)
Eosinophils Relative: 3.3 % (ref 0.0–5.0)
HCT: 43.2 % (ref 36.0–46.0)
Hemoglobin: 14.9 g/dL (ref 12.0–15.0)
Lymphocytes Relative: 20.5 % (ref 12.0–46.0)
Lymphs Abs: 1.4 10*3/uL (ref 0.7–4.0)
MCHC: 34.6 g/dL (ref 30.0–36.0)
MCV: 94.5 fl (ref 78.0–100.0)
Monocytes Absolute: 0.4 10*3/uL (ref 0.1–1.0)
Monocytes Relative: 6.1 % (ref 3.0–12.0)
Neutro Abs: 4.8 10*3/uL (ref 1.4–7.7)
Neutrophils Relative %: 69.5 % (ref 43.0–77.0)
Platelets: 243 10*3/uL (ref 150.0–400.0)
RBC: 4.57 Mil/uL (ref 3.87–5.11)
RDW: 12.2 % (ref 11.5–15.5)
WBC: 7 10*3/uL (ref 4.0–10.5)

## 2022-07-22 LAB — VITAMIN B12: Vitamin B-12: 895 pg/mL (ref 211–911)

## 2022-07-22 LAB — VITAMIN D 25 HYDROXY (VIT D DEFICIENCY, FRACTURES): VITD: 34.67 ng/mL (ref 30.00–100.00)

## 2022-07-29 ENCOUNTER — Other Ambulatory Visit (HOSPITAL_COMMUNITY): Payer: Self-pay

## 2022-08-01 ENCOUNTER — Other Ambulatory Visit (HOSPITAL_COMMUNITY): Payer: Self-pay

## 2022-08-01 ENCOUNTER — Other Ambulatory Visit: Payer: Self-pay

## 2022-08-27 ENCOUNTER — Other Ambulatory Visit (HOSPITAL_COMMUNITY): Payer: Self-pay

## 2022-09-01 ENCOUNTER — Other Ambulatory Visit: Payer: Self-pay

## 2022-09-01 ENCOUNTER — Other Ambulatory Visit (HOSPITAL_COMMUNITY): Payer: Self-pay

## 2022-09-01 ENCOUNTER — Other Ambulatory Visit: Payer: Self-pay | Admitting: Pharmacist

## 2022-09-01 MED ORDER — DIMETHYL FUMARATE 240 MG PO CPDR: DELAYED_RELEASE_CAPSULE | Freq: Two times a day (BID) | ORAL | 3 refills | Status: DC

## 2022-09-01 MED ORDER — DIMETHYL FUMARATE 240 MG PO CPDR
DELAYED_RELEASE_CAPSULE | Freq: Two times a day (BID) | ORAL | 3 refills | Status: DC
  Filled 2022-09-01: qty 60, 30d supply, fill #0
  Filled 2022-09-26: qty 60, 30d supply, fill #1
  Filled 2022-10-31: qty 60, 30d supply, fill #2
  Filled 2022-12-03: qty 60, 30d supply, fill #3
  Filled 2023-01-01: qty 60, 30d supply, fill #4
  Filled 2023-01-30: qty 60, 30d supply, fill #5
  Filled 2023-02-19: qty 60, 30d supply, fill #6
  Filled 2023-03-27: qty 60, 30d supply, fill #7
  Filled 2023-04-27: qty 60, 30d supply, fill #8
  Filled 2023-06-01: qty 60, 30d supply, fill #9
  Filled 2023-07-01: qty 60, 30d supply, fill #10
  Filled 2023-08-10: qty 60, 30d supply, fill #11
  Filled 2023-09-01: qty 60, 30d supply, fill #12

## 2022-09-02 ENCOUNTER — Other Ambulatory Visit: Payer: Self-pay

## 2022-09-03 ENCOUNTER — Other Ambulatory Visit (HOSPITAL_COMMUNITY): Payer: Self-pay

## 2022-09-04 ENCOUNTER — Other Ambulatory Visit (HOSPITAL_COMMUNITY): Payer: Self-pay

## 2022-09-24 ENCOUNTER — Other Ambulatory Visit: Payer: Self-pay

## 2022-09-26 ENCOUNTER — Other Ambulatory Visit: Payer: Self-pay

## 2022-09-26 ENCOUNTER — Other Ambulatory Visit (HOSPITAL_COMMUNITY): Payer: Self-pay

## 2022-10-01 ENCOUNTER — Other Ambulatory Visit (HOSPITAL_COMMUNITY): Payer: Self-pay

## 2022-10-22 ENCOUNTER — Other Ambulatory Visit: Payer: Self-pay

## 2022-10-24 ENCOUNTER — Other Ambulatory Visit: Payer: Self-pay

## 2022-10-28 ENCOUNTER — Other Ambulatory Visit: Payer: Self-pay

## 2022-10-31 ENCOUNTER — Other Ambulatory Visit: Payer: Self-pay

## 2022-10-31 ENCOUNTER — Other Ambulatory Visit (HOSPITAL_COMMUNITY): Payer: Self-pay

## 2022-11-04 ENCOUNTER — Other Ambulatory Visit (HOSPITAL_COMMUNITY): Payer: Self-pay

## 2022-11-05 ENCOUNTER — Encounter (HOSPITAL_COMMUNITY): Payer: Self-pay

## 2022-11-05 ENCOUNTER — Other Ambulatory Visit (HOSPITAL_COMMUNITY): Payer: Self-pay

## 2022-12-03 ENCOUNTER — Other Ambulatory Visit (HOSPITAL_COMMUNITY): Payer: Self-pay

## 2022-12-03 ENCOUNTER — Other Ambulatory Visit: Payer: Self-pay

## 2022-12-05 ENCOUNTER — Other Ambulatory Visit (HOSPITAL_COMMUNITY): Payer: Self-pay

## 2022-12-14 ENCOUNTER — Emergency Department (HOSPITAL_BASED_OUTPATIENT_CLINIC_OR_DEPARTMENT_OTHER)
Admission: EM | Admit: 2022-12-14 | Discharge: 2022-12-14 | Disposition: A | Discharge status: Home / Self Care | Payer: Commercial Managed Care - PPO | Attending: Emergency Medicine | Admitting: Emergency Medicine

## 2022-12-14 ENCOUNTER — Encounter (HOSPITAL_BASED_OUTPATIENT_CLINIC_OR_DEPARTMENT_OTHER): Payer: Self-pay | Admitting: Emergency Medicine

## 2022-12-14 ENCOUNTER — Emergency Department (HOSPITAL_BASED_OUTPATIENT_CLINIC_OR_DEPARTMENT_OTHER): Payer: Commercial Managed Care - PPO

## 2022-12-14 ENCOUNTER — Other Ambulatory Visit: Payer: Self-pay

## 2022-12-14 DIAGNOSIS — R519 Headache, unspecified: Secondary | ICD-10-CM

## 2022-12-14 DIAGNOSIS — Z1152 Encounter for screening for COVID-19: Secondary | ICD-10-CM | POA: Diagnosis not present

## 2022-12-14 DIAGNOSIS — J984 Other disorders of lung: Secondary | ICD-10-CM | POA: Diagnosis not present

## 2022-12-14 DIAGNOSIS — R059 Cough, unspecified: Secondary | ICD-10-CM | POA: Diagnosis not present

## 2022-12-14 DIAGNOSIS — R051 Acute cough: Secondary | ICD-10-CM

## 2022-12-14 DIAGNOSIS — J189 Pneumonia, unspecified organism: Secondary | ICD-10-CM

## 2022-12-14 DIAGNOSIS — R509 Fever, unspecified: Secondary | ICD-10-CM | POA: Diagnosis present

## 2022-12-14 LAB — CBC WITH DIFFERENTIAL/PLATELET
Abs Immature Granulocytes: 0.01 10*3/uL (ref 0.00–0.07)
Basophils Absolute: 0 10*3/uL (ref 0.0–0.1)
Basophils Relative: 0 %
Eosinophils Absolute: 0 10*3/uL (ref 0.0–0.5)
Eosinophils Relative: 0 %
HCT: 41.9 % (ref 36.0–46.0)
Hemoglobin: 14.9 g/dL (ref 12.0–15.0)
Immature Granulocytes: 0 %
Lymphocytes Relative: 13 %
Lymphs Abs: 0.7 10*3/uL (ref 0.7–4.0)
MCH: 32.8 pg (ref 26.0–34.0)
MCHC: 35.6 g/dL (ref 30.0–36.0)
MCV: 92.3 fL (ref 80.0–100.0)
Monocytes Absolute: 0.6 10*3/uL (ref 0.1–1.0)
Monocytes Relative: 10 %
Neutro Abs: 4.3 10*3/uL (ref 1.7–7.7)
Neutrophils Relative %: 77 %
Platelets: 152 10*3/uL (ref 150–400)
RBC: 4.54 MIL/uL (ref 3.87–5.11)
RDW: 11.9 % (ref 11.5–15.5)
WBC: 5.6 10*3/uL (ref 4.0–10.5)
nRBC: 0 % (ref 0.0–0.2)

## 2022-12-14 LAB — BASIC METABOLIC PANEL
Anion gap: 13 (ref 5–15)
BUN: 10 mg/dL (ref 6–20)
CO2: 23 mmol/L (ref 22–32)
Calcium: 9.1 mg/dL (ref 8.9–10.3)
Chloride: 101 mmol/L (ref 98–111)
Creatinine, Ser: 0.48 mg/dL (ref 0.44–1.00)
GFR, Estimated: >60 mL/min (ref >60)
Glucose, Bld: 108 mg/dL — ABNORMAL HIGH (ref 70–99)
Potassium: 4 mmol/L (ref 3.5–5.1)
Sodium: 137 mmol/L (ref 135–145)

## 2022-12-14 LAB — SARS CORONAVIRUS 2 BY RT PCR: SARS Coronavirus 2 by RT PCR: NEGATIVE

## 2022-12-14 MED ORDER — GUAIFENESIN-DM 100-10 MG/5ML PO SYRP: 5.0000 mL | ORAL_SOLUTION | ORAL | 0 refills | Status: DC | PRN

## 2022-12-14 MED ORDER — PROCHLORPERAZINE EDISYLATE 10 MG/2ML IJ SOLN
5.0000 mg | Freq: Once | INTRAMUSCULAR | Status: AC
  Administered 2022-12-14: 5 mg via INTRAVENOUS
  Filled 2022-12-14: qty 2

## 2022-12-14 MED ORDER — IPRATROPIUM-ALBUTEROL 0.5-2.5 (3) MG/3ML IN SOLN
3.0000 mL | Freq: Once | RESPIRATORY_TRACT | Status: AC
  Administered 2022-12-14: 3 mL via RESPIRATORY_TRACT
  Filled 2022-12-14: qty 3

## 2022-12-14 MED ORDER — SODIUM CHLORIDE 0.9 % IV SOLN
1.0000 g | Freq: Once | INTRAVENOUS | Status: DC
  Filled 2022-12-14: qty 10

## 2022-12-14 MED ORDER — DIPHENHYDRAMINE HCL 50 MG/ML IJ SOLN
25.0000 mg | Freq: Once | INTRAMUSCULAR | Status: AC
  Administered 2022-12-14: 25 mg via INTRAVENOUS
  Filled 2022-12-14: qty 1

## 2022-12-14 MED ORDER — SODIUM CHLORIDE 0.9 % IV BOLUS
1000.0000 mL | Freq: Once | INTRAVENOUS | Status: AC
  Administered 2022-12-14: 1000 mL via INTRAVENOUS

## 2022-12-14 MED ORDER — IBUPROFEN 600 MG PO TABS: 600.0000 mg | ORAL_TABLET | Freq: Four times a day (QID) | ORAL | 0 refills | Status: AC | PRN

## 2022-12-14 MED ORDER — OXYMETAZOLINE HCL 0.05 % NA SOLN: 1.0000 | Freq: Two times a day (BID) | NASAL | 0 refills | Status: AC

## 2022-12-14 MED ORDER — LEVOFLOXACIN IN D5W 750 MG/150ML IV SOLN
750.0000 mg | Freq: Once | INTRAVENOUS | Status: AC
  Administered 2022-12-14: 750 mg via INTRAVENOUS
  Filled 2022-12-14: qty 150

## 2022-12-14 MED ORDER — ACETAMINOPHEN 500 MG PO TABS
1000.0000 mg | ORAL_TABLET | Freq: Once | ORAL | Status: AC
  Administered 2022-12-14: 1000 mg via ORAL
  Filled 2022-12-14: qty 2

## 2022-12-14 MED ORDER — ALBUTEROL SULFATE HFA 108 (90 BASE) MCG/ACT IN AERS: 1.0000 | INHALATION_SPRAY | Freq: Four times a day (QID) | RESPIRATORY_TRACT | 0 refills | Status: DC | PRN

## 2022-12-14 MED ORDER — LEVOFLOXACIN 750 MG PO TABS: 750.0000 mg | ORAL_TABLET | Freq: Every day | ORAL | 0 refills | Status: AC

## 2022-12-14 MED ORDER — LIDOCAINE 5 % EX PTCH
1.0000 | MEDICATED_PATCH | Freq: Once | CUTANEOUS | Status: DC
  Administered 2022-12-14: 1 via TRANSDERMAL
  Filled 2022-12-14: qty 1

## 2022-12-14 MED ORDER — KETOROLAC TROMETHAMINE 15 MG/ML IJ SOLN
15.0000 mg | Freq: Once | INTRAMUSCULAR | Status: AC
  Administered 2022-12-14: 15 mg via INTRAVENOUS
  Filled 2022-12-14: qty 1

## 2022-12-14 MED ORDER — ALBUTEROL SULFATE HFA 108 (90 BASE) MCG/ACT IN AERS
INHALATION_SPRAY | RESPIRATORY_TRACT | Status: AC
  Filled 2022-12-14: qty 6.7

## 2022-12-14 MED ORDER — ALBUTEROL SULFATE HFA 108 (90 BASE) MCG/ACT IN AERS: 1.0000 | INHALATION_SPRAY | RESPIRATORY_TRACT | Status: DC | PRN

## 2022-12-14 MED ORDER — PROCHLORPERAZINE MALEATE 5 MG PO TABS
5.0000 mg | ORAL_TABLET | Freq: Two times a day (BID) | ORAL | 0 refills | Status: DC | PRN
Start: 2022-12-14 — End: 2022-12-26

## 2022-12-14 MED ORDER — ACETAMINOPHEN 325 MG PO TABS: 650.0000 mg | ORAL_TABLET | Freq: Four times a day (QID) | ORAL | 0 refills | Status: DC | PRN

## 2022-12-14 NOTE — Discharge Instructions (Addendum)
It was a pleasure caring for you today in the emergency department.  Your workup today is concerning for pneumonia.  Your symptoms should improve over the next few days after initiation of antibiotics.  If you develop worsening difficulty breathing, significant nausea and vomiting that prevent you from taking medications or eating/drinking regularly, chest pain, worsening headaches.  Any worsening / worrisome symptoms please return to the ER for evaluation.  Please return to the emergency department for any worsening or worrisome symptoms.

## 2022-12-14 NOTE — ED Triage Notes (Signed)
Thursday bodyache, fever, congestion,headache,cough. Daughter was here last Sunday with pneumonia. Fever 102 was highest.

## 2022-12-14 NOTE — ED Provider Notes (Signed)
[place] EMERGENCY DEPARTMENT AT [facility] Provider Note  CSN: [id number] Arrival date & time: 12/14/22 8295  Chief Complaint(s) Cough  HPI Maria Jennings is a 52 y.o. female with past medical history as below, significant for MS, optic neuritis, vitamin D deficiency who presents to the ED with complaint of flulike symptoms.  Patient here with complaint of flulike symptoms, starting Thursday.  Body aches, fevers, Tmax at home 102.  Headache, intermittently productive cough.  Sinus congestion and pressure. Daughter with similar complaints, recently admitted for PNA  Past Medical History Past Medical History:  Diagnosis Date   Lumbago    Multiple sclerosis (HCC)    symptom free 10 years   Sinusitis    Vitamin D deficiency    Patient Active Problem List   Diagnosis Date Noted   Cerumen impaction 06/30/2022   Acute pain of right shoulder 06/26/2021   Winged scapula 06/26/2021   Nausea 06/26/2021   Allergic rhinitis 11/29/2020   Lipid screening 01/10/2020   Encounter for screening for COVID-19 05/03/2019   Abnormal mammogram 01/15/2018   Vitamin D deficiency 06/11/2016   Atypical chest pain 07/06/2015   DOE (dyspnea on exertion) 07/06/2015   High risk medication use 06/05/2014   Optic neuritis 11/04/2013   Multiple sclerosis (HCC) 11/04/2013   Encounter for general adult medical examination with abnormal findings 08/16/2012   Home Medication(s) Prior to Admission medications   Medication Sig Start Date End Date Taking? Authorizing Provider  acetaminophen (TYLENOL) 325 MG tablet Take 2 tablets (650 mg total) by mouth every 6 (six) hours as needed. 12/14/22  Yes Tanda Rockers A, DO  albuterol (VENTOLIN HFA) 108 (90 Base) MCG/ACT inhaler Inhale 1-2 puffs into the lungs every 6 (six) hours as needed for wheezing or shortness of breath. 12/14/22  Yes Tanda Rockers A, DO  guaiFENesin-dextromethorphan (ROBITUSSIN DM) 100-10 MG/5ML syrup Take 5 mLs by mouth every 4 (four) hours  as needed for cough. 12/14/22  Yes Tanda Rockers A, DO  ibuprofen (ADVIL) 600 MG tablet Take 1 tablet (600 mg total) by mouth every 6 (six) hours as needed. 12/14/22  Yes Sloan Leiter, DO  levofloxacin (LEVAQUIN) 750 MG tablet Take 1 tablet (750 mg total) by mouth daily for 4 days. 12/15/22 12/19/22 Yes Sloan Leiter, DO  oxymetazoline (AFRIN NASAL SPRAY) 0.05 % nasal spray Place 1 spray into both nostrils 2 (two) times daily for 3 days. 12/14/22 12/17/22 Yes Sloan Leiter, DO  prochlorperazine (COMPAZINE) 5 MG tablet Take 1 tablet (5 mg total) by mouth 2 (two) times daily as needed for up to 5 doses for nausea (headache). 12/14/22  Yes Sloan Leiter, DO  Cholecalciferol (VITAMIN D3) 5000 units CAPS Take 1 capsule by mouth 2 (two) times a week.    [provider]  cyanocobalamin 1000 MCG tablet Take 3,000 mcg by mouth 2 (two) times a week.     [provider]  Dimethyl Fumarate 240 MG CPDR Take 1 capsule by mouth twice a day. 09/01/22   Quentin Angst, MD  fexofenadine-pseudoephedrine (ALLEGRA-D 24) 180-240 MG 24 hr tablet Take by mouth as needed. 07/13/15   [provider]  Past Surgical History Past Surgical History:  Procedure Laterality Date   CESAREAN SECTION     x 1   KNEE ARTHROSCOPY Right 07/2013   Family History Family History  Problem Relation Age of Onset   Hypertension Mother    Hyperlipidemia Mother    Hypertension Father    Cancer Maternal Grandmother 19       colon   Cancer Cousin 20       colon   Breast cancer Neg Hx     Social History Social History   Tobacco Use   Smoking status: Never   Smokeless tobacco: Never  Substance Use Topics   Alcohol use: Yes    Alcohol/week: 5.0 standard drinks of alcohol    Types: 5 Glasses of wine per week   Drug use: No   Allergies Amoxicillin, Azithromycin, and Ciprofloxacin  hcl  Review of Systems Review of Systems  Constitutional:  Positive for fatigue and fever.  HENT:  Positive for congestion, sinus pressure and sinus pain.   Respiratory:  Positive for cough and shortness of breath.   Cardiovascular:  Negative for chest pain and palpitations.  Gastrointestinal:  Negative for nausea and vomiting.  All other systems reviewed and are negative.   Physical Exam Vital Signs  I have reviewed the triage vital signs BP (!) 147/105   Pulse 97   Temp 98.5 F (36.9 C) (Oral)   Resp 20   SpO2 96%  Physical Exam Vitals and nursing note reviewed.  Constitutional:      General: She is not in acute distress.    Appearance: Normal appearance.  HENT:     Head: Normocephalic and atraumatic.     Right Ear: External ear normal.     Left Ear: External ear normal.     Nose: Nose normal.     Mouth/Throat:     Mouth: Mucous membranes are moist.  Eyes:     General: No scleral icterus.       Right eye: No discharge.        Left eye: No discharge.  Cardiovascular:     Rate and Rhythm: Normal rate and regular rhythm.     Pulses: Normal pulses.     Heart sounds: Normal heart sounds.  Pulmonary:     Effort: Pulmonary effort is normal. No respiratory distress.     Breath sounds: Normal breath sounds. No stridor.  Abdominal:     General: Abdomen is flat. There is no distension.     Palpations: Abdomen is soft.     Tenderness: There is no abdominal tenderness.  Musculoskeletal:     Cervical back: No rigidity.     Right lower leg: No edema.     Left lower leg: No edema.  Skin:    General: Skin is warm and dry.     Capillary Refill: Capillary refill takes less than 2 seconds.  Neurological:     Mental Status: She is alert.  Psychiatric:        Mood and Affect: Mood normal.        Behavior: Behavior normal. Behavior is cooperative.     ED Results and Treatments Labs (all labs ordered are listed, but only abnormal results are displayed) Labs Reviewed   BASIC METABOLIC PANEL - Abnormal; Notable for the following components:      Result Value   Glucose, Bld 108 (*)    All other components within normal limits  SARS CORONAVIRUS 2 BY RT PCR  CBC WITH DIFFERENTIAL/PLATELET  Radiology DG Chest Portable 1 View  Result Date: 12/14/2022 CLINICAL DATA:  cough EXAM: PORTABLE CHEST - 1 VIEW COMPARISON:  07/06/2015 FINDINGS: New coarse airspace opacities at the left lung base. Right lung clear. Heart size and mediastinal contours are within normal limits. No effusion. Visualized bones unremarkable. IMPRESSION: Left basilar airspace disease. Electronically Signed   By: Corlis Leak M.D.   On: 12/14/2022 08:59    Pertinent labs & imaging results that were available during my care of the patient were reviewed by me and considered in my medical decision making (see MDM for details).  Medications Ordered in ED Medications  albuterol (VENTOLIN HFA) 108 (90 Base) MCG/ACT inhaler 1-2 puff (has no administration in time range)  lidocaine (LIDODERM) 5 % 1 patch (1 patch Transdermal Patch Applied 12/14/22 1217)  ketorolac (TORADOL) 15 MG/ML injection 15 mg (15 mg Intravenous Given 12/14/22 1048)  sodium chloride 0.9 % bolus 1,000 mL (1,000 mLs Intravenous New Bag/Given 12/14/22 1047)  ipratropium-albuterol (DUONEB) 0.5-2.5 (3) MG/3ML nebulizer solution 3 mL (3 mLs Nebulization Given 12/14/22 1019)  albuterol (VENTOLIN HFA) 108 (90 Base) MCG/ACT inhaler (  Given 12/14/22 1044)  levofloxacin (LEVAQUIN) IVPB 750 mg (750 mg Intravenous New Bag/Given 12/14/22 1102)  acetaminophen (TYLENOL) tablet 1,000 mg (1,000 mg Oral Given 12/14/22 1217)  prochlorperazine (COMPAZINE) injection 5 mg (5 mg Intravenous Given 12/14/22 1212)  diphenhydrAMINE (BENADRYL) injection 25 mg (25 mg Intravenous Given 12/14/22 1211)                                                                                                                                      Procedures Procedures  (including critical care time)  Medical Decision Making / ED Course    Medical Decision Making:    Kayti Vaccarello is a 52 y.o. female with past medical history as below, significant for MS, optic neuritis, vitamin D deficiency who presents to the ED with complaint of flulike symptoms.. The complaint involves an extensive differential diagnosis and also carries with it a high risk of complications and morbidity.  Serious etiology was considered. Ddx includes but is not limited to: In my evaluation of this patient's dyspnea my DDx includes, but is not limited to, pneumonia, pulmonary embolism, pneumothorax, pulmonary edema, metabolic acidosis, asthma, COPD, cardiac cause, anemia, anxiety, etc.    Complete initial physical exam performed, notably the patient  was NAD, sitting upright, no conversational dyspnea.    Reviewed and confirmed nursing documentation for past medical history, family history, social history.  Vital signs reviewed.   Patient found to have pneumonia on chest x-ray, will collect screening labs.  COVID test was negative.  Allergy list was reviewed,  Will give Toradol, DuoNeb, give first dose of antibiotics here, IV fluids.     X-ray concerning for pneumonia.  Labs stable.  Feeling better after intervention.  Tolerant p.o. intake.  Tolerate Levaquin without difficulty.  Reviewed allergy list. No  hypoxia, no vomiting.  Headache greatly improved.  Reasonable to treat patient outpatient setting for her pneumonia.  Her PSI class is 2.   Encourage close outpatient follow-up with PCP  The patient improved significantly and was discharged in stable condition. Detailed discussions were had with the patient regarding current findings, and need for close f/u with PCP or on call doctor. The patient has been instructed to return immediately if the symptoms worsen in any way for  re-evaluation. Patient verbalized understanding and is in agreement with current care plan. All questions answered prior to discharge.                 Additional history obtained: -Additional history obtained from spouse -External records from outside source obtained and reviewed including: Chart review including previous notes, labs, imaging, consultation notes including  Allergy list, primary care recommendation, prior labs and imaging   Lab Tests: -I ordered, reviewed, and interpreted labs.   The pertinent results include:   Labs Reviewed  BASIC METABOLIC PANEL - Abnormal; Notable for the following components:      Result Value   Glucose, Bld 108 (*)    All other components within normal limits  SARS CORONAVIRUS 2 BY RT PCR  CBC WITH DIFFERENTIAL/PLATELET    Notable for labs stable  EKG   EKG Interpretation Date/Time:    Ventricular Rate:    PR Interval:    QRS Duration:    QT Interval:    QTC Calculation:   R Axis:      Text Interpretation:           Imaging Studies ordered: I ordered imaging studies including chest x-ray I independently visualized the following imaging with scope of interpretation limited to determining acute life threatening conditions related to emergency care; findings noted above, significant for pneumonia I independently visualized and interpreted imaging. I agree with the radiologist interpretation   Medicines ordered and prescription drug management: Meds ordered this encounter  Medications   DISCONTD: cefTRIAXone (ROCEPHIN) 1 g in sodium chloride 0.9 % 100 mL IVPB    Order Specific Question:   Antibiotic Indication:    Answer:   CAP   ketorolac (TORADOL) 15 MG/ML injection 15 mg   sodium chloride 0.9 % bolus 1,000 mL   ipratropium-albuterol (DUONEB) 0.5-2.5 (3) MG/3ML nebulizer solution 3 mL   albuterol (VENTOLIN HFA) 108 (90 Base) MCG/ACT inhaler 1-2 puff   albuterol (VENTOLIN HFA) 108 (90 Base) MCG/ACT inhaler     [place]: cabinet override   levofloxacin (LEVAQUIN) IVPB 750 mg    Order Specific Question:   Antibiotic Indication:    Answer:   CAP   acetaminophen (TYLENOL) tablet 1,000 mg   prochlorperazine (COMPAZINE) injection 5 mg   diphenhydrAMINE (BENADRYL) injection 25 mg   lidocaine (LIDODERM) 5 % 1 patch   albuterol (VENTOLIN HFA) 108 (90 Base) MCG/ACT inhaler    Sig: Inhale 1-2 puffs into the lungs every 6 (six) hours as needed for wheezing or shortness of breath.    Dispense:  1 each    Refill:  0   guaiFENesin-dextromethorphan (ROBITUSSIN DM) 100-10 MG/5ML syrup    Sig: Take 5 mLs by mouth every 4 (four) hours as needed for cough.    Dispense:  118 mL    Refill:  0   prochlorperazine (COMPAZINE) 5 MG tablet    Sig: Take 1 tablet (5 mg total) by mouth 2 (two) times daily as needed for up to 5 doses for nausea (headache).  Dispense:  5 tablet    Refill:  0   levofloxacin (LEVAQUIN) 750 MG tablet    Sig: Take 1 tablet (750 mg total) by mouth daily for 4 days.    Dispense:  4 tablet    Refill:  0   oxymetazoline (AFRIN NASAL SPRAY) 0.05 % nasal spray    Sig: Place 1 spray into both nostrils 2 (two) times daily for 3 days.    Dispense:  15 mL    Refill:  0   ibuprofen (ADVIL) 600 MG tablet    Sig: Take 1 tablet (600 mg total) by mouth every 6 (six) hours as needed.    Dispense:  30 tablet    Refill:  0   acetaminophen (TYLENOL) 325 MG tablet    Sig: Take 2 tablets (650 mg total) by mouth every 6 (six) hours as needed.    Dispense:  36 tablet    Refill:  0    -I have reviewed the patients home medicines and have made adjustments as needed   Consultations Obtained: na   Cardiac Monitoring: Continuous pulse oximetry interpreted by myself, 96-98%% on RA.    Social Determinants of Health:  Diagnosis or treatment significantly limited by social determinants of health: non smoker   Reevaluation: After the interventions noted above, I reevaluated the patient and found  that they have improved  Co morbidities that complicate the patient evaluation  Past Medical History:  Diagnosis Date   Lumbago    Multiple sclerosis (HCC)    symptom free 10 years   Sinusitis    Vitamin D deficiency       Dispostion: Disposition decision including need for hospitalization was considered, and patient discharged from emergency department.    Final Clinical Impression(s) / ED Diagnoses Final diagnoses:  Community acquired pneumonia, unspecified laterality  Acute cough  Nonintractable headache, unspecified chronicity pattern, unspecified headache type        Sloan Leiter, DO 12/14/22 1250

## 2022-12-17 ENCOUNTER — Telehealth: Payer: Self-pay

## 2022-12-17 NOTE — Transitions of Care (Post Inpatient/ED Visit) (Signed)
Unable to reach pt by phone and left v/m requesting cb [phone].     12/17/2022  Name: Maria Jennings MRN: [id number] DOB: 1970/08/28  Today's TOC FU Call Status: Today's TOC FU Call Status:: Unsuccessful Call (1st Attempt) Unsuccessful Call (1st Attempt) Date: 12/17/22  Attempted to reach the patient regarding the most recent Inpatient/ED visit.  Follow Up Plan: Additional outreach attempts will be made to reach the patient to complete the Transitions of Care (Post Inpatient/ED visit) call.   Signature Lewanda Rife, LPN

## 2022-12-23 ENCOUNTER — Other Ambulatory Visit: Payer: Self-pay

## 2022-12-23 DIAGNOSIS — K130 Diseases of lips: Secondary | ICD-10-CM | POA: Diagnosis not present

## 2022-12-23 DIAGNOSIS — L821 Other seborrheic keratosis: Secondary | ICD-10-CM | POA: Diagnosis not present

## 2022-12-23 MED ORDER — CLOTRIMAZOLE 1 % EX CREA
1.0000 | TOPICAL_CREAM | Freq: Two times a day (BID) | CUTANEOUS | 1 refills | Status: DC
  Filled 2022-12-23: qty 30, 30d supply, fill #0

## 2022-12-23 MED ORDER — HYDROCORTISONE 2.5 % EX CREA
1.0000 | TOPICAL_CREAM | Freq: Two times a day (BID) | CUTANEOUS | 1 refills | Status: AC
  Filled 2022-12-23: qty 30, 30d supply, fill #0

## 2022-12-24 ENCOUNTER — Other Ambulatory Visit: Payer: Self-pay

## 2022-12-25 ENCOUNTER — Other Ambulatory Visit (HOSPITAL_COMMUNITY): Payer: Self-pay

## 2022-12-26 ENCOUNTER — Ambulatory Visit: Payer: Commercial Managed Care - PPO | Admitting: Family Medicine

## 2022-12-26 ENCOUNTER — Encounter: Payer: Self-pay | Admitting: Family Medicine

## 2022-12-26 VITALS — BP 118/74 | HR 86 | Temp 98.1°F | Ht 67.5 in | Wt 184.4 lb

## 2022-12-26 DIAGNOSIS — G35 Multiple sclerosis: Secondary | ICD-10-CM | POA: Diagnosis not present

## 2022-12-26 DIAGNOSIS — J189 Pneumonia, unspecified organism: Secondary | ICD-10-CM

## 2022-12-26 LAB — HEPATIC FUNCTION PANEL
ALT: 17 U/L (ref 0–35)
AST: 16 U/L (ref 0–37)
Albumin: 4.2 g/dL (ref 3.5–5.2)
Alkaline Phosphatase: 72 U/L (ref 39–117)
Bilirubin, Direct: 0.1 mg/dL (ref 0.0–0.3)
Total Bilirubin: 0.8 mg/dL (ref 0.2–1.2)
Total Protein: 6.9 g/dL (ref 6.0–8.3)

## 2022-12-26 LAB — VITAMIN B12: Vitamin B-12: 1056 pg/mL — ABNORMAL HIGH (ref 211–911)

## 2022-12-26 LAB — VITAMIN D 25 HYDROXY (VIT D DEFICIENCY, FRACTURES): VITD: 44.35 ng/mL (ref 30.00–100.00)

## 2022-12-26 NOTE — Progress Notes (Signed)
Marikay Alar, MD Phone: [phone]  Maria Jennings is a 52 y.o. female who presents today for follow-up.  Community-acquired pneumonia: Patient was evaluated in the emergency department a couple weeks ago.  She had developed cough, body aches, fever, and headache.  She was found to have pneumonia and was treated with Levaquin.  She notes she has improved significantly.  Still has some mild cough though this is progressively improving.  She finished her Levaquin.  She also requests her every 6 months labs to be done today for her medication monitoring for MS.  This is managed by neurology.  Social History   Tobacco Use  Smoking Status Never  Smokeless Tobacco Never    Current Outpatient Medications on File Prior to Visit  Medication Sig Dispense Refill   Cholecalciferol (VITAMIN D3) 5000 units CAPS Take 1 capsule by mouth 2 (two) times a week.     clotrimazole (LOTRIMIN) 1 % cream Apply 1 Application topically 2 (two) times daily to affected corners of mouth until clear 28 g 1   cyanocobalamin 1000 MCG tablet Take 3,000 mcg by mouth 2 (two) times a week.      Dimethyl Fumarate 240 MG CPDR Take 1 capsule by mouth twice a day. 360 capsule 3   fexofenadine-pseudoephedrine (ALLEGRA-D 24) 180-240 MG 24 hr tablet Take by mouth as needed.     hydrocortisone 2.5 % cream Apply 1 Application topically 2 (two) times daily to affected areas on mouth angles, mixed with clotrimazole cream as directed. 30 g 1   ibuprofen (ADVIL) 600 MG tablet Take 1 tablet (600 mg total) by mouth every 6 (six) hours as needed. 30 tablet 0   No current facility-administered medications on file prior to visit.     ROS see history of present illness  Objective  Physical Exam Vitals:   12/26/22 1132  BP: 118/74  Pulse: 86  Temp: 98.1 F (36.7 C)  SpO2: 98%    BP Readings from Last 3 Encounters:  12/26/22 118/74  12/14/22 (!) 147/105  06/30/22 116/72   Wt Readings from Last 3 Encounters:   12/26/22 184 lb 6.4 oz (83.6 kg)  06/30/22 184 lb 3.2 oz (83.6 kg)  02/12/22 188 lb (85.3 kg)    Physical Exam Constitutional:      General: She is not in acute distress.    Appearance: She is not diaphoretic.  Cardiovascular:     Rate and Rhythm: Normal rate and regular rhythm.     Heart sounds: Normal heart sounds.  Pulmonary:     Effort: Pulmonary effort is normal.     Breath sounds: Normal breath sounds.  Skin:    General: Skin is warm and dry.  Neurological:     Mental Status: She is alert.      Assessment/Plan: Please see individual problem list.  Multiple sclerosis (HCC) Assessment & Plan: Labs ordered.  She will continue to follow with neurology.  Orders: -     Vitamin B12 -     VITAMIN D 25 Hydroxy (Vit-D Deficiency, Fractures) -     Hepatic function panel  Community acquired pneumonia, unspecified laterality Assessment & Plan: Much improved.  Discussed cough should resolve over the next couple of weeks and if it does not she will let us know.  Chest x-ray to be completed in about 4 weeks to follow-up on resolution of her pneumonia.  Orders: -     DG Chest 2 View; Future    Return in about 4 weeks (around 01/23/2023)  for CXR.   Marikay Alar, MD [facility] Primary [facility]

## 2022-12-26 NOTE — Assessment & Plan Note (Signed)
Much improved.  Discussed cough should resolve over the next couple of weeks and if it does not she will let us know.  Chest x-ray to be completed in about 4 weeks to follow-up on resolution of her pneumonia.

## 2022-12-26 NOTE — Assessment & Plan Note (Signed)
Labs ordered.  She will continue to follow with neurology.

## 2022-12-29 ENCOUNTER — Other Ambulatory Visit (HOSPITAL_COMMUNITY): Payer: Self-pay

## 2022-12-31 ENCOUNTER — Encounter (HOSPITAL_COMMUNITY): Payer: Self-pay

## 2022-12-31 ENCOUNTER — Other Ambulatory Visit (HOSPITAL_COMMUNITY): Payer: Self-pay

## 2023-01-01 ENCOUNTER — Other Ambulatory Visit (HOSPITAL_COMMUNITY): Payer: Self-pay

## 2023-01-03 ENCOUNTER — Encounter (HOSPITAL_COMMUNITY): Payer: Self-pay

## 2023-01-06 ENCOUNTER — Other Ambulatory Visit: Payer: Self-pay

## 2023-01-14 ENCOUNTER — Encounter: Payer: Self-pay | Admitting: Family Medicine

## 2023-01-14 ENCOUNTER — Ambulatory Visit: Payer: Commercial Managed Care - PPO | Admitting: Family Medicine

## 2023-01-14 VITALS — BP 116/76 | HR 73 | Temp 97.6°F | Ht 67.5 in | Wt 192.2 lb

## 2023-01-14 DIAGNOSIS — H6122 Impacted cerumen, left ear: Secondary | ICD-10-CM

## 2023-01-14 NOTE — Assessment & Plan Note (Addendum)
Left ear cerumen impaction noted on exam.  Ear irrigation did clear out some cerumen though TM was still obscured.  I suspect most of her symptoms are from eustachian tube dysfunction.  Discussed I could not definitively tell her she does not have a ear infection though based on history it seems less likely.  She will continue Allegra.  She can start on Flonase 2 sprays each nostril once daily and see if that is beneficial as well.  She will be scheduled for nurse visit for ear irrigation.  She will start Debrox on her 3 days prior to that nurse visit.  She will let me know if she develops significant ear pain or fevers.

## 2023-01-14 NOTE — Progress Notes (Signed)
  Marikay Alar, MD Phone: [phone]  Maria Jennings is a 52 y.o. female who presents today for same-day visit.  Left ear ache: Patient notes this has been going on for 2.5 weeks.  Notes some aching in her left ear and into her eustachian tube area.  She notes doing a decongestant and Vicks VapoRub with minimal benefit.  Notes her ear pops a lot.  She notes no drainage.  No fever.  Symptoms improved for the 2 days she took the Allegra.  Symptoms worsened today though she did not take the Allegra.  Social History   Tobacco Use  Smoking Status Never  Smokeless Tobacco Never    Current Outpatient Medications on File Prior to Visit  Medication Sig Dispense Refill   Cholecalciferol (VITAMIN D3) 5000 units CAPS Take 1 capsule by mouth 2 (two) times a week.     clotrimazole (LOTRIMIN) 1 % cream Apply 1 Application topically 2 (two) times daily to affected corners of mouth until clear 28 g 1   cyanocobalamin 1000 MCG tablet Take 3,000 mcg by mouth 2 (two) times a week.      Dimethyl Fumarate 240 MG CPDR Take 1 capsule by mouth twice a day. 360 capsule 3   fexofenadine-pseudoephedrine (ALLEGRA-D 24) 180-240 MG 24 hr tablet Take by mouth as needed.     hydrocortisone 2.5 % cream Apply 1 Application topically 2 (two) times daily to affected areas on mouth angles, mixed with clotrimazole cream as directed. 30 g 1   ibuprofen (ADVIL) 600 MG tablet Take 1 tablet (600 mg total) by mouth every 6 (six) hours as needed. 30 tablet 0   No current facility-administered medications on file prior to visit.     ROS see history of present illness  Objective  Physical Exam Vitals:   01/14/23 0857  BP: 116/76  Pulse: 73  Temp: 97.6 F (36.4 C)  SpO2: 97%    BP Readings from Last 3 Encounters:  01/14/23 116/76  12/26/22 118/74  12/14/22 (!) 147/105   Wt Readings from Last 3 Encounters:  01/14/23 192 lb 3.2 oz (87.2 kg)  12/26/22 184 lb 6.4 oz (83.6 kg)  06/30/22 184 lb 3.2 oz (83.6 kg)     Physical Exam HENT:     Right Ear: Tympanic membrane and ear canal normal.     Ears:     Comments: Left TM obscured by cerumen     Assessment/Plan: Please see individual problem list.  Impacted cerumen of left ear Assessment & Plan: Left ear cerumen impaction noted on exam.  Ear irrigation did clear out some cerumen though TM was still obscured.  I suspect most of her symptoms are from eustachian tube dysfunction.  Discussed I could not definitively tell her she does not have a ear infection though based on history it seems less likely.  She will continue Allegra.  She can start on Flonase 2 sprays each nostril once daily and see if that is beneficial as well.  She will be scheduled for nurse visit for ear irrigation.  She will start Debrox on her 3 days prior to that nurse visit.  She will let me know if she develops significant ear pain or fevers.     Return in about 4 days (around 01/18/2023) for nurse visit ear irrigation.   Marikay Alar, MD [facility]

## 2023-01-16 ENCOUNTER — Telehealth: Payer: Self-pay | Admitting: Family Medicine

## 2023-01-16 NOTE — Telephone Encounter (Signed)
Patient need lab orders.

## 2023-01-16 NOTE — Telephone Encounter (Signed)
This appointment is supposed to be for a chest x-ray only. She does not need any labs.

## 2023-01-19 ENCOUNTER — Ambulatory Visit: Payer: Commercial Managed Care - PPO

## 2023-01-19 DIAGNOSIS — H6122 Impacted cerumen, left ear: Secondary | ICD-10-CM

## 2023-01-19 NOTE — Progress Notes (Signed)
Pt in office for left ear irrigation.  Order for Dr. Birdie Sons 01/14/23 visit.  Pt reports that she has been using debrox ear drops since her visit with Dr. Birdie Sons as instructed.  Ear irrigation without event, pt tolerated procedure well.  Dr. Birdie Sons in to assess left ear following irrigation.

## 2023-01-23 ENCOUNTER — Other Ambulatory Visit: Payer: Commercial Managed Care - PPO

## 2023-01-28 ENCOUNTER — Other Ambulatory Visit: Payer: Self-pay

## 2023-01-30 ENCOUNTER — Other Ambulatory Visit: Payer: Self-pay

## 2023-01-30 NOTE — Progress Notes (Signed)
Specialty Pharmacy Refill Coordination Note  Maria Jennings is a 52 y.o. female contacted today regarding refills of specialty medication(s) Dimethyl Fumarate (Multiple Sclerosis Agents)   Patient requested Delivery   Delivery date: 02/03/23   Verified address: [address]  [facility] [phone]   Medication will be filled on 02/02/23.

## 2023-02-02 ENCOUNTER — Other Ambulatory Visit: Payer: Self-pay

## 2023-02-02 ENCOUNTER — Ambulatory Visit: Payer: Commercial Managed Care - PPO

## 2023-02-02 ENCOUNTER — Other Ambulatory Visit: Payer: Commercial Managed Care - PPO

## 2023-02-02 DIAGNOSIS — J189 Pneumonia, unspecified organism: Secondary | ICD-10-CM

## 2023-02-11 ENCOUNTER — Encounter: Payer: Self-pay | Admitting: Family Medicine

## 2023-02-11 DIAGNOSIS — H938X9 Other specified disorders of ear, unspecified ear: Secondary | ICD-10-CM

## 2023-02-12 NOTE — Telephone Encounter (Signed)
Noted  

## 2023-02-19 ENCOUNTER — Other Ambulatory Visit: Payer: Self-pay

## 2023-02-19 ENCOUNTER — Other Ambulatory Visit (HOSPITAL_COMMUNITY): Payer: Self-pay

## 2023-02-19 ENCOUNTER — Other Ambulatory Visit: Payer: Self-pay | Admitting: Family

## 2023-02-19 ENCOUNTER — Other Ambulatory Visit (HOSPITAL_COMMUNITY): Payer: Self-pay | Admitting: Pharmacy Technician

## 2023-02-19 MED ORDER — METHYLPREDNISOLONE 4 MG PO TBPK
ORAL_TABLET | ORAL | 0 refills | Status: DC
  Filled 2023-02-19: qty 21, 6d supply, fill #0

## 2023-02-19 NOTE — Progress Notes (Signed)
Specialty Pharmacy Refill Coordination Note  Maria Jennings is a 52 y.o. female contacted today regarding refills of specialty medication(s) Dimethyl Fumarate (Multiple Sclerosis Agents)   Patient requested Delivery   Delivery date: 03/03/23   Verified address: [address]  [place]   Medication will be filled on 03/02/23.

## 2023-02-25 ENCOUNTER — Other Ambulatory Visit: Payer: Self-pay

## 2023-02-25 MED ORDER — DOXYCYCLINE HYCLATE 100 MG PO TABS
100.0000 mg | ORAL_TABLET | Freq: Two times a day (BID) | ORAL | 0 refills | Status: DC
  Filled 2023-02-25: qty 14, 7d supply, fill #0

## 2023-02-27 NOTE — Telephone Encounter (Signed)
noted 

## 2023-03-09 ENCOUNTER — Ambulatory Visit: Payer: Commercial Managed Care - PPO | Admitting: Family Medicine

## 2023-03-11 ENCOUNTER — Other Ambulatory Visit (HOSPITAL_COMMUNITY): Payer: Self-pay

## 2023-03-16 ENCOUNTER — Other Ambulatory Visit: Payer: Self-pay

## 2023-03-16 DIAGNOSIS — J329 Chronic sinusitis, unspecified: Secondary | ICD-10-CM | POA: Diagnosis not present

## 2023-03-16 DIAGNOSIS — H6983 Other specified disorders of Eustachian tube, bilateral: Secondary | ICD-10-CM | POA: Diagnosis not present

## 2023-03-16 DIAGNOSIS — H903 Sensorineural hearing loss, bilateral: Secondary | ICD-10-CM | POA: Diagnosis not present

## 2023-03-16 MED ORDER — OXYMETAZOLINE HCL 0.05 % NA SOLN: NASAL | 8 refills | Status: AC

## 2023-03-16 MED ORDER — PSEUDOEPHEDRINE HCL ER 120 MG PO TB12
120.0000 mg | ORAL_TABLET | Freq: Two times a day (BID) | ORAL | 5 refills | Status: AC | PRN
  Filled 2023-03-16: qty 20, 10d supply, fill #0

## 2023-03-17 ENCOUNTER — Ambulatory Visit: Payer: Commercial Managed Care - PPO | Admitting: Obstetrics and Gynecology

## 2023-03-17 DIAGNOSIS — J329 Chronic sinusitis, unspecified: Secondary | ICD-10-CM | POA: Diagnosis not present

## 2023-03-20 ENCOUNTER — Other Ambulatory Visit (HOSPITAL_COMMUNITY)
Admission: RE | Admit: 2023-03-20 | Discharge: 2023-03-20 | Disposition: A | Discharge status: Home / Self Care | Payer: Commercial Managed Care - PPO | Source: Ambulatory Visit | Attending: Obstetrics and Gynecology | Admitting: Obstetrics and Gynecology

## 2023-03-20 ENCOUNTER — Encounter: Payer: Self-pay | Admitting: Obstetrics and Gynecology

## 2023-03-20 ENCOUNTER — Other Ambulatory Visit: Payer: Self-pay

## 2023-03-20 ENCOUNTER — Ambulatory Visit (INDEPENDENT_AMBULATORY_CARE_PROVIDER_SITE_OTHER): Payer: Commercial Managed Care - PPO | Admitting: Obstetrics and Gynecology

## 2023-03-20 VITALS — BP 136/82 | HR 77 | Ht 68.5 in | Wt 194.0 lb

## 2023-03-20 DIAGNOSIS — N951 Menopausal and female climacteric states: Secondary | ICD-10-CM | POA: Diagnosis not present

## 2023-03-20 DIAGNOSIS — Z1211 Encounter for screening for malignant neoplasm of colon: Secondary | ICD-10-CM

## 2023-03-20 DIAGNOSIS — Z01419 Encounter for gynecological examination (general) (routine) without abnormal findings: Secondary | ICD-10-CM

## 2023-03-20 DIAGNOSIS — E2839 Other primary ovarian failure: Secondary | ICD-10-CM

## 2023-03-20 MED ORDER — HYDROCORTISONE ACETATE 25 MG RE SUPP
25.0000 mg | Freq: Two times a day (BID) | RECTAL | 0 refills | Status: DC
  Filled 2023-03-20: qty 12, 6d supply, fill #0

## 2023-03-20 NOTE — Progress Notes (Signed)
52 y.o. y.o. female here for annual exam. No LMP recorded. (Menstrual status: Irregular Periods).    [id number] Married White or Caucasian Not Hispanic or Latino female here for annual exam.    She is having tolerable vasomotor symptoms. Would like to avoid HRT use. Sexually active, no pain or dryness.   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 2 children.  One is Tour manager at [employer].  Other lives in [place].  No LMP recorded.          Sexually active: Yes.    The current method of family planning is none.    Exercising: Yes.    Hike, bike, paddle board, tennis  Smoker:  no  Health Maintenance: Pap:   7/24/20wnl Hr HPV Neg.  2017 Neg Hr HPV neg  History of abnormal Pap:  no MMG:  07/18/22  Bi-rads 1 neg  BMD:   n/a baseline ordered with h/o MS and steroid use Colonoscopy: none Patient states she will do next year TDaP:  UTD per patient  Gardasil: n/a Body mass index is 29.07 kg/m.     03/20/2023   10:04 AM 01/14/2023    9:05 AM 12/26/2022   11:35 AM  Depression screen PHQ 2/9  Decreased Interest 0 0 0  Down, Depressed, Hopeless 0 0 0  PHQ - 2 Score 0 0 0  Altered sleeping  0 0  Tired, decreased energy  0 0  Change in appetite  0 0  Feeling bad or failure about yourself   0 0  Trouble concentrating  0 0  Moving slowly or fidgety/restless  0 0  Suicidal thoughts  0 0  PHQ-9 Score  0 0  Difficult doing work/chores  Not difficult at all Not difficult at all    Blood pressure 136/82, pulse 77, height 5' 8.5" (1.74 m), weight 194 lb (88 kg), SpO2 97%.     Component Value Date/Time   DIAGPAP  11/05/2018 0000    NEGATIVE FOR INTRAEPITHELIAL LESIONS OR MALIGNANCY.   DIAGPAP  11/05/2018 0000    FUNGAL ORGANISMS PRESENT CONSISTENT WITH CANDIDA SPP.   ADEQPAP  11/05/2018 0000    Satisfactory for evaluation  endocervical/transformation zone component PRESENT.    GYN HISTORY:    Component  Value Date/Time   DIAGPAP  11/05/2018 0000    NEGATIVE FOR INTRAEPITHELIAL LESIONS OR MALIGNANCY.   DIAGPAP  11/05/2018 0000    FUNGAL ORGANISMS PRESENT CONSISTENT WITH CANDIDA SPP.   ADEQPAP  11/05/2018 0000    Satisfactory for evaluation  endocervical/transformation zone component PRESENT.    OB History  Gravida Para Term Preterm AB Living  2 2 2  0 0 2  SAB IAB Ectopic Multiple Live Births  0 0 0 0 2    # Outcome Date GA Lbr Len/2nd Weight Sex Type Anes PTL Lv  2 Term 03/2004 [redacted]w[redacted]d  6 lb (2.722 kg) F CS-Unspec   LIV  1 Term 02/2002 [redacted]w[redacted]d  6 lb (2.722 kg) F Vag-Spont   LIV    Past Medical History:  Diagnosis Date   Lumbago    Multiple sclerosis (HCC)    symptom free 10 years   Sinusitis    Vitamin D deficiency     Past Surgical History:  Procedure Laterality Date   CESAREAN SECTION     x 1   KNEE ARTHROSCOPY Right 07/2013    Current Outpatient Medications on File Prior to  Visit  Medication Sig Dispense Refill   Cholecalciferol (VITAMIN D3) 5000 units CAPS Take 1 capsule by mouth 2 (two) times a week.     clotrimazole (LOTRIMIN) 1 % cream Apply 1 Application topically 2 (two) times daily to affected corners of mouth until clear 28 g 1   cyanocobalamin 1000 MCG tablet Take 3,000 mcg by mouth 2 (two) times a week.      Dimethyl Fumarate 240 MG CPDR Take 1 capsule by mouth twice a day. 360 capsule 3   doxycycline (VIBRA-TABS) 100 MG tablet Take 1 tablet (100 mg total) by mouth 2 (two) times daily. 14 tablet 0   fexofenadine-pseudoephedrine (ALLEGRA-D 24) 180-240 MG 24 hr tablet Take by mouth as needed.     hydrocortisone 2.5 % cream Apply 1 Application topically 2 (two) times daily to affected areas on mouth angles, mixed with clotrimazole cream as directed. 30 g 1   ibuprofen (ADVIL) 600 MG tablet Take 1 tablet (600 mg total) by mouth every 6 (six) hours as needed. 30 tablet 0   oxymetazoline (AFRIN 12 HOUR) 0.05 % nasal spray 6 puffs each nostril prior to flying or driving  in mountains 30 mL 8   pseudoephedrine (SUDAFED) 120 MG 12 hr tablet Take 1 tablet (120 mg total) by mouth 2 (two) times daily as needed. take prior to flying or diving 30 tablet 5   No current facility-administered medications on file prior to visit.    Social History   Socioeconomic History   Marital status: Married    Spouse name: Not on file   Number of children: 2   Years of education: Not on file   Highest education level: Bachelor's degree (e.g., BA, AB, BS)  Occupational History    Employer: [employer]  Tobacco Use   Smoking status: Never   Smokeless tobacco: Never  Substance and Sexual Activity   Alcohol use: Yes    Alcohol/week: 5.0 standard drinks of alcohol    Types: 5 Glasses of wine per week   Drug use: No   Sexual activity: Yes    Partners: Male    Birth control/protection: None  Other Topics Concern   Not on file  Social History Narrative   Lives in [place].      Work - Immunologist      Married       From [place]   Social Determinants of Health   Financial Resource Strain: Low Risk  (12/25/2022)   Overall Financial Resource Strain (CARDIA)    Difficulty of Paying Living Expenses: Not very hard  Food Insecurity: No Food Insecurity (12/25/2022)   Hunger Vital Sign    Worried About Running Out of Food in the Last Year: Never true    Ran Out of Food in the Last Year: Never true  Transportation Needs: No Transportation Needs (12/25/2022)   PRAPARE - Administrator, Civil Service (Medical): No    Lack of Transportation (Non-Medical): No  Physical Activity: Sufficiently Active (12/25/2022)   Exercise Vital Sign    Days of Exercise per Week: 7 days    Minutes of Exercise per Session: 40 min  Stress: No Stress Concern Present (12/25/2022)   [employer] of Occupational Health - Occupational Stress Questionnaire    Feeling of Stress : Not at all  Social Connections: Socially Integrated (12/25/2022)   Social Connection and Isolation Panel  [NHANES]    Frequency of Communication with Friends and Family: More than three times a week  Frequency of Social Gatherings with Friends and Family: Once a week    Attends Religious Services: More than 4 times per year    Active Member of [employer] or Organizations: Yes    Attends Engineer, structural: More than 4 times per year    Marital Status: Married  Catering manager Violence: Not on file    Family History  Problem Relation Age of Onset   Hypertension Mother    Hyperlipidemia Mother    Hypertension Father    Cancer Maternal Grandmother 14       colon   Cancer Cousin 52       colon   Breast cancer Neg Hx      Allergies  Allergen Reactions   Amoxicillin Other (See Comments)   Azithromycin Hives   Ciprofloxacin Hcl Rash      Patient's last menstrual period was No LMP recorded. (Menstrual status: Irregular Periods)..             Review of Systems Alls systems reviewed and are negative.     OBGyn Exam    A:         Well Woman GYN exam                             P:        Pap smear collected today Encouraged annual mammogram screening Colon cancer screening referral placed today DXA ordered today Labs and immunizations to do with PMD Encouraged healthy lifestyle practices Encouraged Vit D and Calcium and magnesium  No follow-ups on file.  Earley Favor

## 2023-03-23 ENCOUNTER — Other Ambulatory Visit: Payer: Self-pay

## 2023-03-23 LAB — CYTOLOGY - PAP
Comment: NEGATIVE
Diagnosis: NEGATIVE
High risk HPV: NEGATIVE

## 2023-03-27 ENCOUNTER — Other Ambulatory Visit (HOSPITAL_COMMUNITY): Payer: Self-pay

## 2023-03-27 ENCOUNTER — Other Ambulatory Visit (HOSPITAL_COMMUNITY): Payer: Self-pay | Admitting: Pharmacy Technician

## 2023-03-27 MED ORDER — CEFPODOXIME PROXETIL 200 MG PO TABS: 200.0000 mg | ORAL_TABLET | Freq: Two times a day (BID) | ORAL | 0 refills | Status: DC

## 2023-03-27 NOTE — Progress Notes (Signed)
Specialty Pharmacy Refill Coordination Note  Maria Jennings is a 52 y.o. female contacted today regarding refills of specialty medication(s) Dimethyl Fumarate   Patient requested Delivery   Delivery date: 04/06/23   Verified address: [address] [place], [place]   Medication will be filled on 04/03/23.

## 2023-03-28 ENCOUNTER — Other Ambulatory Visit: Payer: Self-pay | Admitting: Family Medicine

## 2023-03-30 NOTE — Telephone Encounter (Signed)
I believe the patient has gone out of the country. Can you reach out to her to see if she picked this up before she left the country? Thanks.

## 2023-03-30 NOTE — Telephone Encounter (Signed)
Medication not covered by insurance

## 2023-04-03 ENCOUNTER — Other Ambulatory Visit: Payer: Self-pay

## 2023-04-03 NOTE — Telephone Encounter (Signed)
 Attempted to call Patient- no answer and voicemail is full

## 2023-04-27 ENCOUNTER — Other Ambulatory Visit: Payer: Self-pay

## 2023-04-27 ENCOUNTER — Encounter (HOSPITAL_COMMUNITY): Payer: Self-pay

## 2023-04-27 NOTE — Progress Notes (Signed)
 Specialty Pharmacy Ongoing Clinical Assessment Note  Maria Jennings is a 53 y.o. female who is being followed by the specialty pharmacy service for RxSp Multiple Sclerosis   Patient's specialty medication(s) reviewed today: Dimethyl Fumarate    Missed doses in the last 4 weeks: 0   Patient/Caregiver did not have any additional questions or concerns.   Therapeutic benefit summary: Patient is achieving benefit   Adverse events/side effects summary: No adverse events/side effects   Patient's therapy is appropriate to: Continue    Goals Addressed             This Visit's Progress    Stabilization of disease       Patient is on track. Patient will maintain adherence         Follow up:  6 months  Mitzie GORMAN [facility] Specialty Pharmacist

## 2023-04-27 NOTE — Progress Notes (Signed)
 Specialty Pharmacy Refill Coordination Note  Maria Jennings is a 53 y.o. female contacted today regarding refills of specialty medication(s) Dimethyl Fumarate    Patient requested Delivery   Delivery date: 05/07/23   Verified address: [address]   Medication will be filled on 05/06/23.

## 2023-05-06 ENCOUNTER — Other Ambulatory Visit: Payer: Self-pay

## 2023-05-06 ENCOUNTER — Telehealth: Payer: Self-pay | Admitting: Pharmacist

## 2023-05-06 NOTE — Telephone Encounter (Signed)
Called patient to schedule an appointment for the [employer] [facility]. I was unable to reach the patient so I left a HIPAA-compliant message requesting that the patient return my call.   Luke Van Ausdall, PharmD, BCACP, CPP Clinical Pharmacist [facility] [phone]  

## 2023-05-06 NOTE — Progress Notes (Signed)
Pharmacy Patient Advocate Encounter  Received notification from [facility] that Prior Authorization for Dimethyl has been APPROVED from 05/06/23 to 05/05/24   PA #/Case ID/Reference #: [id number]

## 2023-05-06 NOTE — Progress Notes (Signed)
Pharmacy Patient Advocate Encounter   Received notification from Patient Pharmacy that prior authorization for Dimethyl is required/requested.   Insurance verification completed.   The patient is insured through [facility] .   Per test claim: PA required; PA submitted to above mentioned insurance via CoverMyMeds Key/confirmation #/EOC [id number] Status is pending

## 2023-05-22 ENCOUNTER — Other Ambulatory Visit (HOSPITAL_COMMUNITY): Payer: Self-pay

## 2023-06-01 ENCOUNTER — Other Ambulatory Visit (HOSPITAL_COMMUNITY): Payer: Self-pay

## 2023-06-01 ENCOUNTER — Other Ambulatory Visit: Payer: Self-pay

## 2023-06-01 NOTE — Progress Notes (Signed)
Specialty Pharmacy Refill Coordination Note  Maria Jennings is a 53 y.o. female contacted today regarding refills of specialty medication(s) Dimethyl Fumarate   Patient requested Delivery   Delivery date: 06/09/23   Verified address: [address]   [facility] [address]   Medication will be filled on 06/08/23.

## 2023-06-08 ENCOUNTER — Other Ambulatory Visit: Payer: Self-pay

## 2023-06-29 ENCOUNTER — Other Ambulatory Visit: Payer: Self-pay

## 2023-07-01 ENCOUNTER — Other Ambulatory Visit: Payer: Self-pay

## 2023-07-01 NOTE — Progress Notes (Signed)
 Specialty Pharmacy Refill Coordination Note  Maria Jennings is a 53 y.o. female contacted today regarding refills of specialty medication(s) Dimethyl Fumarate   Patient requested (Patient-Rptd) Delivery   Delivery date: (Patient-Rptd) 07/09/23   Verified address: (Patient-Rptd) [address], [place], [place], [address]   Medication will be filled on 03.26.25.

## 2023-07-03 ENCOUNTER — Other Ambulatory Visit (HOSPITAL_COMMUNITY): Payer: Self-pay

## 2023-07-06 ENCOUNTER — Encounter: Payer: Commercial Managed Care - PPO | Admitting: Family Medicine

## 2023-07-07 ENCOUNTER — Ambulatory Visit: Payer: Commercial Managed Care - PPO | Admitting: Nurse Practitioner

## 2023-07-07 ENCOUNTER — Other Ambulatory Visit: Payer: Self-pay

## 2023-07-07 VITALS — BP 120/64 | HR 78 | Temp 98.3°F | Ht 68.5 in | Wt 195.8 lb

## 2023-07-07 DIAGNOSIS — E559 Vitamin D deficiency, unspecified: Secondary | ICD-10-CM | POA: Diagnosis not present

## 2023-07-07 DIAGNOSIS — Z13220 Encounter for screening for lipoid disorders: Secondary | ICD-10-CM | POA: Diagnosis not present

## 2023-07-07 DIAGNOSIS — N951 Menopausal and female climacteric states: Secondary | ICD-10-CM

## 2023-07-07 DIAGNOSIS — G35 Multiple sclerosis: Secondary | ICD-10-CM | POA: Diagnosis not present

## 2023-07-07 DIAGNOSIS — Z1329 Encounter for screening for other suspected endocrine disorder: Secondary | ICD-10-CM | POA: Diagnosis not present

## 2023-07-07 DIAGNOSIS — H938X3 Other specified disorders of ear, bilateral: Secondary | ICD-10-CM | POA: Diagnosis not present

## 2023-07-07 DIAGNOSIS — Z0000 Encounter for general adult medical examination without abnormal findings: Secondary | ICD-10-CM

## 2023-07-07 DIAGNOSIS — Z0001 Encounter for general adult medical examination with abnormal findings: Secondary | ICD-10-CM

## 2023-07-07 DIAGNOSIS — Z1211 Encounter for screening for malignant neoplasm of colon: Secondary | ICD-10-CM

## 2023-07-07 DIAGNOSIS — Z1231 Encounter for screening mammogram for malignant neoplasm of breast: Secondary | ICD-10-CM

## 2023-07-07 MED ORDER — FLUTICASONE PROPIONATE 50 MCG/ACT NA SUSP
2.0000 | Freq: Every day | NASAL | 5 refills | Status: AC
  Filled 2023-07-07: qty 16, 30d supply, fill #0

## 2023-07-07 NOTE — Patient Instructions (Signed)
 YOUR MAMMOGRAM IS DUE, PLEASE CALL AND GET THIS SCHEDULED! [facility] - call [phone]

## 2023-07-07 NOTE — Progress Notes (Unsigned)
 Bethanie Dicker, NP-C Phone: [phone]  Maria Jennings is a 53 y.o. female who presents today for transfer of care and annual exam.   Discussed the use of AI scribe software for clinical note transcription with the patient, who gave verbal consent to proceed.  History of Present Illness   Maria Jennings is a 53 year old female with multiple sclerosis who presents for transfer of care and annual exam.  She has a history of multiple sclerosis and has been symptom-free for the past ten years. She is currently on an immunosuppressant medication and takes vitamin D and B supplements. She undergoes blood work twice a year to monitor her condition. No new symptoms have been reported. She is followed by Neurology who extended her follow ups to every 2 years.   She is experiencing menopause and has started taking magnesium to help with sleep and anxiety. She experiences hot flashes but prefers not to take hormone replacement therapy. She has increased her protein intake since menopause, which she believes has contributed to muscle gain. No chest pain, shortness of breath, abdominal pain, constipation, diarrhea, burning during urination, abnormal discharge, painful intercourse, headaches, dizziness, trouble swallowing, joint pain, or skin changes. Reports hot flashes.  She has a history of sinus infections, with the last significant episode occurring last year, which she attributes to her daughter's illness. She is concerned about sinus infections triggering her MS, as it happened ten years ago. She reports persistent ear congestion, especially when lying down, despite previous treatments including Flonase and other medications. An MRI before a trip to [place] showed no abnormalities.  She does not smoke or use recreational drugs, drinks alcohol about three times a week, and maintains an active lifestyle with activities such as Pilates, hiking, golf, and tennis. 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      Social History   Tobacco Use  Smoking Status Never  Smokeless Tobacco Never    Current Outpatient Medications on File Prior to Visit  Medication Sig Dispense Refill   Cholecalciferol (VITAMIN D3) 5000 units CAPS Take 1 capsule by mouth 2 (two) times a week.     cyanocobalamin 1000 MCG tablet Take 3,000 mcg by mouth 2 (two) times a week.      Dimethyl Fumarate 240 MG CPDR Take 1 capsule by mouth twice a day. 360 capsule 3   fexofenadine-pseudoephedrine (ALLEGRA-D 24) 180-240 MG 24 hr tablet Take by mouth as needed.     hydrocortisone 2.5 % cream Apply 1 Application topically 2 (two) times daily to affected areas on mouth angles, mixed with clotrimazole cream as directed. 30 g 1   ibuprofen (ADVIL) 600 MG tablet Take 1 tablet (600 mg total) by mouth every 6 (six) hours as needed. 30 tablet 0   oxymetazoline (AFRIN 12 HOUR) 0.05 % nasal spray 6 puffs each nostril prior to flying or driving in mountains 30 mL 8   pseudoephedrine (SUDAFED) 120 MG 12 hr tablet Take 1 tablet (120 mg total) by mouth 2 (two) times daily as needed. take prior to flying or diving 30 tablet 5   No current facility-administered medications on file prior to visit.    ROS see history of present illness  Objective  Physical Exam Vitals:   07/07/23 1517  BP: 120/64  Pulse: 78  Temp: 98.3 F (36.8 C)  SpO2: 98%    BP Readings from Last 3 Encounters:  07/07/23 120/64  03/20/23 136/82  01/14/23 116/76   Wt Readings from Last 3 Encounters:  07/07/23 195 lb 12.8 oz (88.8 kg)  03/20/23 194 lb (88 kg)  01/14/23 192 lb 3.2 oz (87.2 kg)    Physical Exam Constitutional:      General: She is not in acute distress.    Appearance: Normal appearance.  HENT:     Head: Normocephalic.     Right Ear: A middle ear effusion (mucoid fluid) is present.     Left Ear: A middle ear effusion (mucoid fluid) is present.     Nose: Nose normal.     Mouth/Throat:      Mouth: Mucous membranes are moist.     Pharynx: Oropharynx is clear.  Eyes:     Conjunctiva/sclera: Conjunctivae normal.     Pupils: Pupils are equal, round, and reactive to light.  Neck:     Thyroid: No thyromegaly.  Cardiovascular:     Rate and Rhythm: Normal rate and regular rhythm.     Heart sounds: Normal heart sounds.  Pulmonary:     Effort: Pulmonary effort is normal.     Breath sounds: Normal breath sounds.  Abdominal:     General: Abdomen is flat. Bowel sounds are normal.     Palpations: Abdomen is soft. There is no mass.     Tenderness: There is no abdominal tenderness.  Musculoskeletal:        General: Normal range of motion.  Lymphadenopathy:     Cervical: No cervical adenopathy.  Skin:    General: Skin is warm and dry.     Findings: No rash.  Neurological:     General: No focal deficit present.     Mental Status: She is alert.  Psychiatric:        Mood and Affect: Mood normal.        Behavior: Behavior normal.     Assessment/Plan: Please see individual problem list.  Encounter for general adult medical examination with abnormal findings Assessment & Plan: She is generally healthy with a good diet and regular exercise. She is up to date with gynecological exams but is due for a mammogram in April and colonoscopy. She politely declined flu, tetanus and shingles vaccines. She has received 3 COVID vaccines and declines additional. Order a mammogram and plan for a colonoscopy within the year, referral placed to GI. Continue routine dental and eye exams. Continue healthy diet and regular exercise. Return to care in one year, sooner as needed.    Menopausal symptoms Assessment & Plan: She experiences hot flashes and sleep disturbances but has opted against hormone replacement therapy due risks. She manages symptoms with magnesium, reduced red wine, and increased sparkling water. Continue magnesium supplementation as tolerated and adjust lifestyle modifications  based on symptom monitoring.   Congestion of both ears Assessment & Plan: She experiences chronic sinus congestion during allergy season with pressure and yellow fluid behind her ears and prefers minimal medication use. Recommend Flonase nasal spray twice daily during allergy season. Consider antihistamines like Zyrtec or Claritin for allergy symptoms. Use Sudafed as needed for congestion. Return precautions given to patient.   Orders: -     Fluticasone Propionate; Place 2 sprays into both nostrils daily.  Dispense: 16 g; Refill: 5  Multiple sclerosis (HCC) Assessment & Plan: She has been symptom-free for ten years but is concerned about infections triggering MS symptoms. She is on MS medication and vitamin D and B supplementation. Check lab work as outlined including white blood cell count for MS  medication monitoring. Follow up with Neurology as scheduled.   Orders: -     CBC with Differential/Platelet -     Comprehensive metabolic panel -     Vitamin B12  Vitamin D deficiency -     VITAMIN D 25 Hydroxy (Vit-D Deficiency, Fractures)  Thyroid disorder screen -     TSH  Lipid screening -     Lipid panel  Screen for colon cancer -     Ambulatory referral to Gastroenterology  Screening mammogram for breast cancer -     3D Screening Mammogram, Left and Right; Future    Return in about 1 year (around 07/06/2024) for Annual Exam, sooner as needed.   Bethanie Dicker, NP-C [facility]

## 2023-07-08 ENCOUNTER — Other Ambulatory Visit: Payer: Self-pay

## 2023-07-08 ENCOUNTER — Encounter: Payer: Self-pay | Admitting: Nurse Practitioner

## 2023-07-08 DIAGNOSIS — N951 Menopausal and female climacteric states: Secondary | ICD-10-CM | POA: Insufficient documentation

## 2023-07-08 LAB — CBC WITH DIFFERENTIAL/PLATELET
Basophils Absolute: 0.1 10*3/uL (ref 0.0–0.1)
Basophils Relative: 1 % (ref 0.0–3.0)
Eosinophils Absolute: 0.3 10*3/uL (ref 0.0–0.7)
Eosinophils Relative: 4.3 % (ref 0.0–5.0)
HCT: 45 % (ref 36.0–46.0)
Hemoglobin: 15.5 g/dL — ABNORMAL HIGH (ref 12.0–15.0)
Lymphocytes Relative: 23.4 % (ref 12.0–46.0)
Lymphs Abs: 1.5 10*3/uL (ref 0.7–4.0)
MCHC: 34.5 g/dL (ref 30.0–36.0)
MCV: 95.6 fl (ref 78.0–100.0)
Monocytes Absolute: 0.4 10*3/uL (ref 0.1–1.0)
Monocytes Relative: 6.4 % (ref 3.0–12.0)
Neutro Abs: 4.1 10*3/uL (ref 1.4–7.7)
Neutrophils Relative %: 64.9 % (ref 43.0–77.0)
Platelets: 242 10*3/uL (ref 150.0–400.0)
RBC: 4.71 Mil/uL (ref 3.87–5.11)
RDW: 12.6 % (ref 11.5–15.5)
WBC: 6.3 10*3/uL (ref 4.0–10.5)

## 2023-07-08 LAB — COMPREHENSIVE METABOLIC PANEL WITH GFR
ALT: 13 U/L (ref 0–35)
AST: 15 U/L (ref 0–37)
Albumin: 4.7 g/dL (ref 3.5–5.2)
Alkaline Phosphatase: 80 U/L (ref 39–117)
BUN: 16 mg/dL (ref 6–23)
CO2: 26 meq/L (ref 19–32)
Calcium: 9.8 mg/dL (ref 8.4–10.5)
Chloride: 101 meq/L (ref 96–112)
Creatinine, Ser: 0.61 mg/dL (ref 0.40–1.20)
GFR: 100.85 mL/min (ref >60.00)
Glucose, Bld: 83 mg/dL (ref 70–99)
Potassium: 4.2 meq/L (ref 3.5–5.1)
Sodium: 138 meq/L (ref 135–145)
Total Bilirubin: 0.6 mg/dL (ref 0.2–1.2)
Total Protein: 7.4 g/dL (ref 6.0–8.3)

## 2023-07-08 LAB — LIPID PANEL
Cholesterol: 204 mg/dL — ABNORMAL HIGH (ref 0–200)
HDL: 64.4 mg/dL (ref >39.00)
LDL Cholesterol: 115 mg/dL — ABNORMAL HIGH (ref 0–99)
NonHDL: 139.54
Total CHOL/HDL Ratio: 3
Triglycerides: 123 mg/dL (ref 0.0–149.0)
VLDL: 24.6 mg/dL (ref 0.0–40.0)

## 2023-07-08 NOTE — Assessment & Plan Note (Signed)
 She experiences chronic sinus congestion during allergy season with pressure and yellow fluid behind her ears and prefers minimal medication use. Recommend Flonase nasal spray twice daily during allergy season. Consider antihistamines like Zyrtec or Claritin for allergy symptoms. Use Sudafed as needed for congestion. Return precautions given to patient.

## 2023-07-08 NOTE — Assessment & Plan Note (Deleted)
 She is generally healthy with a good diet and regular exercise. She is up to date with gynecological exams but is due for a mammogram in April and colonoscopy. She politely declined flu, tetanus and shingles vaccines. She has received 3 COVID vaccines and declines additional. Order a mammogram and plan for a colonoscopy within the year, referral placed to GI. Continue routine dental and eye exams. Continue healthy diet and regular exercise. Return to care in one year, sooner as needed.

## 2023-07-08 NOTE — Assessment & Plan Note (Signed)
 She has been symptom-free for ten years but is concerned about infections triggering MS symptoms. She is on MS medication and vitamin D and B supplementation. Check lab work as outlined including white blood cell count for MS medication monitoring. Follow up with Neurology as scheduled.

## 2023-07-08 NOTE — Assessment & Plan Note (Signed)
 She is generally healthy with a good diet and regular exercise. She is up to date with gynecological exams but is due for a mammogram in April and colonoscopy. She politely declined flu, tetanus and shingles vaccines. She has received 3 COVID vaccines and declines additional. Order a mammogram and plan for a colonoscopy within the year, referral placed to GI. Continue routine dental and eye exams. Continue healthy diet and regular exercise. Return to care in one year, sooner as needed.

## 2023-07-08 NOTE — Assessment & Plan Note (Signed)
 She experiences hot flashes and sleep disturbances but has opted against hormone replacement therapy due risks. She manages symptoms with magnesium, reduced red wine, and increased sparkling water. Continue magnesium supplementation as tolerated and adjust lifestyle modifications based on symptom monitoring.

## 2023-07-09 LAB — VITAMIN D 25 HYDROXY (VIT D DEFICIENCY, FRACTURES): VITD: 46.47 ng/mL (ref 30.00–100.00)

## 2023-07-09 LAB — TSH: TSH: 1.45 u[IU]/mL (ref 0.35–5.50)

## 2023-07-09 LAB — VITAMIN B12: Vitamin B-12: 720 pg/mL (ref 211–911)

## 2023-07-10 ENCOUNTER — Encounter: Payer: Self-pay | Admitting: Nurse Practitioner

## 2023-07-20 ENCOUNTER — Other Ambulatory Visit: Payer: Self-pay

## 2023-07-21 ENCOUNTER — Encounter: Payer: Self-pay | Admitting: *Deleted

## 2023-08-06 ENCOUNTER — Other Ambulatory Visit: Payer: Self-pay

## 2023-08-10 ENCOUNTER — Other Ambulatory Visit (HOSPITAL_COMMUNITY): Payer: Self-pay

## 2023-08-10 ENCOUNTER — Other Ambulatory Visit: Payer: Self-pay

## 2023-08-10 NOTE — Progress Notes (Signed)
 Specialty Pharmacy Refill Coordination Note  Maria Jennings is a 53 y.o. female contacted today regarding refills of specialty medication(s) Dimethyl Fumarate    Patient requested Delivery   Delivery date: 08/12/23   Verified address: [address]   [facility] [address]   Medication will be filled on 08/11/23.

## 2023-08-11 ENCOUNTER — Other Ambulatory Visit: Payer: Self-pay

## 2023-08-19 ENCOUNTER — Encounter (HOSPITAL_COMMUNITY): Payer: Self-pay

## 2023-08-26 ENCOUNTER — Ambulatory Visit
Admission: RE | Admit: 2023-08-26 | Discharge: 2023-08-26 | Disposition: A | Discharge status: Home / Self Care | Source: Ambulatory Visit | Attending: Nurse Practitioner | Admitting: Nurse Practitioner

## 2023-08-26 DIAGNOSIS — Z1231 Encounter for screening mammogram for malignant neoplasm of breast: Secondary | ICD-10-CM | POA: Diagnosis not present

## 2023-08-28 ENCOUNTER — Other Ambulatory Visit: Payer: Self-pay

## 2023-09-01 ENCOUNTER — Other Ambulatory Visit: Payer: Self-pay | Admitting: Pharmacy Technician

## 2023-09-01 ENCOUNTER — Other Ambulatory Visit (HOSPITAL_COMMUNITY): Payer: Self-pay

## 2023-09-01 ENCOUNTER — Other Ambulatory Visit: Payer: Self-pay

## 2023-09-01 NOTE — Progress Notes (Signed)
 Specialty Pharmacy Refill Coordination Note  Maria Jennings is a 53 y.o. female contacted today regarding refills of specialty medication(s) Dimethyl Fumarate    Patient requested Delivery   Delivery date: 09/04/23   Verified address: [address] [place], [place] [address]   Medication will be filled on 09/02/21.

## 2023-09-03 ENCOUNTER — Other Ambulatory Visit: Payer: Self-pay

## 2023-09-03 NOTE — Progress Notes (Signed)
 Patient was left a voice mail, prescription has expired. A refill request has been sent to their provider and patient was notified on voice mail that due to this there could be possible delays.

## 2023-09-03 NOTE — Progress Notes (Signed)
 Patient was left a voice mail that refill request was denied and pt should contacted provider and schedule an appointment

## 2023-09-04 ENCOUNTER — Other Ambulatory Visit: Payer: Self-pay

## 2023-09-04 ENCOUNTER — Ambulatory Visit: Attending: Nurse Practitioner | Admitting: Pharmacist

## 2023-09-04 ENCOUNTER — Other Ambulatory Visit (HOSPITAL_COMMUNITY): Payer: Self-pay

## 2023-09-04 DIAGNOSIS — Z79899 Other long term (current) drug therapy: Secondary | ICD-10-CM

## 2023-09-04 MED ORDER — DIMETHYL FUMARATE 240 MG PO CPDR
DELAYED_RELEASE_CAPSULE | Freq: Two times a day (BID) | ORAL | 3 refills | Status: DC
  Filled 2023-09-04: qty 60, 30d supply, fill #0

## 2023-09-04 MED ORDER — DIMETHYL FUMARATE 240 MG PO CPDR
DELAYED_RELEASE_CAPSULE | Freq: Two times a day (BID) | ORAL | 3 refills | Status: DC
  Filled 2023-09-04: qty 60, 30d supply, fill #0
  Filled 2023-10-05: qty 60, 30d supply, fill #1
  Filled 2023-10-30: qty 60, 30d supply, fill #2
  Filled 2023-11-26: qty 60, 30d supply, fill #3
  Filled 2023-12-22 – 2023-12-25 (×2): qty 60, 30d supply, fill #4
  Filled 2024-01-26 – 2024-01-29 (×2): qty 60, 30d supply, fill #5
  Filled 2024-02-24 – 2024-03-03 (×2): qty 60, 30d supply, fill #6

## 2023-09-04 NOTE — Progress Notes (Signed)
 S: Patient presents today for review of their specialty medication.    Patient is currently taking Tecfidera  (dimethyl fumarate ) for MS. Patient is managed by Dr. Mason Sole for this.    Adherence: confirms   Efficacy: pt is happy with his results   Dosing: 240 mg BID   Renal adjustment: no adjustment necessary   Hepatic adjustment: no adjustment necessary   Dose adjustment for toxicity:  Flushing, GI intolerance, or intolerance to maintenance dose: Consider temporary dose reduction to 120 mg twice daily (resume recommended maintenance dose of 240 mg twice daily within 4 weeks). Consider discontinuation in patients who cannot tolerate return to the maintenance dose.   Hepatic injury (suspected drug-induced), clinically significant: Discontinue treatment.   Lymphocyte count <500/mm3 persisting for >6 months: Consider treatment interruption.   Serious infection: Consider withholding treatment until infection resolves.   Current adverse effects: Flushing, skin rash, or puritus: none S/sx of infection: none  GI upset: none  S/sx of heptotoxicity: none  O: Labs followed by patient's PCP. WNL per pt.      Latest Ref Rng & Units 07/07/2023    3:58 PM 12/14/2022   10:26 AM 07/21/2022    3:33 PM  BMP  Glucose 70 - 99 mg/dL 83  956  80   BUN 6 - 23 mg/dL 16  10  13    Creatinine 0.40 - 1.20 mg/dL 2.13  0.86  5.78   Sodium 135 - 145 mEq/L 138  137  138   Potassium 3.5 - 5.1 mEq/L 4.2  4.0  4.3   Chloride 96 - 112 mEq/L 101  101  101   CO2 19 - 32 mEq/L 26  23  26    Calcium 8.4 - 10.5 mg/dL 9.8  9.1  9.9    CBC    Component Value Date/Time   WBC 6.3 07/07/2023 1558   RBC 4.71 07/07/2023 1558   HGB 15.5 (H) 07/07/2023 1558   HGB 14.6 02/07/2020 0915   HCT 45.0 07/07/2023 1558   HCT 43.3 02/07/2020 0915   PLT 242.0 07/07/2023 1558   PLT 221 02/07/2020 0915   MCV 95.6 07/07/2023 1558   MCV 91 02/07/2020 0915   MCV 90 12/15/2013 0752   MCH 32.8 12/14/2022 1026   MCHC 34.5  07/07/2023 1558   RDW 12.6 07/07/2023 1558   RDW 14.0 02/07/2020 0915   RDW 15.8 (H) 12/15/2013 0752   LYMPHSABS 1.5 07/07/2023 1558   LYMPHSABS 1.3 02/07/2020 0915   LYMPHSABS 1.2 12/15/2013 0752   MONOABS 0.4 07/07/2023 1558   MONOABS 0.5 12/15/2013 0752   EOSABS 0.3 07/07/2023 1558   EOSABS 0.3 02/07/2020 0915   EOSABS 0.2 12/15/2013 0752   BASOSABS 0.1 07/07/2023 1558   BASOSABS 0.1 02/07/2020 0915   BASOSABS 0.0 12/15/2013 0752    A/P: 1. Medication review: Patient is currently on Tecfidera  for MS and is tolerating it well. Reviewed the medication with the patient, including the following: dimethyl fumarate  activates the Nrf2 pathway, which is believed to result in anti-inflammatory and cytoprotective properties. The medication is oral and should be swallowed whole. Administering with a high-fat, high-protein meal may decrease flushing and GI side effects. Possible adverse effects include skin flushing, pruritus, GI upset, albuminura, infection, lymphocytopenia, and increased liver transaminases. Cases of PML have been reported with severe, long-standing lymphopenia identified as the primary risk for PML. Dose adjustments for toxicities have been summarized above. No recommendations for any changes at this time.  Marene Shape, PharmD, BCACP, CPP Clinical  Pharmacist [facility] [phone]

## 2023-09-11 ENCOUNTER — Other Ambulatory Visit: Payer: Self-pay

## 2023-09-16 ENCOUNTER — Other Ambulatory Visit: Payer: Self-pay

## 2023-09-29 ENCOUNTER — Other Ambulatory Visit: Payer: Self-pay

## 2023-09-30 ENCOUNTER — Ambulatory Visit
Admission: RE | Admit: 2023-09-30 | Discharge: 2023-09-30 | Disposition: A | Discharge status: Home / Self Care | Source: Ambulatory Visit | Attending: Obstetrics and Gynecology | Admitting: Obstetrics and Gynecology

## 2023-09-30 ENCOUNTER — Ambulatory Visit: Payer: Self-pay | Admitting: Obstetrics and Gynecology

## 2023-09-30 ENCOUNTER — Encounter: Payer: Self-pay | Admitting: Obstetrics and Gynecology

## 2023-09-30 DIAGNOSIS — Z01419 Encounter for gynecological examination (general) (routine) without abnormal findings: Secondary | ICD-10-CM | POA: Diagnosis not present

## 2023-09-30 DIAGNOSIS — M8588 Other specified disorders of bone density and structure, other site: Secondary | ICD-10-CM | POA: Diagnosis not present

## 2023-09-30 DIAGNOSIS — Z780 Asymptomatic menopausal state: Secondary | ICD-10-CM | POA: Diagnosis not present

## 2023-09-30 DIAGNOSIS — E2839 Other primary ovarian failure: Secondary | ICD-10-CM | POA: Diagnosis not present

## 2023-10-02 ENCOUNTER — Other Ambulatory Visit: Payer: Self-pay

## 2023-10-05 ENCOUNTER — Other Ambulatory Visit: Payer: Self-pay

## 2023-10-05 ENCOUNTER — Other Ambulatory Visit (HOSPITAL_COMMUNITY): Payer: Self-pay

## 2023-10-05 NOTE — Progress Notes (Signed)
 Specialty Pharmacy Refill Coordination Note  Maria Jennings is a 53 y.o. female contacted today regarding refills of specialty medication(s) Dimethyl Fumarate    Patient requested Delivery   Delivery date: 10/07/23   Verified address: [address] [name], [place] [address]   Medication will be filled on 10/06/23.

## 2023-10-06 ENCOUNTER — Other Ambulatory Visit: Payer: Self-pay

## 2023-10-07 ENCOUNTER — Telehealth: Admitting: Physician Assistant

## 2023-10-07 DIAGNOSIS — L989 Disorder of the skin and subcutaneous tissue, unspecified: Secondary | ICD-10-CM | POA: Diagnosis not present

## 2023-10-07 DIAGNOSIS — W57XXXA Bitten or stung by nonvenomous insect and other nonvenomous arthropods, initial encounter: Secondary | ICD-10-CM | POA: Diagnosis not present

## 2023-10-07 MED ORDER — DOXYCYCLINE HYCLATE 100 MG PO CAPS
100.0000 mg | ORAL_CAPSULE | Freq: Two times a day (BID) | ORAL | 0 refills | Status: AC
Start: 2023-10-07 — End: 2023-10-15
  Filled 2023-10-07: qty 14, 7d supply, fill #0

## 2023-10-07 NOTE — Progress Notes (Signed)
 E-Visit for Tick Bite  Thank you for describing your tick bite, Here is how we plan to help! Based on the information that you shared with me it looks like you have A tick that bite that we will treat with a short course of doxycycline .  In most cases a tick bite is painless and does not itch.  Most tick bites in which the tick is quickly removed do not require prescriptions. Ticks can transmit several diseases if they are infected and remain attacked to your skin. Therefore the length that the tick was attached and any symptoms you have experienced after the bite are import to accurately develop your custom treatment plan. In most cases a single dose of doxycycline  may prevent the development of a more serious condition.  Based on your information I have prescribed a course of doxycycline    Which ticks  are associated with illness?  The Wood Tick (dog tick) is the size of a watermelon seed and can sometimes transmit [facility] spotted fever and [place]  tick fever.   The Deer Tick (black-legged tick) is between the size of a poppy seed (pin head) and an apple seed, and can sometimes transmit Lyme disease.  A brown to black tick with a white splotch on its back is likely a female Amblyomma americanum ([place] tick). This tick has been associated with Southern Tick Associated illness ( STARI)  Lyme disease has become the most common tick-borne illness in the [place] . The risk of Lyme disease following a recognized deer tick bite is estimated to be 1%.  The majority of cases of Lyme disease start with a bull's eye rash at the site of the tick bite. The rash can occur days to weeks (typically 7-10 days) after a tick bite. Treatment with antibiotics is indicated if this rash appears. Flu-like symptoms may accompany the rash, including: fever, chills, headaches, muscle aches, and fatigue. Removing ticks promptly may prevent tick borne disease.  What can be used to prevent Tick  Bites?  Insect repellant with at leas 20% DEET. Wearing long pants with sock and shoes. Avoiding tall grass and heavily wooded areas. Checking your skin after being outdoors. Shower with a washcloth after outdoor exposures.  HOME CARE ADVICE FOR TICK BITE  Wood Tick Removal:  Use a pair of tweezers and grasp the wood tick close to the skin (on its head). Pull the wood tick straight upward without twisting or crushing it. Maintain a steady pressure until it releases its grip.   If tweezers aren't available, use fingers, a loop of thread around the jaws, or a needle between the jaws for traction.  Note: covering the tick with petroleum jelly, nail polish or rubbing alcohol doesn't work. Neither does touching the tick with a hot or cold object. Tiny Deer Tick Removal:   Needs to be scraped off with a knife blade or credit card edge. Place tick in a sealed container (e.g. glass jar, zip lock plastic bag), in case your doctor wants to see it. Tick's Head Removal:  If the wood tick's head breaks off in the skin, it must be removed. Clean the skin. Then use a sterile needle to uncover the head and lift it out or scrape it off.  If a very small piece of the head remains, the skin will eventually slough it off. Antibiotic Ointment:  Wash the wound and your hands with soap and water after removal to prevent catching any tick disease.  Apply an over the  counter antibiotic ointment (e.g. bacitracin) to the bite once. Expected Course: Tick bites normally don't itch or hurt. That's why they often go unnoticed. Call Your Doctor If:  You can't remove the tick or the tick's head Fever, a severe head ache, or rash occur in the next 2 weeks Bite begins to look infected Lyme's disease is common in your area You have not had a tetanus in the last 10 years Your current symptoms become worse    MAKE SURE YOU  Understand these instructions. Will watch your condition. Will get help right away if you are  not doing well or get worse.    Thank you for choosing an e-visit.  Your e-visit answers were reviewed by a board certified advanced clinical practitioner to complete your personal care plan. Depending upon the condition, your plan could have included both over the counter or prescription medications.  Please review your pharmacy choice. Make sure the pharmacy is open so you can pick up prescription now. If there is a problem, you may contact your provider through [employer] and have the prescription routed to another pharmacy.  Your safety is important to us . If you have drug allergies check your prescription carefully.   For the next 24 hours you can use MyChart to ask questions about today's visit, request a non-urgent call back, or ask for a work or school excuse. You will get an email in the next two days asking about your experience. I hope that your e-visit has been valuable and will speed your recovery.  Approximately 5 minutes was spent documenting and reviewing patient's chart.

## 2023-10-08 ENCOUNTER — Other Ambulatory Visit: Payer: Self-pay

## 2023-10-13 ENCOUNTER — Other Ambulatory Visit: Payer: Self-pay

## 2023-10-28 ENCOUNTER — Other Ambulatory Visit: Payer: Self-pay

## 2023-10-30 ENCOUNTER — Other Ambulatory Visit: Payer: Self-pay

## 2023-10-30 ENCOUNTER — Other Ambulatory Visit: Payer: Self-pay | Admitting: Pharmacy Technician

## 2023-10-30 NOTE — Progress Notes (Signed)
 Specialty Pharmacy Refill Coordination Note  Maria Jennings is a 53 y.o. female contacted today regarding refills of specialty medication(s) Dimethyl Fumarate    Patient requested Delivery   Delivery date: 11/04/23   Verified address: [address]  [place]   Medication will be filled on 11/03/23.

## 2023-11-03 ENCOUNTER — Other Ambulatory Visit: Payer: Self-pay

## 2023-11-16 ENCOUNTER — Telehealth: Payer: Self-pay

## 2023-11-16 NOTE — Telephone Encounter (Signed)
 Copied from CRM #[id number]. Topic: Referral - Question >> Nov 16, 2023 10:51 AM Frederich PARAS wrote: Reason for CRM: pt calling in needs the referral directed to  [facility] with dr elwanda the   fax # [phone]

## 2023-11-19 ENCOUNTER — Other Ambulatory Visit: Payer: Self-pay | Admitting: Nurse Practitioner

## 2023-11-19 ENCOUNTER — Encounter: Payer: Self-pay | Admitting: Nurse Practitioner

## 2023-11-19 DIAGNOSIS — Z1211 Encounter for screening for malignant neoplasm of colon: Secondary | ICD-10-CM

## 2023-11-26 ENCOUNTER — Other Ambulatory Visit: Payer: Self-pay

## 2023-11-26 NOTE — Progress Notes (Signed)
 Specialty Pharmacy Refill Coordination Note  Maria Jennings is a 53 y.o. female contacted today regarding refills of specialty medication(s) Dimethyl Fumarate    Patient requested Delivery   Delivery date: 11/30/23   Verified address: [address]  [place] [address]   Medication will be filled on 11/27/23.

## 2023-12-15 DIAGNOSIS — D2271 Melanocytic nevi of right lower limb, including hip: Secondary | ICD-10-CM | POA: Diagnosis not present

## 2023-12-15 DIAGNOSIS — L821 Other seborrheic keratosis: Secondary | ICD-10-CM | POA: Diagnosis not present

## 2023-12-15 DIAGNOSIS — D225 Melanocytic nevi of trunk: Secondary | ICD-10-CM | POA: Diagnosis not present

## 2023-12-15 DIAGNOSIS — D2261 Melanocytic nevi of right upper limb, including shoulder: Secondary | ICD-10-CM | POA: Diagnosis not present

## 2023-12-15 DIAGNOSIS — D2262 Melanocytic nevi of left upper limb, including shoulder: Secondary | ICD-10-CM | POA: Diagnosis not present

## 2023-12-15 DIAGNOSIS — D2272 Melanocytic nevi of left lower limb, including hip: Secondary | ICD-10-CM | POA: Diagnosis not present

## 2023-12-15 DIAGNOSIS — L820 Inflamed seborrheic keratosis: Secondary | ICD-10-CM | POA: Diagnosis not present

## 2023-12-15 DIAGNOSIS — D485 Neoplasm of uncertain behavior of skin: Secondary | ICD-10-CM | POA: Diagnosis not present

## 2023-12-22 ENCOUNTER — Other Ambulatory Visit: Payer: Self-pay

## 2023-12-23 ENCOUNTER — Other Ambulatory Visit (HOSPITAL_COMMUNITY): Payer: Self-pay

## 2023-12-24 ENCOUNTER — Other Ambulatory Visit: Payer: Self-pay

## 2023-12-25 ENCOUNTER — Other Ambulatory Visit: Payer: Self-pay

## 2023-12-25 ENCOUNTER — Other Ambulatory Visit (HOSPITAL_COMMUNITY): Payer: Self-pay

## 2023-12-25 NOTE — Progress Notes (Signed)
 Specialty Pharmacy Refill Coordination Note  Maria Jennings is a 53 y.o. female contacted today regarding refills of specialty medication(s) Dimethyl Fumarate    Patient requested Delivery   Delivery date: 12/29/23   Verified address: [address]  [place] [address]   Medication will be filled on 12/28/23.

## 2023-12-28 ENCOUNTER — Encounter: Payer: Self-pay | Admitting: Nurse Practitioner

## 2023-12-28 ENCOUNTER — Other Ambulatory Visit: Payer: Self-pay

## 2023-12-28 ENCOUNTER — Other Ambulatory Visit (HOSPITAL_COMMUNITY): Payer: Self-pay

## 2023-12-31 ENCOUNTER — Other Ambulatory Visit: Payer: Self-pay | Admitting: Nurse Practitioner

## 2023-12-31 DIAGNOSIS — G35 Multiple sclerosis: Secondary | ICD-10-CM

## 2024-01-01 DIAGNOSIS — M542 Cervicalgia: Secondary | ICD-10-CM | POA: Diagnosis not present

## 2024-01-01 DIAGNOSIS — M5033 Other cervical disc degeneration, cervicothoracic region: Secondary | ICD-10-CM | POA: Diagnosis not present

## 2024-01-20 ENCOUNTER — Other Ambulatory Visit (HOSPITAL_COMMUNITY): Payer: Self-pay

## 2024-01-21 ENCOUNTER — Other Ambulatory Visit: Payer: Self-pay

## 2024-01-26 ENCOUNTER — Other Ambulatory Visit (HOSPITAL_COMMUNITY): Payer: Self-pay

## 2024-01-27 ENCOUNTER — Other Ambulatory Visit: Payer: Self-pay

## 2024-01-28 ENCOUNTER — Other Ambulatory Visit: Payer: Self-pay

## 2024-01-29 ENCOUNTER — Other Ambulatory Visit: Payer: Self-pay

## 2024-01-29 NOTE — Progress Notes (Signed)
 Specialty Pharmacy Refill Coordination Note  Maria Jennings is a 53 y.o. female contacted today regarding refills of specialty medication(s) Dimethyl Fumarate    Patient requested Delivery   Delivery date: 02/02/24   Verified address: [address]  [place] [address]   Medication will be filled on 02/01/24.

## 2024-01-29 NOTE — Progress Notes (Signed)
 Specialty Pharmacy Ongoing Clinical Assessment Note  Maria Jennings is a 53 y.o. female who is being followed by the specialty pharmacy service for RxSp Multiple Sclerosis   Patient's specialty medication(s) reviewed today: Dimethyl Fumarate    Missed doses in the last 4 weeks: 0   Patient/Caregiver did not have any additional questions or concerns.   Therapeutic benefit summary: Patient is achieving benefit   Adverse events/side effects summary: No adverse events/side effects   Patient's therapy is appropriate to: Continue    Goals Addressed             This Visit's Progress    Stabilization of disease   On track    Patient is on track. Patient will maintain adherence         Follow up: 12 months  [facility]

## 2024-02-01 ENCOUNTER — Other Ambulatory Visit: Payer: Self-pay

## 2024-02-24 ENCOUNTER — Other Ambulatory Visit: Payer: Self-pay

## 2024-02-26 ENCOUNTER — Other Ambulatory Visit: Payer: Self-pay

## 2024-03-01 ENCOUNTER — Other Ambulatory Visit (HOSPITAL_COMMUNITY): Payer: Self-pay

## 2024-03-03 ENCOUNTER — Other Ambulatory Visit: Payer: Self-pay

## 2024-03-03 NOTE — Progress Notes (Signed)
 Specialty Pharmacy Refill Coordination Note  Maria Jennings is a 53 y.o. female contacted today regarding refills of specialty medication(s) Dimethyl Fumarate    Patient requested Delivery   Delivery date: 03/07/24   Verified address: [address]  [facility] [place] [address]   Medication will be filled on: 03/04/24

## 2024-03-22 ENCOUNTER — Other Ambulatory Visit: Payer: Self-pay

## 2024-03-22 ENCOUNTER — Other Ambulatory Visit: Payer: Self-pay | Admitting: Pharmacist

## 2024-03-22 DIAGNOSIS — G35D Multiple sclerosis, unspecified: Secondary | ICD-10-CM | POA: Diagnosis not present

## 2024-03-22 DIAGNOSIS — E559 Vitamin D deficiency, unspecified: Secondary | ICD-10-CM | POA: Diagnosis not present

## 2024-03-22 DIAGNOSIS — E538 Deficiency of other specified B group vitamins: Secondary | ICD-10-CM | POA: Diagnosis not present

## 2024-03-22 MED ORDER — DIMETHYL FUMARATE 240 MG PO CPDR
DELAYED_RELEASE_CAPSULE | Freq: Two times a day (BID) | ORAL | 6 refills | Status: AC
  Filled 2024-03-22: qty 180, fill #0
  Filled 2024-03-25: qty 60, 30d supply, fill #0
  Filled 2024-04-28 – 2024-05-05 (×2): qty 60, 30d supply, fill #1

## 2024-03-22 MED ORDER — DIMETHYL FUMARATE 240 MG PO CPDR: DELAYED_RELEASE_CAPSULE | Freq: Two times a day (BID) | ORAL | 6 refills | Status: DC

## 2024-03-25 ENCOUNTER — Ambulatory Visit: Payer: Commercial Managed Care - PPO | Admitting: Obstetrics and Gynecology

## 2024-03-25 ENCOUNTER — Other Ambulatory Visit: Payer: Self-pay

## 2024-03-28 ENCOUNTER — Other Ambulatory Visit: Payer: Self-pay

## 2024-03-29 ENCOUNTER — Other Ambulatory Visit: Payer: Self-pay

## 2024-03-29 ENCOUNTER — Ambulatory Visit: Admitting: Obstetrics and Gynecology

## 2024-03-29 VITALS — BP 124/72 | HR 82 | Ht 67.32 in | Wt 198.4 lb

## 2024-03-29 DIAGNOSIS — Z1211 Encounter for screening for malignant neoplasm of colon: Secondary | ICD-10-CM

## 2024-03-29 DIAGNOSIS — Z9189 Other specified personal risk factors, not elsewhere classified: Secondary | ICD-10-CM

## 2024-03-29 DIAGNOSIS — Z01419 Encounter for gynecological examination (general) (routine) without abnormal findings: Secondary | ICD-10-CM

## 2024-03-29 DIAGNOSIS — Z1331 Encounter for screening for depression: Secondary | ICD-10-CM | POA: Diagnosis not present

## 2024-03-29 NOTE — Progress Notes (Signed)
 Specialty Pharmacy Refill Coordination Note  Maria Jennings is a 53 y.o. female contacted today regarding refills of specialty medication(s) Dimethyl Fumarate    Patient requested Delivery   Delivery date: 04/06/24   Verified address: [address]  [place] [address]   Medication will be filled on: 04/05/24

## 2024-03-29 NOTE — Progress Notes (Signed)
 53 y.o. y.o. female here for annual exam. Patient's last menstrual period was 11/25/2021.   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 2 children 21 and 20.  One is tour manager at [employer].  Other lives in [place].  No LMP recorded.          Sexually active: Yes.    The current method of family planning is none.    Exercising: Yes.    Hike, bike, paddle board, tennis  Smoker:  no  Health Maintenance: No HRT, denies any PMB Pap:   03/20/23 History of abnormal Pap:  no MMG:  08/26/23 BMD:  -1.5 Spine frax 5%, .1% 09/30/23 baseline ordered with h/o MS and steroid use. Repeat in 2 years Colonoscopy: nurse intake in jan for the procedure TDaP:  UTD per patient  Gardasil: n/a Cardiac calcium CT: order placed Has dermatologist for annual mole checks  Body mass index is 30.78 kg/m.     03/29/2024    3:08 PM 03/20/2023   10:04 AM 01/14/2023    9:05 AM  Depression screen PHQ 2/9  Decreased Interest 0 0 0  Down, Depressed, Hopeless 0 0 0  PHQ - 2 Score 0 0 0  Altered sleeping   0  Tired, decreased energy   0  Change in appetite   0  Feeling bad or failure about yourself    0  Trouble concentrating   0  Moving slowly or fidgety/restless   0  Suicidal thoughts   0  PHQ-9 Score   0   Difficult doing work/chores   Not difficult at all     Data saved with a previous flowsheet row definition    Blood pressure 124/72, pulse 82, height 5' 7.32 (1.71 m), weight 198 lb 6.4 oz (90 kg), last menstrual period 11/25/2021, SpO2 99%.     Component Value Date/Time   DIAGPAP  03/20/2023 1034    - Negative for intraepithelial lesion or malignancy (NILM)   DIAGPAP  11/05/2018 0000    NEGATIVE FOR INTRAEPITHELIAL LESIONS OR MALIGNANCY.   DIAGPAP  11/05/2018 0000    FUNGAL ORGANISMS PRESENT CONSISTENT WITH CANDIDA SPP.   HPVHIGH Negative 03/20/2023 1034   ADEQPAP  03/20/2023 1034    Satisfactory for evaluation. The presence or absence of an    ADEQPAP  03/20/2023 1034    endocervical/transformation zone component cannot be determined because   ADEQPAP of atrophy. 03/20/2023 1034    GYN HISTORY:    Component Value Date/Time   DIAGPAP  03/20/2023 1034    - Negative for intraepithelial lesion or malignancy (NILM)   DIAGPAP  11/05/2018 0000    NEGATIVE FOR INTRAEPITHELIAL LESIONS OR MALIGNANCY.   DIAGPAP  11/05/2018 0000    FUNGAL ORGANISMS PRESENT CONSISTENT WITH CANDIDA SPP.   HPVHIGH Negative 03/20/2023 1034   ADEQPAP  03/20/2023 1034    Satisfactory for evaluation. The presence or absence of an   ADEQPAP  03/20/2023 1034    endocervical/transformation zone component cannot be determined because   ADEQPAP of atrophy. 03/20/2023 1034    OB History  Gravida Para Term Preterm AB Living  2 2 2  0 0 2  SAB IAB Ectopic Multiple Live Births  0 0 0 0 2    # Outcome Date GA Lbr Len/2nd Weight Sex Type Anes PTL Lv  2 Term 03/2004 [redacted]w[redacted]d  6 lb (2.722 kg) F CS-Unspec   LIV  1 Term 02/2002 [redacted]w[redacted]d  6  lb (2.722 kg) F Vag-Spont   LIV    Past Medical History:  Diagnosis Date   Lumbago    Multiple sclerosis    symptom free 10 years   Osteopenia    2025 -1.5 spine normal frax repeat in 2 years   Sinusitis    Vitamin D  deficiency     Past Surgical History:  Procedure Laterality Date   CESAREAN SECTION     x 1   KNEE ARTHROSCOPY Right 07/2013    Medications Ordered Prior to Encounter[1]  Social History   Socioeconomic History   Marital status: Married    Spouse name: Not on file   Number of children: 2   Years of education: Not on file   Highest education level: Bachelor's degree (e.g., BA, AB, BS)  Occupational History    Employer: [employer]  Tobacco Use   Smoking status: Never   Smokeless tobacco: Never  Substance and Sexual Activity   Alcohol use: Yes    Alcohol/week: 5.0 standard drinks of alcohol    Types: 5 Glasses of wine per week   Drug use: No   Sexual activity: Yes    Partners: Male    Birth  control/protection: None  Other Topics Concern   Not on file  Social History Narrative   Lives in [place].      Work - Immunologist      Married       From [place]   Social Drivers of Health   Tobacco Use: Low Risk  (03/22/2024)   [facility]   Patient History    Smoking Tobacco Use: Never    Smokeless Tobacco Use: Never    Passive Exposure: Not on file  Financial Resource Strain: Low Risk (12/25/2022)   Overall Financial Resource Strain (CARDIA)    Difficulty of Paying Living Expenses: Not very hard  Food Insecurity: No Food Insecurity (12/25/2022)   Hunger Vital Sign    Worried About Running Out of Food in the Last Year: Never true    Ran Out of Food in the Last Year: Never true  Transportation Needs: No Transportation Needs (12/25/2022)   PRAPARE - Administrator, Civil Service (Medical): No    Lack of Transportation (Non-Medical): No  Physical Activity: Sufficiently Active (12/25/2022)   Exercise Vital Sign    Days of Exercise per Week: 7 days    Minutes of Exercise per Session: 40 min  Stress: No Stress Concern Present (12/25/2022)   [employer] of Occupational Health - Occupational Stress Questionnaire    Feeling of Stress : Not at all  Social Connections: Socially Integrated (12/25/2022)   Social Connection and Isolation Panel    Frequency of Communication with Friends and Family: More than three times a week    Frequency of Social Gatherings with Friends and Family: Once a week    Attends Religious Services: More than 4 times per year    Active Member of Clubs or Organizations: Yes    Attends Banker Meetings: More than 4 times per year    Marital Status: Married  Catering Manager Violence: Not on file  Depression (PHQ2-9): Low Risk (03/29/2024)   Depression (PHQ2-9)    PHQ-2 Score: 0  Alcohol Screen: Low Risk (12/25/2022)   Alcohol Screen    Last Alcohol Screening Score (AUDIT): 3  Housing: Unknown  (03/22/2024)   Received from [facility]   Epic    Unable to Pay for  Housing in the Last Year: Not on file    Number of Times Moved in the Last Year: Not on file    At any time in the past 12 months, were you homeless or living in a shelter (including now)?: No  Utilities: Not on file  Health Literacy: Not on file    Family History  Problem Relation Age of Onset   Hypertension Mother    Hyperlipidemia Mother    Hypertension Father    Cancer Maternal Grandmother 39       colon   Cancer Cousin 35       colon   Breast cancer Neg Hx      Allergies[2]    Patient's last menstrual period was Patient's last menstrual period was 11/25/2021.SABRA            Review of Systems Alls systems reviewed and are negative.     Physical Exam Constitutional:      Appearance: Normal appearance.  Genitourinary:     Vulva and urethral meatus normal.     No lesions in the vagina.     Right Labia: No rash, lesions or skin changes.    Left Labia: No lesions, skin changes or rash.    No vaginal discharge or tenderness.     No vaginal prolapse present.    No vaginal atrophy present.     Right Adnexa: not tender, not palpable and no mass present.    Left Adnexa: not tender, not palpable and no mass present.    No cervical motion tenderness or discharge.     Uterus is not enlarged, tender or irregular.  Breasts:    Right: Normal.     Left: Normal.  HENT:     Head: Normocephalic.  Neck:     Thyroid : No thyroid  mass, thyromegaly or thyroid  tenderness.  Cardiovascular:     Rate and Rhythm: Normal rate and regular rhythm.     Heart sounds: Normal heart sounds, S1 normal and S2 normal.  Pulmonary:     Effort: Pulmonary effort is normal.     Breath sounds: Normal breath sounds and air entry.  Abdominal:     General: There is no distension.     Palpations: Abdomen is soft. There is no mass.     Tenderness: There is no abdominal tenderness. There is no guarding or rebound.   Musculoskeletal:        General: Normal range of motion.     Cervical back: Full passive range of motion without pain, normal range of motion and neck supple. No tenderness.     Right lower leg: No edema.     Left lower leg: No edema.  Neurological:     Mental Status: She is alert.  Skin:    General: Skin is warm.  Psychiatric:        Mood and Affect: Mood normal.        Behavior: Behavior normal.        Thought Content: Thought content normal.  Vitals and nursing note reviewed. Exam conducted with a chaperone present.       A:         Well Woman GYN exam                             P:        Pap smear not indicated Encouraged annual mammogram screening Colon cancer screening referral placed today has intake in  Jan for procedure DXA up-to-date Labs and immunizations to do with PMD Counseled on cardiac CT score testing and patient desires- referral placed Encouraged healthy lifestyle practices Encouraged Vit D and Calcium   No follow-ups on file.  Almarie MARLA Carpen     [1]  Current Outpatient Medications on File Prior to Visit  Medication Sig Dispense Refill   Cholecalciferol (VITAMIN D3) 5000 units CAPS Take 1 capsule by mouth 2 (two) times a week.     cyanocobalamin  1000 MCG tablet Take 3,000 mcg by mouth 2 (two) times a week.      Dimethyl Fumarate  240 MG CPDR Take 1 tablet by mouth twice a day. 180 capsule 6   fexofenadine-pseudoephedrine  (ALLEGRA-D 24) 180-240 MG 24 hr tablet Take by mouth as needed.     fluticasone  (FLONASE ) 50 MCG/ACT nasal spray Place 2 sprays into both nostrils daily. 16 g 5   hydrocortisone  2.5 % cream Apply 1 Application topically 2 (two) times daily to affected areas on mouth angles, mixed with clotrimazole  cream as directed. 30 g 1   ibuprofen  (ADVIL ) 600 MG tablet Take 1 tablet (600 mg total) by mouth every 6 (six) hours as needed. 30 tablet 0   pseudoephedrine  (SUDAFED) 120 MG 12 hr tablet Take 1 tablet (120 mg total) by mouth 2 (two)  times daily as needed. take prior to flying or diving 30 tablet 5   oxymetazoline  (AFRIN 12 HOUR) 0.05 % nasal spray 6 puffs each nostril prior to flying or driving in mountains 30 mL 8   No current facility-administered medications on file prior to visit.  [2]  Allergies Allergen Reactions   Amoxicillin  Other (See Comments)   Azithromycin Hives   Ciprofloxacin Hcl Rash

## 2024-04-05 ENCOUNTER — Other Ambulatory Visit: Payer: Self-pay

## 2024-04-12 ENCOUNTER — Other Ambulatory Visit: Payer: Self-pay

## 2024-04-20 ENCOUNTER — Other Ambulatory Visit: Payer: Self-pay

## 2024-04-20 MED ORDER — NA SULFATE-K SULFATE-MG SULF 17.5-3.13-1.6 GM/177ML PO SOLN
ORAL | 0 refills | Status: AC
  Filled 2024-04-20 – 2024-05-18 (×2): qty 354, 1d supply, fill #0

## 2024-04-28 ENCOUNTER — Other Ambulatory Visit: Payer: Self-pay

## 2024-05-02 ENCOUNTER — Other Ambulatory Visit: Payer: Self-pay

## 2024-05-04 ENCOUNTER — Other Ambulatory Visit: Payer: Self-pay

## 2024-05-05 ENCOUNTER — Other Ambulatory Visit: Payer: Self-pay

## 2024-05-05 NOTE — Progress Notes (Signed)
 Specialty Pharmacy Refill Coordination Note  Maria Jennings is a 54 y.o. female contacted today regarding refills of specialty medication(s) Dimethyl Fumarate    Patient requested Delivery   Delivery date: 05/12/24   Verified address: [address]  [place] [address]   Medication will be filled on: 05/11/24

## 2024-05-11 ENCOUNTER — Other Ambulatory Visit: Payer: Self-pay

## 2024-05-12 ENCOUNTER — Telehealth: Payer: Self-pay

## 2024-05-12 ENCOUNTER — Other Ambulatory Visit: Payer: Self-pay

## 2024-05-12 NOTE — Telephone Encounter (Signed)
 Pharmacy Patient Advocate Encounter   Received notification from Pt Calls Messages that prior authorization for Dimethyl Fumarate  is required/requested.   Insurance verification completed.   The patient is insured through [facility].   Per test claim: PA required; PA submitted to above mentioned insurance via Latent Key/confirmation #/EOC [id number] Status is pending

## 2024-05-18 ENCOUNTER — Other Ambulatory Visit: Payer: Self-pay

## 2024-05-20 ENCOUNTER — Encounter: Payer: Self-pay | Admitting: Gastroenterology

## 2024-05-20 ENCOUNTER — Ambulatory Visit

## 2024-05-20 ENCOUNTER — Encounter: Admission: RE | Disposition: A | Payer: Self-pay | Source: Home / Self Care | Attending: Gastroenterology

## 2024-05-20 ENCOUNTER — Ambulatory Visit: Admit: 2024-05-20 | Admitting: Gastroenterology

## 2024-05-20 ENCOUNTER — Other Ambulatory Visit: Payer: Self-pay

## 2024-05-20 MED ORDER — LIDOCAINE HCL (CARDIAC) PF 100 MG/5ML IV SOSY
PREFILLED_SYRINGE | INTRAVENOUS | Status: DC | PRN
  Administered 2024-05-20: 100 mg via INTRAVENOUS

## 2024-05-20 MED ORDER — LIDOCAINE HCL (PF) 2 % IJ SOLN
INTRAMUSCULAR | Status: AC
  Filled 2024-05-20: qty 5

## 2024-05-20 MED ORDER — PROPOFOL 1000 MG/100ML IV EMUL
INTRAVENOUS | Status: AC
  Filled 2024-05-20: qty 100

## 2024-05-20 MED ORDER — SODIUM CHLORIDE 0.9 % IV SOLN: INTRAVENOUS | Status: DC

## 2024-05-20 MED ORDER — PROPOFOL 10 MG/ML IV BOLUS
INTRAVENOUS | Status: DC | PRN
  Administered 2024-05-20 (×2): 25 mg via INTRAVENOUS
  Administered 2024-05-20: 75 mg via INTRAVENOUS
  Administered 2024-05-20: 25 mg via INTRAVENOUS

## 2024-05-20 NOTE — Anesthesia Preprocedure Evaluation (Addendum)
"                                    Anesthesia Evaluation  Patient identified by MRN, date of birth, ID band Patient awake    Reviewed: Allergy & Precautions, NPO status , Patient's Chart, lab work & pertinent test results  Airway Mallampati: II  TM Distance: >3 FB Neck ROM: full    Dental  (+) Teeth Intact   Pulmonary neg pulmonary ROS   Pulmonary exam normal        Cardiovascular Exercise Tolerance: Good METS: negative cardio ROS Normal cardiovascular exam Rhythm:Regular Rate:Normal     Neuro/Psych MS negative neurological ROS  negative psych ROS   GI/Hepatic negative GI ROS, Neg liver ROS,,,  Endo/Other  negative endocrine ROS    Renal/GU negative Renal ROS  negative genitourinary   Musculoskeletal   Abdominal Normal abdominal exam  (+)   Peds negative pediatric ROS (+)  Hematology negative hematology ROS (+)   Anesthesia Other Findings Past Medical History: No date: Lumbago No date: Multiple sclerosis     Comment:  symptom free 10 years No date: Osteopenia     Comment:  2025 -1.5 spine normal frax repeat in 2 years No date: Sinusitis No date: Vitamin D  deficiency  Past Surgical History: No date: CESAREAN SECTION     Comment:  x 1 07/2013: KNEE ARTHROSCOPY; Right  BMI    Body Mass Index: 29.09 kg/m      Reproductive/Obstetrics negative OB ROS                              Anesthesia Physical Anesthesia Plan  ASA: 2  Anesthesia Plan: General   Post-op Pain Management:    Induction: Intravenous  PONV Risk Score and Plan: Propofol  infusion and TIVA  Airway Management Planned:   Additional Equipment:   Intra-op Plan:   Post-operative Plan:   Informed Consent: I have reviewed the patients History and Physical, chart, labs and discussed the procedure including the risks, benefits and alternatives for the proposed anesthesia with the patient or authorized representative who has indicated his/her  understanding and acceptance.     Dental Advisory Given  Plan Discussed with: CRNA  Anesthesia Plan Comments:          Anesthesia Quick Evaluation  "

## 2024-05-20 NOTE — Anesthesia Postprocedure Evaluation (Signed)
"   Anesthesia Post Note  Patient: Maria Jennings  Procedure(s) Performed: COLONOSCOPY  Patient location during evaluation: PACU Anesthesia Type: General Level of consciousness: awake Pain management: satisfactory to patient Vital Signs Assessment: post-procedure vital signs reviewed and stable Respiratory status: spontaneous breathing Cardiovascular status: stable Anesthetic complications: no   No notable events documented.   Last Vitals:  Vitals:   05/20/24 0812 05/20/24 0818  BP: (!) 107/91 125/78  Pulse: 65 64  Resp: 13 18  Temp:    SpO2: 99% 100%    Last Pain:  Vitals:   05/20/24 0818  TempSrc:   PainSc: 0-No pain                 VAN STAVEREN,[place] Shannahan      "

## 2024-05-20 NOTE — Op Note (Signed)
 [facility] Gastroenterology Patient Name: Maria Jennings Procedure Date: 05/20/2024 7:42 AM MRN: [id number] Account #: [id number] Date of Birth: Jul 26, 1970 Admit Type: Outpatient Age: 54 Room: [facility] ENDO ROOM 2 Gender: Female Note Status: Finalized Instrument Name: Colon Scope [id number] Procedure:             Colonoscopy Indications:           Screening for colorectal malignant neoplasm, This is                         the patient's first colonoscopy Providers:             Corinn Jess Brooklyn MD, MD Referring MD:          Leron Glance (Referring MD) Medicines:             General Anesthesia Complications:         No immediate complications. Estimated blood loss: None. Procedure:             Pre-Anesthesia Assessment:                        - Prior to the procedure, a History and Physical was                         performed, and patient medications and allergies were                         reviewed. The patient is competent. The risks and                         benefits of the procedure and the sedation options and                         risks were discussed with the patient. All questions                         were answered and informed consent was obtained.                         Patient identification and proposed procedure were                         verified by the physician, the nurse, the                         anesthesiologist, the anesthetist and the technician                         in the pre-procedure area in the procedure room in the                         endoscopy suite. Mental Status Examination: alert and                         oriented. Airway Examination: normal oropharyngeal                         airway and neck mobility. Respiratory Examination:  clear to auscultation. CV Examination: normal.                         Prophylactic Antibiotics: The patient does not require                         prophylactic  antibiotics. Prior Anticoagulants: The                         patient has taken no anticoagulant or antiplatelet                         agents. ASA Grade Assessment: II - A patient with mild                         systemic disease. After reviewing the risks and                         benefits, the patient was deemed in satisfactory                         condition to undergo the procedure. The anesthesia                         plan was to use general anesthesia. Immediately prior                         to administration of medications, the patient was                         re-assessed for adequacy to receive sedatives. The                         heart rate, respiratory rate, oxygen saturations,                         blood pressure, adequacy of pulmonary ventilation, and                         response to care were monitored throughout the                         procedure. The physical status of the patient was                         re-assessed after the procedure.                        After obtaining informed consent, the colonoscope was                         passed under direct vision. Throughout the procedure,                         the patient's blood pressure, pulse, and oxygen                         saturations were monitored continuously. The was  introduced through the anus and advanced to the the                         terminal ileum, with identification of the appendiceal                         orifice and IC valve. The colonoscopy was performed                         without difficulty. The patient tolerated the                         procedure well. The quality of the bowel preparation                         was evaluated using the BBPS [facility] Bowel Preparation                         Scale) with scores of: Right Colon = 3, Transverse                         Colon = 3 and Left Colon = 3 (entire mucosa seen well                          with no residual staining, small fragments of stool or                         opaque liquid). The total BBPS score equals 9. The                         terminal ileum, ileocecal valve, appendiceal orifice,                         and rectum were photographed. Findings:      The perianal and digital rectal examinations were normal. Pertinent       negatives include normal sphincter tone and no palpable rectal lesions.      The terminal ileum appeared normal.      Non-bleeding external hemorrhoids were found during retroflexion. The       hemorrhoids were medium-sized.      The exam was otherwise without abnormality. Impression:            - The examined portion of the ileum was normal.                        - Non-bleeding external hemorrhoids.                        - The examination was otherwise normal.                        - No specimens collected. Recommendation:        - Discharge patient to home (with escort).                        - Resume previous diet today.                        -  Continue present medications.                        - Repeat colonoscopy in 10 years for screening                         purposes. Procedure Code(s):     --- Professional ---                        H9878, Colorectal cancer screening; colonoscopy on                         individual not meeting criteria for high risk Diagnosis Code(s):     --- Professional ---                        Z12.11, Encounter for screening for malignant neoplasm                         of colon                        K64.4, Residual hemorrhoidal skin tags CPT copyright 2022 American Medical Association. All rights reserved. The codes documented in this report are preliminary and upon coder review may  be revised to meet current compliance requirements. Dr. Corinn Brooklyn Corinn Jess Brooklyn MD, MD 05/20/2024 8:02:35 AM This report has been signed electronically. Number of Addenda: 0 Note Initiated On: 05/20/2024  7:42 AM Scope Withdrawal Time: 0 hours 7 minutes 46 seconds  Total Procedure Duration: 0 hours 11 minutes 12 seconds  Estimated Blood Loss:  Estimated blood loss: none.      [facility]

## 2024-05-20 NOTE — Transfer of Care (Signed)
 Immediate Anesthesia Transfer of Care Note  Patient: Maria Jennings  Procedure(s) Performed: COLONOSCOPY  Patient Location: PACU and Endoscopy Unit  Anesthesia Type:General  Level of Consciousness: awake, alert , and oriented  Airway & Oxygen Therapy: Patient Spontanous Breathing and Patient connected to nasal cannula oxygen  Post-op Assessment: Report given to RN and Post -op Vital signs reviewed and stable  Post vital signs: Reviewed and stable  Last Vitals:  Vitals Value Taken Time  BP 97/57   Temp    Pulse 69 05/20/24 08:02  Resp 12 05/20/24 08:02  SpO2 99 % 05/20/24 08:02  Vitals shown include unfiled device data.  Last Pain:  Vitals:   05/20/24 0714  TempSrc: Temporal  PainSc: 0-No pain         Complications: No notable events documented.

## 2024-05-20 NOTE — H&P (Signed)
 "   Maria JONELLE Brooklyn, MD [facility] Gastroenterology, [address]  [place], [place] [address]  Main: [phone] Fax:  [phone] Pager: [phone]   Primary Care Physician:  Gretel App, NP Primary Gastroenterologist:  Dr. Corinn JONELLE Jennings  Pre-Procedure History & Physical: HPI:  Maria Jennings is a 54 y.o. female is here for an colonoscopy.   Past Medical History:  Diagnosis Date   Lumbago    Multiple sclerosis    symptom free 10 years   Osteopenia    2025 -1.5 spine normal frax repeat in 2 years   Sinusitis    Vitamin D  deficiency     Past Surgical History:  Procedure Laterality Date   CESAREAN SECTION     x 1   KNEE ARTHROSCOPY Right 07/2013    Prior to Admission medications  Medication Sig Start Date End Date Taking? Authorizing Provider  Cholecalciferol (VITAMIN D3) 5000 units CAPS Take 1 capsule by mouth 2 (two) times a week.   Yes [provider]  cyanocobalamin  1000 MCG tablet Take 3,000 mcg by mouth 2 (two) times a week.    Yes [provider]  Dimethyl Fumarate  240 MG CPDR Take 1 tablet by mouth twice a day. 03/22/24  Yes Jegede, Olugbemiga E, MD  fexofenadine-pseudoephedrine  (ALLEGRA-D 24) 180-240 MG 24 hr tablet Take by mouth as needed. 07/13/15  Yes [provider]  fluticasone  (FLONASE ) 50 MCG/ACT nasal spray Place 2 sprays into both nostrils daily. 07/07/23  Yes Gretel App, NP  hydrocortisone  2.5 % cream Apply 1 Application topically 2 (two) times daily to affected areas on mouth angles, mixed with clotrimazole  cream as directed. 12/23/22     ibuprofen  (ADVIL ) 600 MG tablet Take 1 tablet (600 mg total) by mouth every 6 (six) hours as needed. 12/14/22   Elnor Jayson LABOR, DO  Na Sulfate-K Sulfate-Mg Sulfate concentrate (SUPREP) 17.5-3.13-1.6 GM/177ML SOLN Take 2 Bottles (1 kit total) by mouth as directed One kit contains 2 bottles.  Take both bottles at the times instructed by your provider. Patient not taking: Reported  on 05/20/2024 04/20/24     oxymetazoline  (AFRIN 12 HOUR) 0.05 % nasal spray 6 puffs each nostril prior to flying or driving in mountains 87/7/75   Herminio Miu, MD  pseudoephedrine  (SUDAFED) 120 MG 12 hr tablet Take 1 tablet (120 mg total) by mouth 2 (two) times daily as needed. take prior to flying or diving 03/16/23       Allergies as of 04/20/2024 - Review Complete 03/29/2024  Allergen Reaction Noted   Amoxicillin  Other (See Comments) 11/04/2013   Azithromycin Hives 06/25/2011   Ciprofloxacin hcl Rash 06/25/2011    Family History  Problem Relation Age of Onset   Hypertension Mother    Hyperlipidemia Mother    Hypertension Father    Cancer Maternal Grandmother 57       colon   Cancer Cousin 27       colon   Breast cancer Neg Hx     Social History   Socioeconomic History   Marital status: Married    Spouse name: Not on file   Number of children: 2   Years of education: Not on file   Highest education level: Bachelor's degree (e.g., BA, AB, BS)  Occupational History    Employer: [employer]  Tobacco Use   Smoking status: Never   Smokeless tobacco: Never  Vaping Use   Vaping status: Never Used  Substance and Sexual Activity   Alcohol use: Yes  Alcohol/week: 5.0 standard drinks of alcohol    Types: 5 Glasses of wine per week   Drug use: No   Sexual activity: Yes    Partners: Male    Birth control/protection: None  Other Topics Concern   Not on file  Social History Narrative   Lives in [place].      Work - Immunologist      Married       From [place]   Social Drivers of Health   Tobacco Use: Low Risk (05/20/2024)   Patient History    Smoking Tobacco Use: Never    Smokeless Tobacco Use: Never    Passive Exposure: Not on file  Financial Resource Strain: Low Risk (12/25/2022)   Overall Financial Resource Strain (CARDIA)    Difficulty of Paying Living Expenses: Not very hard  Food Insecurity: No Food Insecurity (12/25/2022)   Hunger Vital Sign    Worried About  Running Out of Food in the Last Year: Never true    Ran Out of Food in the Last Year: Never true  Transportation Needs: No Transportation Needs (12/25/2022)   PRAPARE - Administrator, Civil Service (Medical): No    Lack of Transportation (Non-Medical): No  Physical Activity: Sufficiently Active (12/25/2022)   Exercise Vital Sign    Days of Exercise per Week: 7 days    Minutes of Exercise per Session: 40 min  Stress: No Stress Concern Present (12/25/2022)   [employer] of Occupational Health - Occupational Stress Questionnaire    Feeling of Stress : Not at all  Social Connections: Socially Integrated (12/25/2022)   Social Connection and Isolation Panel    Frequency of Communication with Friends and Family: More than three times a week    Frequency of Social Gatherings with Friends and Family: Once a week    Attends Religious Services: More than 4 times per year    Active Member of Clubs or Organizations: Yes    Attends Engineer, Structural: More than 4 times per year    Marital Status: Married  Catering Manager Violence: Not on file  Depression (PHQ2-9): Low Risk (03/29/2024)   Depression (PHQ2-9)    PHQ-2 Score: 0  Alcohol Screen: Low Risk (12/25/2022)   Alcohol Screen    Last Alcohol Screening Score (AUDIT): 3  Housing: Unknown (03/22/2024)   Received from [facility]   Epic    Unable to Pay for Housing in the Last Year: Not on file    Number of Times Moved in the Last Year: Not on file    At any time in the past 12 months, were you homeless or living in a shelter (including now)?: No  Utilities: Not on file  Health Literacy: Not on file    Review of Systems: See HPI, otherwise negative ROS  Physical Exam: BP (!) 139/98   Pulse 71   Temp (!) 97 F (36.1 C) (Temporal)   Resp 16   Ht 5' 9 (1.753 m)   Wt 89.4 kg   LMP 11/25/2021   SpO2 100%   BMI 29.09 kg/m  General:   Alert,  pleasant and cooperative in NAD Head:   Normocephalic and atraumatic. Neck:  Supple; no masses or thyromegaly. Lungs:  Clear throughout to auscultation.    Heart:  Regular rate and rhythm. Abdomen:  Soft, nontender and nondistended. Normal bowel sounds, without guarding, and without rebound.   Neurologic:  Alert and  oriented x4;  grossly normal neurologically.  Impression/Plan: Maria Jennings is here for a colonoscopy to be performed for colon cancer screening  Risks, benefits, limitations, and alternatives regarding  colonoscopy have been reviewed with the patient.  Questions have been answered.  All parties agreeable.   Maria Brooklyn, MD  05/20/2024, 7:42 AM "

## 2024-07-07 ENCOUNTER — Encounter: Admitting: Nurse Practitioner

## 2025-03-30 ENCOUNTER — Ambulatory Visit: Admitting: Obstetrics and Gynecology
